# Patient Record
Sex: Male | Born: 1960 | Race: Black or African American | Hispanic: No | Marital: Married | Smoking: Never smoker
Health system: Southern US, Community
[De-identification: ages and names within clinical notes are randomized; demographics above are authoritative.]

## PROBLEM LIST (undated history)

## (undated) DIAGNOSIS — Z9289 Personal history of other medical treatment: Secondary | ICD-10-CM

## (undated) DIAGNOSIS — N189 Chronic kidney disease, unspecified: Secondary | ICD-10-CM

## (undated) DIAGNOSIS — D649 Anemia, unspecified: Secondary | ICD-10-CM

## (undated) DIAGNOSIS — E119 Type 2 diabetes mellitus without complications: Secondary | ICD-10-CM

## (undated) DIAGNOSIS — I10 Essential (primary) hypertension: Secondary | ICD-10-CM

## (undated) HISTORY — DX: Chronic kidney disease, unspecified: N18.9

## (undated) HISTORY — DX: Anemia, unspecified: D64.9

## (undated) HISTORY — DX: Personal history of other medical treatment: Z92.89

## (undated) HISTORY — DX: Type 2 diabetes mellitus without complications: E11.9

---

## 2009-03-11 ENCOUNTER — Emergency Department (HOSPITAL_COMMUNITY): Admission: EM | Admit: 2009-03-11 | Discharge: 2009-03-11 | Payer: Self-pay | Admitting: Emergency Medicine

## 2010-08-03 ENCOUNTER — Encounter: Admission: RE | Admit: 2010-08-03 | Discharge: 2010-08-03 | Payer: Self-pay | Admitting: Internal Medicine

## 2010-12-28 LAB — POCT CARDIAC MARKERS

## 2010-12-28 LAB — DIFFERENTIAL
Basophils Absolute: 0 10*3/uL (ref 0.0–0.1)
Basophils Relative: 1 % (ref 0–1)
Eosinophils Absolute: 0.3 10*3/uL (ref 0.0–0.7)
Monocytes Absolute: 0.2 10*3/uL (ref 0.1–1.0)
Monocytes Relative: 5 % (ref 3–12)
Neutrophils Relative %: 66 % (ref 43–77)

## 2010-12-28 LAB — POCT I-STAT, CHEM 8
BUN: 32 mg/dL — ABNORMAL HIGH (ref 6–23)
Creatinine, Ser: 4 mg/dL — ABNORMAL HIGH (ref 0.4–1.5)
Potassium: 3.8 mEq/L (ref 3.5–5.1)
Sodium: 139 mEq/L (ref 135–145)

## 2010-12-28 LAB — CBC
MCHC: 33.2 g/dL (ref 30.0–36.0)
MCV: 100.8 fL — ABNORMAL HIGH (ref 78.0–100.0)
RBC: 4.37 MIL/uL (ref 4.22–5.81)
RDW: 12.6 % (ref 11.5–15.5)

## 2012-09-08 ENCOUNTER — Encounter (HOSPITAL_BASED_OUTPATIENT_CLINIC_OR_DEPARTMENT_OTHER): Payer: Self-pay

## 2012-10-06 ENCOUNTER — Encounter (HOSPITAL_BASED_OUTPATIENT_CLINIC_OR_DEPARTMENT_OTHER): Payer: Self-pay

## 2013-10-18 ENCOUNTER — Other Ambulatory Visit: Payer: Self-pay | Admitting: *Deleted

## 2013-10-18 DIAGNOSIS — Z01810 Encounter for preprocedural cardiovascular examination: Secondary | ICD-10-CM

## 2013-10-18 DIAGNOSIS — N186 End stage renal disease: Secondary | ICD-10-CM

## 2013-11-05 ENCOUNTER — Encounter: Payer: Self-pay | Admitting: Vascular Surgery

## 2013-11-06 ENCOUNTER — Encounter: Payer: Self-pay | Admitting: Vascular Surgery

## 2013-11-07 ENCOUNTER — Ambulatory Visit: Payer: Self-pay | Admitting: Vascular Surgery

## 2013-11-07 ENCOUNTER — Inpatient Hospital Stay (HOSPITAL_COMMUNITY): Admission: RE | Admit: 2013-11-07 | Payer: Self-pay | Source: Ambulatory Visit

## 2013-11-08 ENCOUNTER — Ambulatory Visit (INDEPENDENT_AMBULATORY_CARE_PROVIDER_SITE_OTHER)
Admission: RE | Admit: 2013-11-08 | Discharge: 2013-11-08 | Disposition: A | Discharge status: Home / Self Care | Payer: BC Managed Care – PPO | Source: Ambulatory Visit | Attending: Vascular Surgery | Admitting: Vascular Surgery

## 2013-11-08 ENCOUNTER — Ambulatory Visit (INDEPENDENT_AMBULATORY_CARE_PROVIDER_SITE_OTHER): Payer: BC Managed Care – PPO | Admitting: Vascular Surgery

## 2013-11-08 ENCOUNTER — Encounter: Payer: Self-pay | Admitting: Vascular Surgery

## 2013-11-08 ENCOUNTER — Ambulatory Visit (HOSPITAL_COMMUNITY)
Admission: RE | Admit: 2013-11-08 | Discharge: 2013-11-08 | Disposition: A | Discharge status: Home / Self Care | Payer: BC Managed Care – PPO | Source: Ambulatory Visit | Attending: Vascular Surgery | Admitting: Vascular Surgery

## 2013-11-08 VITALS — BP 147/80 | HR 76 | Ht 70.0 in | Wt 184.0 lb

## 2013-11-08 DIAGNOSIS — Z01810 Encounter for preprocedural cardiovascular examination: Secondary | ICD-10-CM

## 2013-11-08 DIAGNOSIS — N186 End stage renal disease: Secondary | ICD-10-CM

## 2013-11-08 DIAGNOSIS — Z01818 Encounter for other preprocedural examination: Secondary | ICD-10-CM | POA: Insufficient documentation

## 2013-11-08 NOTE — Progress Notes (Signed)
[facility] [place] HISTORY AND PHYSICAL   History of Present Illness:  Patient is a 53 y.o. year old male who presents for placement of a permanent hemodialysis access. The patient is right handed .  The patient is not currently on hemodialysis.  The cause of renal failure is thought to be secondary to hypertension.  Other chronic medical problems include diabetes. These are currently stable. The patient's nephrologist is Dr. Joelyn Oms.  Past Medical History  Diagnosis Date  . Diabetes mellitus without complication   . Chronic kidney disease     History reviewed. No pertinent past surgical history.   Social History History  Substance Use Topics  . Smoking status: Never Smoker   . Smokeless tobacco: Never Used  . Alcohol Use: No    Family History History reviewed. No pertinent family history.  Allergies  No Known Allergies   Current Outpatient Prescriptions  Medication Sig Dispense Refill  . AMLODIPINE BESYLATE PO Take 1 tablet by mouth daily.      Marland Kitchen CARVEDILOL PO Take by mouth.      . CLONIDINE HCL PO Take by mouth.      . Saxagliptin HCl (ONGLYZA PO) Take by mouth.       No current facility-administered medications for this visit.    ROS:   General:  No weight loss, Fever, chills  HEENT: No recent headaches, no nasal bleeding, no visual changes, no sore throat  Neurologic: No dizziness, blackouts, seizures. No recent symptoms of stroke or mini- stroke. No recent episodes of slurred speech, or temporary blindness.  Cardiac: No recent episodes of chest pain/pressure, no shortness of breath at rest.  No shortness of breath with exertion.  Denies history of atrial fibrillation or irregular heartbeat  Vascular: No history of rest pain in feet.  No history of claudication.  No history of non-healing ulcer, No history of DVT   Pulmonary: No home oxygen, no productive cough, no hemoptysis,  No asthma or wheezing  Musculoskeletal:  [ ]  Arthritis, [  ] Low back pain,  [ ]  Joint pain  Hematologic:No history of hypercoagulable state.  No history of easy bleeding.  No history of anemia  Gastrointestinal: No hematochezia or melena,  No gastroesophageal reflux, no trouble swallowing  Urinary: [x ] chronic Kidney disease, [ ]  on HD - [ ]  MWF or [ ]  TTHS, [ ]  Burning with urination, [ ]  Frequent urination, [ ]  Difficulty urinating;   Skin: No rashes  Psychological: No history of anxiety,  No history of depression   Physical Examination  Filed Vitals:   11/08/13 1121  BP: 147/80  Pulse: 76  Height: 5\' 10"  (1.778 m)  Weight: 184 lb (83.462 kg)  SpO2: 100%    Body mass index is 26.4 kg/(m^2).  General:  Alert and oriented, no acute distress HEENT: Normal Neck: No bruit or JVD Pulmonary: Clear to auscultation bilaterally Cardiac: Regular Rate and Rhythm without murmur Gastrointestinal: Soft, non-tender, non-distended, no mass Skin: No rash Extremity Pulses:  2+ radial, brachial pulses bilaterally Musculoskeletal: No deformity or edema  Neurologic: Upper and lower extremity motor 5/5 and symmetric  DATA: Patient had a vein mapping ultrasound today. The cephalic vein is small bilaterally. The left is less than 2 mm throughout its course. The right is slightly over 2 mm but has areas which are less than 2 mm. The right basilic vein is 0000000 mm. The left basilic vein is smaller   ASSESSMENT: Lengthy discussion with the patient today  regarding options of fistula versus graft as well as the physiology and operative details behind dialysis access procedures. The patient has marginal vein bilaterally. He may be a candidate for a right basilic vein transposition. However he may require a graft.   PLAN:  The patient was offered a right basilic vein transposition fistula today versus possible AV graft. However, he is currently reluctant to have access placed and thoroughly discusses things further with his nephrologist Dr. Joelyn Oms. The  patient will call us if he wishes to schedule a right basilic vein transposition fistula or a graft in the near future.  Ruta Hinds, MD [facility] [place] Office: [phone] Pager: [phone]

## 2016-05-21 DIAGNOSIS — Z9289 Personal history of other medical treatment: Secondary | ICD-10-CM

## 2016-05-21 HISTORY — DX: Personal history of other medical treatment: Z92.89

## 2016-06-07 ENCOUNTER — Inpatient Hospital Stay (HOSPITAL_COMMUNITY)
Admission: EM | Admit: 2016-06-07 | Discharge: 2016-06-10 | DRG: 674 | Disposition: A | Discharge status: Home / Self Care | Payer: BLUE CROSS/BLUE SHIELD | Attending: Internal Medicine | Admitting: Internal Medicine

## 2016-06-07 ENCOUNTER — Encounter (HOSPITAL_COMMUNITY): Payer: Self-pay | Admitting: Vascular Surgery

## 2016-06-07 DIAGNOSIS — N186 End stage renal disease: Secondary | ICD-10-CM | POA: Diagnosis not present

## 2016-06-07 DIAGNOSIS — I120 Hypertensive chronic kidney disease with stage 5 chronic kidney disease or end stage renal disease: Secondary | ICD-10-CM | POA: Diagnosis present

## 2016-06-07 DIAGNOSIS — N179 Acute kidney failure, unspecified: Secondary | ICD-10-CM | POA: Diagnosis not present

## 2016-06-07 DIAGNOSIS — E872 Acidosis: Secondary | ICD-10-CM | POA: Diagnosis present

## 2016-06-07 DIAGNOSIS — E1122 Type 2 diabetes mellitus with diabetic chronic kidney disease: Secondary | ICD-10-CM | POA: Diagnosis not present

## 2016-06-07 DIAGNOSIS — Z23 Encounter for immunization: Secondary | ICD-10-CM | POA: Diagnosis not present

## 2016-06-07 DIAGNOSIS — Z452 Encounter for adjustment and management of vascular access device: Secondary | ICD-10-CM | POA: Diagnosis not present

## 2016-06-07 DIAGNOSIS — E875 Hyperkalemia: Secondary | ICD-10-CM | POA: Diagnosis not present

## 2016-06-07 DIAGNOSIS — N19 Unspecified kidney failure: Secondary | ICD-10-CM | POA: Diagnosis not present

## 2016-06-07 DIAGNOSIS — I10 Essential (primary) hypertension: Secondary | ICD-10-CM | POA: Diagnosis present

## 2016-06-07 DIAGNOSIS — Z419 Encounter for procedure for purposes other than remedying health state, unspecified: Secondary | ICD-10-CM

## 2016-06-07 DIAGNOSIS — E1129 Type 2 diabetes mellitus with other diabetic kidney complication: Secondary | ICD-10-CM | POA: Diagnosis not present

## 2016-06-07 DIAGNOSIS — D649 Anemia, unspecified: Secondary | ICD-10-CM | POA: Diagnosis present

## 2016-06-07 DIAGNOSIS — N189 Chronic kidney disease, unspecified: Secondary | ICD-10-CM

## 2016-06-07 DIAGNOSIS — D631 Anemia in chronic kidney disease: Secondary | ICD-10-CM | POA: Diagnosis present

## 2016-06-07 DIAGNOSIS — Z833 Family history of diabetes mellitus: Secondary | ICD-10-CM

## 2016-06-07 DIAGNOSIS — Z95828 Presence of other vascular implants and grafts: Secondary | ICD-10-CM

## 2016-06-07 DIAGNOSIS — D638 Anemia in other chronic diseases classified elsewhere: Secondary | ICD-10-CM | POA: Diagnosis not present

## 2016-06-07 DIAGNOSIS — N2581 Secondary hyperparathyroidism of renal origin: Secondary | ICD-10-CM | POA: Diagnosis present

## 2016-06-07 DIAGNOSIS — Z992 Dependence on renal dialysis: Secondary | ICD-10-CM | POA: Diagnosis not present

## 2016-06-07 DIAGNOSIS — Z9119 Patient's noncompliance with other medical treatment and regimen: Secondary | ICD-10-CM

## 2016-06-07 DIAGNOSIS — N289 Disorder of kidney and ureter, unspecified: Secondary | ICD-10-CM | POA: Diagnosis not present

## 2016-06-07 DIAGNOSIS — M549 Dorsalgia, unspecified: Secondary | ICD-10-CM | POA: Diagnosis not present

## 2016-06-07 DIAGNOSIS — N185 Chronic kidney disease, stage 5: Secondary | ICD-10-CM | POA: Diagnosis not present

## 2016-06-07 DIAGNOSIS — I129 Hypertensive chronic kidney disease with stage 1 through stage 4 chronic kidney disease, or unspecified chronic kidney disease: Secondary | ICD-10-CM | POA: Diagnosis not present

## 2016-06-07 HISTORY — DX: Essential (primary) hypertension: I10

## 2016-06-07 LAB — RENAL FUNCTION PANEL
ANION GAP: 17 — AB (ref 5–15)
Albumin: 3.3 g/dL — ABNORMAL LOW (ref 3.5–5.0)
BUN: 159 mg/dL — ABNORMAL HIGH (ref 6–20)
CALCIUM: 6.9 mg/dL — AB (ref 8.9–10.3)
CO2: 12 mmol/L — ABNORMAL LOW (ref 22–32)
CREATININE: 32.26 mg/dL — AB (ref 0.61–1.24)
Chloride: 102 mmol/L (ref 101–111)
GFR, EST AFRICAN AMERICAN: 1 mL/min — AB (ref >60)
GFR, EST NON AFRICAN AMERICAN: 1 mL/min — AB (ref >60)
Glucose, Bld: 189 mg/dL — ABNORMAL HIGH (ref 65–99)
PHOSPHORUS: 6.7 mg/dL — AB (ref 2.5–4.6)
Potassium: 4.6 mmol/L (ref 3.5–5.1)
SODIUM: 131 mmol/L — AB (ref 135–145)

## 2016-06-07 LAB — COMPREHENSIVE METABOLIC PANEL
ALT: 14 U/L — AB (ref 17–63)
AST: 11 U/L — AB (ref 15–41)
Albumin: 3.4 g/dL — ABNORMAL LOW (ref 3.5–5.0)
Alkaline Phosphatase: 110 U/L (ref 38–126)
Anion gap: 16 — ABNORMAL HIGH (ref 5–15)
BUN: 160 mg/dL — AB (ref 6–20)
CHLORIDE: 105 mmol/L (ref 101–111)
CO2: 14 mmol/L — AB (ref 22–32)
CREATININE: 31 mg/dL — AB (ref 0.61–1.24)
Calcium: 7 mg/dL — ABNORMAL LOW (ref 8.9–10.3)
GFR calc Af Amer: 2 mL/min — ABNORMAL LOW (ref >60)
GFR calc non Af Amer: 1 mL/min — ABNORMAL LOW (ref >60)
Glucose, Bld: 125 mg/dL — ABNORMAL HIGH (ref 65–99)
Potassium: 4.5 mmol/L (ref 3.5–5.1)
SODIUM: 135 mmol/L (ref 135–145)
Total Bilirubin: 0.5 mg/dL (ref 0.3–1.2)
Total Protein: 7.5 g/dL (ref 6.5–8.1)

## 2016-06-07 LAB — URINE MICROSCOPIC-ADD ON
BACTERIA UA: NONE SEEN
RBC / HPF: NONE SEEN RBC/hpf (ref 0–5)
SQUAMOUS EPITHELIAL / LPF: NONE SEEN

## 2016-06-07 LAB — BASIC METABOLIC PANEL
ANION GAP: 17 — AB (ref 5–15)
BUN: 161 mg/dL — AB (ref 6–20)
CALCIUM: 6.6 mg/dL — AB (ref 8.9–10.3)
CO2: 13 mmol/L — ABNORMAL LOW (ref 22–32)
CREATININE: 31.7 mg/dL — AB (ref 0.61–1.24)
Chloride: 103 mmol/L (ref 101–111)
GFR calc Af Amer: 1 mL/min — ABNORMAL LOW (ref >60)
GFR, EST NON AFRICAN AMERICAN: 1 mL/min — AB (ref >60)
GLUCOSE: 164 mg/dL — AB (ref 65–99)
Potassium: 5 mmol/L (ref 3.5–5.1)
Sodium: 133 mmol/L — ABNORMAL LOW (ref 135–145)

## 2016-06-07 LAB — URINALYSIS, ROUTINE W REFLEX MICROSCOPIC
Bilirubin Urine: NEGATIVE
GLUCOSE, UA: 100 mg/dL — AB
KETONES UR: NEGATIVE mg/dL
Leukocytes, UA: NEGATIVE
Nitrite: NEGATIVE
Specific Gravity, Urine: 1.01 (ref 1.005–1.030)
pH: 5.5 (ref 5.0–8.0)

## 2016-06-07 LAB — PREPARE RBC (CROSSMATCH)

## 2016-06-07 LAB — SODIUM, URINE, RANDOM: SODIUM UR: 68 mmol/L

## 2016-06-07 LAB — GLUCOSE, CAPILLARY: GLUCOSE-CAPILLARY: 177 mg/dL — AB (ref 65–99)

## 2016-06-07 LAB — CBC
HCT: 20.2 % — ABNORMAL LOW (ref 39.0–52.0)
HCT: 20.3 % — ABNORMAL LOW (ref 39.0–52.0)
Hemoglobin: 6.7 g/dL — CL (ref 13.0–17.0)
Hemoglobin: 6.8 g/dL — CL (ref 13.0–17.0)
MCH: 31.5 pg (ref 26.0–34.0)
MCH: 31.5 pg (ref 26.0–34.0)
MCHC: 33.2 g/dL (ref 30.0–36.0)
MCHC: 33.5 g/dL (ref 30.0–36.0)
MCV: 94.8 fL (ref 78.0–100.0)
MCV: 94 fL (ref 78.0–100.0)
PLATELETS: 288 10*3/uL (ref 150–400)
PLATELETS: 255 10*3/uL (ref 150–400)
RBC: 2.13 MIL/uL — ABNORMAL LOW (ref 4.22–5.81)
RBC: 2.16 MIL/uL — AB (ref 4.22–5.81)
RDW: 12.6 % (ref 11.5–15.5)
RDW: 12.6 % (ref 11.5–15.5)
WBC: 5.9 10*3/uL (ref 4.0–10.5)
WBC: 6.1 10*3/uL (ref 4.0–10.5)

## 2016-06-07 LAB — ABO/RH: ABO/RH(D): AB POS

## 2016-06-07 LAB — CK: Total CK: 542 U/L — ABNORMAL HIGH (ref 49–397)

## 2016-06-07 LAB — CREATININE, URINE, RANDOM: Creatinine, Urine: 90.27 mg/dL

## 2016-06-07 LAB — POC OCCULT BLOOD, ED: Fecal Occult Bld: NEGATIVE

## 2016-06-07 LAB — LIPASE, BLOOD: LIPASE: 316 U/L — AB (ref 11–51)

## 2016-06-07 MED ORDER — SODIUM CHLORIDE 0.9 % IV SOLN
Freq: Once | INTRAVENOUS | Status: AC
  Administered 2016-06-07: 23:00:00 via INTRAVENOUS

## 2016-06-07 MED ORDER — ALTEPLASE 2 MG IJ SOLR: 2.0000 mg | Freq: Once | INTRAMUSCULAR | Status: DC | PRN

## 2016-06-07 MED ORDER — CALCITRIOL 0.5 MCG PO CAPS: 0.5000 ug | ORAL_CAPSULE | ORAL | Status: DC

## 2016-06-07 MED ORDER — DARBEPOETIN ALFA 100 MCG/0.5ML IJ SOSY
100.0000 ug | PREFILLED_SYRINGE | INTRAMUSCULAR | Status: DC
  Filled 2016-06-07: qty 0.5

## 2016-06-07 MED ORDER — SODIUM CHLORIDE 0.9 % IV SOLN: 100.0000 mL | INTRAVENOUS | Status: DC | PRN

## 2016-06-07 MED ORDER — HEPARIN SODIUM (PORCINE) 1000 UNIT/ML DIALYSIS: 1000.0000 [IU] | INTRAMUSCULAR | Status: DC | PRN

## 2016-06-07 MED ORDER — INSULIN ASPART 100 UNIT/ML ~~LOC~~ SOLN
0.0000 [IU] | Freq: Three times a day (TID) | SUBCUTANEOUS | Status: DC
  Administered 2016-06-09: 7 [IU] via SUBCUTANEOUS
  Administered 2016-06-09: 1 [IU] via SUBCUTANEOUS
  Administered 2016-06-10: 2 [IU] via SUBCUTANEOUS

## 2016-06-07 NOTE — H&P (Signed)
History and Physical    Jonathan Horton ZWC:[id number] DOB: 1961-01-20 DOA: 06/07/2016  PCP: Philis Fendt, MD  Patient coming from: Home  Chief Complaint: weakness and nausea  HPI: Jonathan Horton is a 55 y.o. male with medical history significant of DM and essential HTN.  Pt reports that he has had weakness over the past week which has been progressively getting worse. He also reports nausea and loss of appetite. The problem has been gradual but persistent. Nothing he is aware of makes it better. Given the worsening of the problem patient presented to the hospital for further evaluation recommendations.  ED Course:  Patient was found to have anemia of 6.7 and serum creatinine of 31.7 with BUN of 161. Nephrology was subsequently consulted and we are consulted for further medical evaluation recommendations  Review of Systems: As per HPI otherwise 10 point review of systems negative.   Past Medical History:  Diagnosis Date  . Chronic kidney disease   . Diabetes mellitus without complication ([place])   . Hypertension     History reviewed. No pertinent surgical history.   reports that he has never smoked. He has never used smokeless tobacco. He reports that he does not drink alcohol or use drugs.  No Known Allergies  Family history of diabetes   Prior to Admission medications   Medication Sig Start Date End Date Taking? Authorizing Provider  amLODipine (NORVASC) 10 MG tablet Take 10 mg by mouth daily. 05/01/16  Yes Historical Provider, MD  carvedilol (COREG) 25 MG tablet Take 25 mg by mouth 2 (two) times daily. 05/01/16  Yes Historical Provider, MD  cloNIDine (CATAPRES) 0.1 MG tablet Take 0.1 mg by mouth 2 (two) times daily. 05/01/16  Yes Historical Provider, MD  pantoprazole (PROTONIX) 40 MG tablet Take 40 mg by mouth daily. 05/01/16  Yes Historical Provider, MD  traMADol (ULTRAM) 50 MG tablet Take by mouth every 6 (six) hours as needed.    Historical Provider, MD    Physical  Exam: Vitals:   06/07/16 1310 06/07/16 1651 06/07/16 1715 06/07/16 1826  BP: 168/82 171/88 170/80 172/84  Pulse: 100 92 91 92  Resp: 17 20 15 16   Temp: 98.3 F (36.8 C)     TempSrc: Oral     SpO2: 100% 100% 100% 99%      Constitutional: NAD, calm, comfortable, in nad. Vitals:   06/07/16 1310 06/07/16 1651 06/07/16 1715 06/07/16 1826  BP: 168/82 171/88 170/80 172/84  Pulse: 100 92 91 92  Resp: 17 20 15 16   Temp: 98.3 F (36.8 C)     TempSrc: Oral     SpO2: 100% 100% 100% 99%   Eyes: PERRL, lids and conjunctivae normal ENMT: Mucous membranes are dry. Posterior pharynx clear of any exudate or lesions.Normal dentition.  Neck: normal, supple, no masses, no thyromegaly Respiratory: clear to auscultation bilaterally, no wheezing, no crackles. Normal respiratory effort. No accessory muscle use.  Cardiovascular: Regular rate and rhythm, no murmurs / rubs / gallops. No extremity edema. 2+ pedal pulses. No carotid bruits.  Abdomen: no tenderness, no masses palpated. No hepatosplenomegaly. Bowel sounds positive.  Musculoskeletal: no clubbing / cyanosis. No joint deformity upper and lower extremities. Good ROM, no contractures. Normal muscle tone.  Skin: no rashes, lesions, ulcers. No induration Neurologic: Pt non focal. Sensation intact,  Strength equal in all 4 extremities.  Psychiatric: Normal judgment and insight. Alert and oriented x 3. Normal mood.    Labs on Admission: I have personally reviewed following labs and  imaging studies  CBC:  Recent Labs Lab 06/07/16 1317  WBC 5.9  HGB 6.7*  HCT 20.2*  MCV 94.8  PLT 240   Basic Metabolic Panel:  Recent Labs Lab 06/07/16 1317 06/07/16 1722  NA 135 133*  K 4.5 5.0  CL 105 103  CO2 14* 13*  GLUCOSE 125* 164*  BUN 160* 161*  CREATININE 31.00* 31.70*  CALCIUM 7.0* 6.6*   GFR: CrCl cannot be calculated (Unknown ideal weight.). Liver Function Tests:  Recent Labs Lab 06/07/16 1317  AST 11*  ALT 14*  ALKPHOS 110   BILITOT 0.5  PROT 7.5  ALBUMIN 3.4*    Recent Labs Lab 06/07/16 1317  LIPASE 316*   No results for input(s): AMMONIA in the last 168 hours. Coagulation Profile: No results for input(s): INR, PROTIME in the last 168 hours. Cardiac Enzymes:  Recent Labs Lab 06/07/16 1722  CKTOTAL 542*   BNP (last 3 results) No results for input(s): PROBNP in the last 8760 hours. HbA1C: No results for input(s): HGBA1C in the last 72 hours. CBG: No results for input(s): GLUCAP in the last 168 hours. Lipid Profile: No results for input(s): CHOL, HDL, LDLCALC, TRIG, CHOLHDL, LDLDIRECT in the last 72 hours. Thyroid Function Tests: No results for input(s): TSH, T4TOTAL, FREET4, T3FREE, THYROIDAB in the last 72 hours. Anemia Panel: No results for input(s): VITAMINB12, FOLATE, FERRITIN, TIBC, IRON, RETICCTPCT in the last 72 hours. Urine analysis:    Component Value Date/Time   COLORURINE YELLOW 06/07/2016 [place] 06/07/2016 1727   LABSPEC 1.010 06/07/2016 1727   PHURINE 5.5 06/07/2016 1727   GLUCOSEU 100 (A) 06/07/2016 1727   HGBUR SMALL (A) 06/07/2016 1727   BILIRUBINUR NEGATIVE 06/07/2016 1727   KETONESUR NEGATIVE 06/07/2016 1727   PROTEINUR >300 (A) 06/07/2016 1727   NITRITE NEGATIVE 06/07/2016 1727   LEUKOCYTESUR NEGATIVE 06/07/2016 1727   Sepsis Labs: !!!!!!!!!!!!!!!!!!!!!!!!!!!!!!!!!!!!!!!!!!!! @LABRCNTIP (procalcitonin:4,lacticidven:4) )No results found for this or any previous visit (from the past 240 hour(s)).   Radiological Exams on Admission: No results found.  EKG: pending  Assessment/Plan Active Problems:   Renal failure/  Acute renal failure (ARF) ([place]) - New problem. Suspect acute on chronic.  - Nephrology consulted - Renal ultrasound - Obtain FE Na  Anemia - most likely due to renal failure - We'll transfuse 1 unit of packed red blood cells slowly.  - Should patient develop shortness of breath will discontinue and have nursing page  nephrology    Essential hypertension - continue home medication regimen    Diabetes mellitus ([place])  DVT prophylaxis: Heparin Code Status: Full Family Communication: d/c spouse at bedside Disposition Plan: Telemetry Consults called: Nephrology Admission status: inpatient   Velvet Bathe MD [facility] Pager [phone]  If 7PM-7AM, please contact night-coverage [email or web address] Password [facility]  06/07/2016, 6:33 PM

## 2016-06-07 NOTE — Consult Note (Signed)
Jonathan Horton Admit Date: 06/07/2016 06/07/2016 Rexene Agent Requesting Physician:  Wendee Beavers MD  Reason for Consult:  Renal Failure, Malaise, fatigue HPI:  44M seen at request of Dr Wendee Beavers for evaluation of renal failure.  He is known to me and was last seen by me in office 08/20/14.  At that time his SCr was 8.5.  He was not uremic. We had a repeat conversation where he was urged to proactively plan for impending symptomatic renal failure.  He chose against dialysis access. He stated he was working to lose some weight and he didn't think his kidneys would failed. I cautioned him that he would end up receiving 'crash start dialysis' if no active planning was pursued. He did not come to the scheduled f/u visits.    He presented to ED today with malaise, fatigue, cramps, and anorexia. Some nausea, vomiting.  No swelling in legs. No chest pain.  Labs are as below.  Noteworthy for profound progressive renal failure, anemia, metabolic acidosis, hypocalcemia.    Pt now willing to proceed with HD and vascular access.  He worries how it will impact his ability to return to [place] to visit.    Creatinine, Ser (mg/dL)  Date Value  06/07/2016 31.70 (H)  06/07/2016 31.00 (H)  03/11/2009 4.0 (H)  ] I/Os:  ROS Balance of 12 systems is negative w/ exceptions as above  PMH  Past Medical History:  Diagnosis Date  . Chronic kidney disease   . Diabetes mellitus without complication ([place])   . Hypertension    PSH History reviewed. No pertinent surgical history. FH No family history on file. SH  reports that he has never smoked. He has never used smokeless tobacco. He reports that he does not drink alcohol or use drugs. Allergies No Known Allergies Home medications Prior to Admission medications   Medication Sig Start Date End Date Taking? Authorizing Provider  amLODipine (NORVASC) 10 MG tablet Take 10 mg by mouth daily. 05/01/16  Yes Historical Provider, MD  carvedilol (COREG) 25 MG tablet Take 25 mg by  mouth 2 (two) times daily. 05/01/16  Yes Historical Provider, MD  cloNIDine (CATAPRES) 0.1 MG tablet Take 0.1 mg by mouth 2 (two) times daily. 05/01/16  Yes Historical Provider, MD  pantoprazole (PROTONIX) 40 MG tablet Take 40 mg by mouth daily. 05/01/16  Yes Historical Provider, MD  traMADol (ULTRAM) 50 MG tablet Take by mouth every 6 (six) hours as needed.    Historical Provider, MD    Current Medications Scheduled Meds: . [START ON 06/08/2016] insulin aspart  0-9 Units Subcutaneous TID WC   Continuous Infusions: . sodium chloride     PRN Meds:.  CBC  Recent Labs Lab 06/07/16 1317  WBC 5.9  HGB 6.7*  HCT 20.2*  MCV 94.8  PLT 025   Basic Metabolic Panel  Recent Labs Lab 06/07/16 1317 06/07/16 1722  NA 135 133*  K 4.5 5.0  CL 105 103  CO2 14* 13*  GLUCOSE 125* 164*  BUN 160* 161*  CREATININE 31.00* 31.70*  CALCIUM 7.0* 6.6*    Physical Exam  Blood pressure 165/71, pulse 92, temperature 98.3 F (36.8 C), temperature source Oral, resp. rate 16, SpO2 100 %. GEN: NAD ENT: uremic fetor,  EYES: EOMI CV: no rub. RRR. Nl s1s2. 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  1. New ESRD without vascular access, plan for iHD 2. Uremia 3. ANemia, severe 2/2 renal failure, ? Fe deficienc 4. Hyperkalemia, mild 5. Metabolic Acidosis, mild 6. HTN 7. DM2  Plan 1. Pt now agreeable to start HD 2. Have asked [facility] for assistance for [facility] + AVF 3. 1st HD tomorrow after TDC: 2h, 2K, 3.5Ca, Qb 200, No UF, No heparin 4. Clip patient 5. Check PTH 6. Check Fe Panel 7. Start VDRA for hypocalcemia 8. K and acidosis will correct with HD 9. 1 unit pRBC ordered already, can transfuse further in HD as needed 10. Start ESA with HD  Pearson Grippe MD [phone] pgr 06/07/2016, 7:11 PM

## 2016-06-07 NOTE — ED Triage Notes (Addendum)
Pt reports to the ED for eval of multiple complaints. States he has been having decreased PO intake, altered taste (x1.5 weeks), and N/V (x4 days). Pt also reports a yellow/white film to his tongue. Pt is a diabetic. Pt also reports bilateral shoulder pain and low back pain x 1 week as well. Denies any injury.

## 2016-06-07 NOTE — Consult Note (Signed)
   Patient name: Jonathan Horton MRN: [id number] DOB: [birth year] Sex: male    HPI: Zaeden Bohlman is a 55 y.o. male seen to discuss hemodialysis access. He had a long history of progressive renal insufficiency and his been noncompliant regarding recommendations for follow-up. He presents now in renal failure. Dr. Joelyn Oms requested hemodialysis catheter placement tomorrow and then planning for long-term access. The patient has had no prior renal access.  Past medical history is significant for diabetes and hypertension  Current Facility-Administered Medications  Medication Dose Route Frequency Provider Last Rate Last Dose  . 0.9 %  sodium chloride infusion   Intravenous Once Velvet Bathe, MD      . 0.9 %  sodium chloride infusion  100 mL Intravenous PRN Rexene Agent, MD      . 0.9 %  sodium chloride infusion  100 mL Intravenous PRN Rexene Agent, MD      . alteplase (CATHFLO ACTIVASE) injection 2 mg  2 mg Intracatheter Once PRN Rexene Agent, MD      . Derrill Memo ON 06/08/2016] calcitRIOL (ROCALTROL) capsule 0.5 mcg  0.5 mcg Oral Q T,Th,Sa-HD Rexene Agent, MD      . Derrill Memo ON 06/08/2016] Darbepoetin Alfa (ARANESP) injection 100 mcg  100 mcg Intravenous Q Tue-HD Rexene Agent, MD      . heparin injection 1,000 Units  1,000 Units Dialysis PRN Rexene Agent, MD      . Derrill Memo ON 06/08/2016] insulin aspart (novoLOG) injection 0-9 Units  0-9 Units Subcutaneous TID WC Velvet Bathe, MD         PHYSICAL EXAM: Vitals:   06/07/16 1845 06/07/16 1915 06/07/16 1945 06/07/16 2035  BP: 165/71 152/72 162/87 (!) 175/86  Pulse: 92 93 105 97  Resp: 16 15 19 18   Temp:    98.5 F (36.9 C)  TempSrc:    Oral  SpO2: 100% 99% 100% 100%  Weight:    177 lb 7.5 oz (80.5 kg)  Height:    5\' 10"  (1.778 m)    GENERAL: The patient is a well-nourished male, in no acute distress. The vital signs are documented above Respirations nonlabored. Abdomen soft and nontender. Pulse status: 2+  radial 2+ popliteal and 2+ dorsalis pedis pulses bilaterally  Does have the easily visible cephalic vein in his forearm on the left. Has an IV in his cephalic vein on the right  MEDICAL ISSUES: Acute renal failure on chronic renal failure. Will place hemodialysis catheter tomorrow. Discussed this at length with the patient and his family present. Also discussed the need for long-term access. He is right handed. Will obtain vein mapping but on physical exam does appear to be a good candidate for left cephalic vein to radial artery fistula.   Rosetta Posner, MD FACS [facility] Tel [phone] Pager [phone]

## 2016-06-07 NOTE — ED Provider Notes (Signed)
[facility] DEPT Provider Note   CSN: [id number] Arrival date & time: 06/07/16  1157   History   Chief Complaint Chief Complaint  Patient presents with  . Emesis  . Arm Pain  . Back Pain    HPI Jonathan Horton is a 55 y.o. male.  The history is provided by the patient, the spouse and medical records.   55 year old male with reported history of hypertension, diabetes, CKD presenting with myalgias. Onset about a week ago. Located in back and all extremities. Moderate to severe. Described as sore and stiff. Alleviated by taking BC powder. Patient states he takes [facility] powder very sparingly, at most one every few days. Associated with dark urine, nausea, and to episodes of nonbloody nonbilious emesis that occurred a week ago, but none since. Denies abdominal pain, fevers, palpitations, chest pain, shortness of breath, leg swelling. 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 Denies poor fluid intake or recent travel. Denies any recent changes to his medications in the last several months. Other than the occasional BC powder every few days, he does not take over-the-counter medications. He states he has elevated creatinine but this was last checked a couple years ago. Has never had dialysis.   Past Medical History:  Diagnosis Date  . Chronic kidney disease   . Diabetes mellitus without complication ([place])   . Hypertension     Patient Active Problem List   Diagnosis Date Noted  . Renal failure 06/07/2016  . End stage renal disease ([place]) 11/08/2013    History reviewed. No pertinent surgical history.     Home Medications    Prior to Admission medications   Medication Sig Start Date End Date Taking? Authorizing Provider  amLODipine (NORVASC) 10 MG tablet Take 10 mg by mouth daily. 05/01/16  Yes Historical Provider, MD  carvedilol (COREG) 25 MG tablet Take 25 mg by mouth 2 (two) times daily. 05/01/16  Yes Historical Provider, MD    cloNIDine (CATAPRES) 0.1 MG tablet Take 0.1 mg by mouth 2 (two) times daily. 05/01/16  Yes Historical Provider, MD  pantoprazole (PROTONIX) 40 MG tablet Take 40 mg by mouth daily. 05/01/16  Yes Historical Provider, MD    Family History No family history on file.  Social History Social History  Substance Use Topics  . Smoking status: Never Smoker  . Smokeless tobacco: Never Used  . Alcohol use No     Allergies   Review of patient's allergies indicates no known allergies.   Review of Systems Review of Systems  Constitutional: Negative for chills and fever.  Respiratory: Negative for cough and shortness of breath.   Cardiovascular: Negative for chest pain, palpitations and leg swelling.  Gastrointestinal: Positive for nausea and vomiting. Negative for abdominal pain.  Genitourinary: Negative for decreased urine volume.  Musculoskeletal: Positive for back pain and myalgias.  Skin: Negative for rash.  Hematological: Does not bruise/bleed easily.  Psychiatric/Behavioral: Negative for confusion.  All other systems reviewed and are negative.   Physical Exam Updated Vital Signs BP 170/80   Pulse 91   Temp 98.3 F (36.8 C) (Oral)   Resp 15   SpO2 100%   Physical Exam  Constitutional: He appears well-developed and well-nourished.  HENT:  Head: Normocephalic and atraumatic.  Eyes: Conjunctivae are normal.  Neck: Neck supple.  Cardiovascular: Normal rate and regular rhythm.   Pulmonary/Chest: Effort normal and breath sounds normal. No respiratory distress.  Abdominal: Soft. There is no  tenderness.  Musculoskeletal: He exhibits no edema or tenderness (nontender to palpation of back or extremities ).  Neurological: He is alert.  Skin: Skin is warm and dry.  Faint uremic frost, most noticeable on lower extremities   Psychiatric: He has a normal mood and affect.  Nursing note and vitals reviewed.   ED Treatments / Results  Labs (all labs ordered are listed, but only  abnormal results are displayed) Labs Reviewed  LIPASE, BLOOD - Abnormal; Notable for the following:       Result Value   Lipase 316 (*)    All other components within normal limits  COMPREHENSIVE METABOLIC PANEL - Abnormal; Notable for the following:    CO2 14 (*)    Glucose, Bld 125 (*)    BUN 160 (*)    Creatinine, Ser 31.00 (*)    Calcium 7.0 (*)    Albumin 3.4 (*)    AST 11 (*)    ALT 14 (*)    GFR calc non Af Amer 1 (*)    GFR calc Af Amer 2 (*)    Anion gap 16 (*)    All other components within normal limits  CBC - Abnormal; Notable for the following:    RBC 2.13 (*)    Hemoglobin 6.7 (*)    HCT 20.2 (*)    All other components within normal limits  URINALYSIS, ROUTINE W REFLEX MICROSCOPIC (NOT AT [facility])  CK  BASIC METABOLIC PANEL  POC OCCULT BLOOD, ED    EKG  EKG Interpretation None       Radiology No results found.  Procedures Procedures (including critical care time)  Medications Ordered in ED Medications - No data to display   Initial Impression / Assessment and Plan / ED Course  I have reviewed the triage vital signs and the nursing notes.  Pertinent labs & imaging results that were available during my care of the patient were reviewed by me and considered in my medical decision making (see chart for details).  Clinical Course    55 year old male with reported history of hypertension, diabetes, CKD presenting with myalgias, as above. He is afebrile, hypertensive, other vitals stable. Labs notable for CO2 14, normal potassium at 4.5, BUN 160, creatinine 31, hemoglobin 6.7, concerning for likely chronic worsening renal failure. He will need dialysis in the near future, though not emergently as he is not acutely fluid overloaded and does not have hyperkalemia. Discussed case with Dr. Joelyn Oms, nephrologist on-call. We'll admit for further care. Admitted to hospitalist service.  Case discussed with Dr. Sabra Heck who oversaw management of this  patient.    Final Clinical Impressions(s) / ED Diagnoses   Final diagnoses:  Renal failure    New Prescriptions New Prescriptions   No medications on file     Ivin Booty, MD 06/07/16 2123    Noemi Chapel, MD 06/07/16 2226

## 2016-06-07 NOTE — ED Provider Notes (Signed)
I saw and evaluated the patient, reviewed the resident's note and I agree with the findings and plan.  Pertinent History: The patient needed dialysis access 2 years ago because of worsening renal failure, he declined that at that time. He has been doing overall okay until approximately one week ago when he started becoming more fatigued and generally weak. He presents today with dark urine, generalized fatigue and weakness  Pertinent Exam findings: On exam the patient has a soft nontender abdomen, he has a slight uremic frost, he has no peripheral edema, no abdominal tenderness, clear lung sounds.  The patient's labs aren't significantly abnormal with acute renal failure, fulminant renal failure with a creatinine of 31 and a BUN of 160. Nephrology was consulted for dialysis and hospitalist for admission. There is no hypokalemia or signs of pulmonary edema    Final diagnoses:  Renal failure      Noemi Chapel, MD 06/07/16 2226

## 2016-06-08 ENCOUNTER — Inpatient Hospital Stay (HOSPITAL_COMMUNITY): Payer: BLUE CROSS/BLUE SHIELD

## 2016-06-08 ENCOUNTER — Inpatient Hospital Stay (HOSPITAL_COMMUNITY): Payer: BLUE CROSS/BLUE SHIELD | Admitting: Anesthesiology

## 2016-06-08 ENCOUNTER — Other Ambulatory Visit: Payer: Self-pay | Admitting: *Deleted

## 2016-06-08 ENCOUNTER — Encounter (HOSPITAL_COMMUNITY)
Admission: EM | Disposition: A | Discharge status: Home / Self Care | Payer: Self-pay | Source: Home / Self Care | Attending: Family Medicine

## 2016-06-08 DIAGNOSIS — Z4931 Encounter for adequacy testing for hemodialysis: Secondary | ICD-10-CM

## 2016-06-08 DIAGNOSIS — N186 End stage renal disease: Secondary | ICD-10-CM

## 2016-06-08 HISTORY — PX: INSERTION OF DIALYSIS CATHETER: SHX1324

## 2016-06-08 HISTORY — PX: AV FISTULA PLACEMENT: SHX1204

## 2016-06-08 LAB — HEPATITIS B SURFACE ANTIGEN: Hepatitis B Surface Ag: NEGATIVE

## 2016-06-08 LAB — BASIC METABOLIC PANEL
Anion gap: 17 — ABNORMAL HIGH (ref 5–15)
BUN: 161 mg/dL — ABNORMAL HIGH (ref 6–20)
CHLORIDE: 102 mmol/L (ref 101–111)
CO2: 14 mmol/L — ABNORMAL LOW (ref 22–32)
Calcium: 7 mg/dL — ABNORMAL LOW (ref 8.9–10.3)
Creatinine, Ser: 31.95 mg/dL — ABNORMAL HIGH (ref 0.61–1.24)
GFR calc non Af Amer: 1 mL/min — ABNORMAL LOW (ref >60)
GFR, EST AFRICAN AMERICAN: 1 mL/min — AB (ref >60)
Glucose, Bld: 275 mg/dL — ABNORMAL HIGH (ref 65–99)
POTASSIUM: 4.9 mmol/L (ref 3.5–5.1)
SODIUM: 133 mmol/L — AB (ref 135–145)

## 2016-06-08 LAB — CBC
HEMATOCRIT: 21.9 % — AB (ref 39.0–52.0)
HEMOGLOBIN: 7.5 g/dL — AB (ref 13.0–17.0)
MCH: 30.6 pg (ref 26.0–34.0)
MCHC: 34.2 g/dL (ref 30.0–36.0)
MCV: 89.4 fL (ref 78.0–100.0)
Platelets: 234 10*3/uL (ref 150–400)
RBC: 2.45 MIL/uL — AB (ref 4.22–5.81)
RDW: 16.6 % — ABNORMAL HIGH (ref 11.5–15.5)
WBC: 5.8 10*3/uL (ref 4.0–10.5)

## 2016-06-08 LAB — IRON AND TIBC
IRON: 72 ug/dL (ref 45–182)
SATURATION RATIOS: 32 % (ref 17.9–39.5)
TIBC: 224 ug/dL — AB (ref 250–450)
UIBC: 152 ug/dL

## 2016-06-08 LAB — FERRITIN: Ferritin: 206 ng/mL (ref 24–336)

## 2016-06-08 LAB — SURGICAL PCR SCREEN
MRSA, PCR: NEGATIVE
Staphylococcus aureus: POSITIVE — AB

## 2016-06-08 LAB — GLUCOSE, CAPILLARY
GLUCOSE-CAPILLARY: 97 mg/dL (ref 65–99)
GLUCOSE-CAPILLARY: 106 mg/dL — AB (ref 65–99)
Glucose-Capillary: 183 mg/dL — ABNORMAL HIGH (ref 65–99)

## 2016-06-08 SURGERY — INSERTION OF DIALYSIS CATHETER
Anesthesia: Monitor Anesthesia Care | Site: Chest | Laterality: Right

## 2016-06-08 MED ORDER — PROPOFOL 10 MG/ML IV BOLUS
INTRAVENOUS | Status: AC
  Filled 2016-06-08: qty 20

## 2016-06-08 MED ORDER — SODIUM CHLORIDE 0.9 % IV SOLN
INTRAVENOUS | Status: DC
  Administered 2016-06-08: 10:00:00 via INTRAVENOUS

## 2016-06-08 MED ORDER — INFLUENZA VAC SPLIT QUAD 0.5 ML IM SUSY
0.5000 mL | PREFILLED_SYRINGE | INTRAMUSCULAR | Status: AC
  Administered 2016-06-09: 0.5 mL via INTRAMUSCULAR
  Filled 2016-06-08: qty 0.5

## 2016-06-08 MED ORDER — PANTOPRAZOLE SODIUM 40 MG PO TBEC
40.0000 mg | DELAYED_RELEASE_TABLET | Freq: Every day | ORAL | Status: DC
  Administered 2016-06-09 – 2016-06-10 (×2): 40 mg via ORAL
  Filled 2016-06-08 (×2): qty 1

## 2016-06-08 MED ORDER — CHLORHEXIDINE GLUCONATE CLOTH 2 % EX PADS
6.0000 | MEDICATED_PAD | Freq: Every day | CUTANEOUS | Status: DC
  Administered 2016-06-08 – 2016-06-10 (×3): 6 via TOPICAL

## 2016-06-08 MED ORDER — IOPAMIDOL (ISOVUE-300) INJECTION 61%
INTRAVENOUS | Status: AC
  Filled 2016-06-08: qty 50

## 2016-06-08 MED ORDER — CALCIUM ACETATE (PHOS BINDER) 667 MG PO CAPS
667.0000 mg | ORAL_CAPSULE | Freq: Three times a day (TID) | ORAL | Status: DC
  Administered 2016-06-08 – 2016-06-10 (×4): 667 mg via ORAL
  Filled 2016-06-08 (×4): qty 1

## 2016-06-08 MED ORDER — DEXTROSE 5 % IV SOLN
1.5000 g | Freq: Once | INTRAVENOUS | Status: AC
  Administered 2016-06-08: 1.5 g via INTRAVENOUS
  Filled 2016-06-08: qty 1.5

## 2016-06-08 MED ORDER — HEPARIN SODIUM (PORCINE) 1000 UNIT/ML IJ SOLN
INTRAMUSCULAR | Status: DC | PRN
  Administered 2016-06-08: 4000 [IU] via INTRAVENOUS

## 2016-06-08 MED ORDER — HYDRALAZINE HCL 20 MG/ML IJ SOLN
5.0000 mg | Freq: Four times a day (QID) | INTRAMUSCULAR | Status: DC | PRN
  Administered 2016-06-08: 5 mg via INTRAVENOUS
  Filled 2016-06-08: qty 1

## 2016-06-08 MED ORDER — PHENYLEPHRINE 40 MCG/ML (10ML) SYRINGE FOR IV PUSH (FOR BLOOD PRESSURE SUPPORT)
PREFILLED_SYRINGE | INTRAVENOUS | Status: AC
  Filled 2016-06-08: qty 10

## 2016-06-08 MED ORDER — SODIUM CHLORIDE 0.9% FLUSH
3.0000 mL | Freq: Two times a day (BID) | INTRAVENOUS | Status: DC
  Administered 2016-06-08 – 2016-06-09 (×2): 3 mL via INTRAVENOUS

## 2016-06-08 MED ORDER — SODIUM CHLORIDE 0.9 % IV SOLN
INTRAVENOUS | Status: DC | PRN
  Administered 2016-06-08: 500 mL

## 2016-06-08 MED ORDER — HEPARIN SODIUM (PORCINE) 1000 UNIT/ML IJ SOLN
INTRAMUSCULAR | Status: AC
  Filled 2016-06-08: qty 1

## 2016-06-08 MED ORDER — PHENYLEPHRINE HCL 10 MG/ML IJ SOLN
INTRAMUSCULAR | Status: DC | PRN
  Administered 2016-06-08 (×2): 80 ug via INTRAVENOUS

## 2016-06-08 MED ORDER — AMLODIPINE BESYLATE 10 MG PO TABS
10.0000 mg | ORAL_TABLET | Freq: Every day | ORAL | Status: DC
  Administered 2016-06-09 – 2016-06-10 (×2): 10 mg via ORAL
  Filled 2016-06-08 (×2): qty 1

## 2016-06-08 MED ORDER — MIDAZOLAM HCL 2 MG/2ML IJ SOLN
INTRAMUSCULAR | Status: AC
  Filled 2016-06-08: qty 2

## 2016-06-08 MED ORDER — RENA-VITE PO TABS
1.0000 | ORAL_TABLET | Freq: Every day | ORAL | Status: DC
Start: 2016-06-08 — End: 2016-06-10
  Administered 2016-06-08 – 2016-06-09 (×2): 1 via ORAL
  Filled 2016-06-08 (×2): qty 1

## 2016-06-08 MED ORDER — HEPARIN SODIUM (PORCINE) 5000 UNIT/ML IJ SOLN
5000.0000 [IU] | Freq: Three times a day (TID) | INTRAMUSCULAR | Status: DC
  Administered 2016-06-08 – 2016-06-09 (×4): 5000 [IU] via SUBCUTANEOUS
  Filled 2016-06-08 (×4): qty 1

## 2016-06-08 MED ORDER — PNEUMOCOCCAL VAC POLYVALENT 25 MCG/0.5ML IJ INJ
0.5000 mL | INJECTION | INTRAMUSCULAR | Status: AC
  Administered 2016-06-09: 0.5 mL via INTRAMUSCULAR
  Filled 2016-06-08: qty 0.5

## 2016-06-08 MED ORDER — SODIUM CHLORIDE 0.9 % IV SOLN
250.0000 mL | INTRAVENOUS | Status: DC | PRN
  Administered 2016-06-08 (×2): via INTRAVENOUS

## 2016-06-08 MED ORDER — CARVEDILOL 25 MG PO TABS
25.0000 mg | ORAL_TABLET | Freq: Two times a day (BID) | ORAL | Status: DC
  Administered 2016-06-08 – 2016-06-10 (×5): 25 mg via ORAL
  Filled 2016-06-08 (×5): qty 1

## 2016-06-08 MED ORDER — PROTAMINE SULFATE 10 MG/ML IV SOLN
INTRAVENOUS | Status: DC | PRN
  Administered 2016-06-08: 20 mg via INTRAVENOUS

## 2016-06-08 MED ORDER — PROPOFOL 500 MG/50ML IV EMUL
INTRAVENOUS | Status: DC | PRN
  Administered 2016-06-08: 50 ug/kg/min via INTRAVENOUS

## 2016-06-08 MED ORDER — FENTANYL CITRATE (PF) 100 MCG/2ML IJ SOLN
INTRAMUSCULAR | Status: AC
  Filled 2016-06-08: qty 2

## 2016-06-08 MED ORDER — LIDOCAINE 2% (20 MG/ML) 5 ML SYRINGE
INTRAMUSCULAR | Status: AC
  Filled 2016-06-08: qty 15

## 2016-06-08 MED ORDER — CLONIDINE HCL 0.1 MG PO TABS
0.1000 mg | ORAL_TABLET | Freq: Two times a day (BID) | ORAL | Status: DC
  Administered 2016-06-08 – 2016-06-10 (×5): 0.1 mg via ORAL
  Filled 2016-06-08 (×5): qty 1

## 2016-06-08 MED ORDER — HEPARIN SODIUM (PORCINE) 1000 UNIT/ML IJ SOLN
INTRAMUSCULAR | Status: DC | PRN
  Administered 2016-06-08: 3.4 mL

## 2016-06-08 MED ORDER — MUPIROCIN 2 % EX OINT
1.0000 "application " | TOPICAL_OINTMENT | Freq: Two times a day (BID) | CUTANEOUS | Status: DC
  Administered 2016-06-08 – 2016-06-10 (×6): 1 via NASAL
  Filled 2016-06-08 (×2): qty 22

## 2016-06-08 MED ORDER — SODIUM CHLORIDE 0.9% FLUSH: 3.0000 mL | INTRAVENOUS | Status: DC | PRN

## 2016-06-08 MED ORDER — OXYCODONE HCL 5 MG PO TABS: 5.0000 mg | ORAL_TABLET | Freq: Four times a day (QID) | ORAL | Status: DC | PRN

## 2016-06-08 MED ORDER — TRAMADOL HCL 50 MG PO TABS: 50.0000 mg | ORAL_TABLET | Freq: Four times a day (QID) | ORAL | Status: DC | PRN

## 2016-06-08 MED ORDER — EPHEDRINE SULFATE 50 MG/ML IJ SOLN
INTRAMUSCULAR | Status: DC | PRN
  Administered 2016-06-08: 5 mg via INTRAVENOUS

## 2016-06-08 MED ORDER — LIDOCAINE-EPINEPHRINE (PF) 1 %-1:200000 IJ SOLN
INTRAMUSCULAR | Status: AC
  Filled 2016-06-08: qty 30

## 2016-06-08 MED ORDER — LIDOCAINE-EPINEPHRINE (PF) 1 %-1:200000 IJ SOLN
INTRAMUSCULAR | Status: DC | PRN
  Administered 2016-06-08: 10 mL

## 2016-06-08 MED ORDER — FENTANYL CITRATE (PF) 100 MCG/2ML IJ SOLN
INTRAMUSCULAR | Status: DC | PRN
  Administered 2016-06-08: 50 ug via INTRAVENOUS
  Administered 2016-06-08 (×2): 25 ug via INTRAVENOUS

## 2016-06-08 MED ORDER — 0.9 % SODIUM CHLORIDE (POUR BTL) OPTIME
TOPICAL | Status: DC | PRN
  Administered 2016-06-08: 1000 mL

## 2016-06-08 MED ORDER — OXYCODONE-ACETAMINOPHEN 5-325 MG PO TABS
1.0000 | ORAL_TABLET | ORAL | Status: DC | PRN
  Administered 2016-06-08 – 2016-06-09 (×2): 1 via ORAL
  Administered 2016-06-09: 2 via ORAL
  Filled 2016-06-08: qty 2
  Filled 2016-06-08 (×2): qty 1

## 2016-06-08 MED ORDER — SODIUM CHLORIDE 0.9% FLUSH
3.0000 mL | Freq: Two times a day (BID) | INTRAVENOUS | Status: DC
  Administered 2016-06-08 – 2016-06-10 (×5): 3 mL via INTRAVENOUS

## 2016-06-08 MED ORDER — EPHEDRINE 5 MG/ML INJ
INTRAVENOUS | Status: AC
  Filled 2016-06-08: qty 20

## 2016-06-08 MED ORDER — MIDAZOLAM HCL 5 MG/5ML IJ SOLN
INTRAMUSCULAR | Status: DC | PRN
  Administered 2016-06-08: 2 mg via INTRAVENOUS

## 2016-06-08 SURGICAL SUPPLY — 46 items
BAG DECANTER FOR FLEXI CONT (MISCELLANEOUS) ×4 IMPLANT
BIOPATCH RED 1 DISK 7.0 (GAUZE/BANDAGES/DRESSINGS) ×3 IMPLANT
BIOPATCH RED 1IN DISK 7.0MM (GAUZE/BANDAGES/DRESSINGS) ×1
CATH PALINDROME RT-P 15FX19CM (CATHETERS) IMPLANT
CATH PALINDROME RT-P 15FX23CM (CATHETERS) ×2 IMPLANT
CATH PALINDROME RT-P 15FX28CM (CATHETERS) IMPLANT
CATH PALINDROME RT-P 15FX55CM (CATHETERS) IMPLANT
CHLORAPREP W/TINT 26ML (MISCELLANEOUS) ×4 IMPLANT
CLIP TI MEDIUM 6 (CLIP) ×2 IMPLANT
CLIP TI WIDE RED SMALL 6 (CLIP) ×4 IMPLANT
COVER PROBE W GEL 5X96 (DRAPES) ×2 IMPLANT
COVER SURGICAL LIGHT HANDLE (MISCELLANEOUS) ×4 IMPLANT
DRAPE C-ARM 42X72 X-RAY (DRAPES) ×4 IMPLANT
DRAPE CHEST BREAST 15X10 FENES (DRAPES) ×4 IMPLANT
GAUZE SPONGE 2X2 8PLY STRL LF (GAUZE/BANDAGES/DRESSINGS) IMPLANT
GAUZE SPONGE 4X4 16PLY XRAY LF (GAUZE/BANDAGES/DRESSINGS) ×4 IMPLANT
GLOVE BIO SURGEON STRL SZ7.5 (GLOVE) ×6 IMPLANT
GLOVE BIOGEL PI IND STRL 8 (GLOVE) ×2 IMPLANT
GLOVE BIOGEL PI INDICATOR 8 (GLOVE) ×2
GOWN STRL REUS W/ TWL LRG LVL3 (GOWN DISPOSABLE) ×4 IMPLANT
GOWN STRL REUS W/TWL LRG LVL3 (GOWN DISPOSABLE) ×8
KIT BASIN OR (CUSTOM PROCEDURE TRAY) ×4 IMPLANT
KIT ROOM TURNOVER OR (KITS) ×4 IMPLANT
LIQUID BAND (GAUZE/BANDAGES/DRESSINGS) ×4 IMPLANT
NDL 18GX1X1/2 (RX/OR ONLY) (NEEDLE) ×2 IMPLANT
NDL HYPO 25GX1X1/2 BEV (NEEDLE) ×2 IMPLANT
NEEDLE 18GX1X1/2 (RX/OR ONLY) (NEEDLE) ×4 IMPLANT
NEEDLE 22X1 1/2 (OR ONLY) (NEEDLE) ×4 IMPLANT
NEEDLE HYPO 25GX1X1/2 BEV (NEEDLE) ×8 IMPLANT
NS IRRIG 1000ML POUR BTL (IV SOLUTION) ×4 IMPLANT
PACK SURGICAL SETUP 50X90 (CUSTOM PROCEDURE TRAY) ×2 IMPLANT
PAD ARMBOARD 7.5X6 YLW CONV (MISCELLANEOUS) ×8 IMPLANT
SET MICROPUNCTURE 5F STIFF (MISCELLANEOUS) ×2 IMPLANT
SPONGE GAUZE 2X2 STER 10/PKG (GAUZE/BANDAGES/DRESSINGS)
SPONGE GAUZE 4X4 12PLY STER LF (GAUZE/BANDAGES/DRESSINGS) ×2 IMPLANT
SUT ETHILON 3 0 PS 1 (SUTURE) ×4 IMPLANT
SUT PROLENE 6 0 BV (SUTURE) ×4 IMPLANT
SUT VIC AB 3-0 SH 27 (SUTURE) ×8
SUT VIC AB 3-0 SH 27X BRD (SUTURE) IMPLANT
SUT VICRYL 4-0 PS2 18IN ABS (SUTURE) ×8 IMPLANT
SYR 20CC LL (SYRINGE) ×8 IMPLANT
SYR 5ML LL (SYRINGE) ×8 IMPLANT
SYR CONTROL 10ML LL (SYRINGE) ×6 IMPLANT
SYRINGE 10CC LL (SYRINGE) ×4 IMPLANT
TAPE CLOTH SURG 4X10 WHT LF (GAUZE/BANDAGES/DRESSINGS) ×2 IMPLANT
WATER STERILE IRR 1000ML POUR (IV SOLUTION) ×2 IMPLANT

## 2016-06-08 NOTE — Anesthesia Procedure Notes (Deleted)
Performed by: Neldon Newport

## 2016-06-08 NOTE — H&P (View-Only) (Signed)
   Patient name: Jonathan Horton MRN: [id number] DOB: 05-14-1961 Sex: male    HPI: Jonathan Horton is a 55 y.o. male seen to discuss hemodialysis access. He had a long history of progressive renal insufficiency and his been noncompliant regarding recommendations for follow-up. He presents now in renal failure. Dr. Joelyn Oms requested hemodialysis catheter placement tomorrow and then planning for long-term access. The patient has had no prior renal access.  Past medical history is significant for diabetes and hypertension  Current Facility-Administered Medications  Medication Dose Route Frequency Provider Last Rate Last Dose  . 0.9 %  sodium chloride infusion   Intravenous Once Velvet Bathe, MD      . 0.9 %  sodium chloride infusion  100 mL Intravenous PRN Rexene Agent, MD      . 0.9 %  sodium chloride infusion  100 mL Intravenous PRN Rexene Agent, MD      . alteplase (CATHFLO ACTIVASE) injection 2 mg  2 mg Intracatheter Once PRN Rexene Agent, MD      . Derrill Memo ON 06/08/2016] calcitRIOL (ROCALTROL) capsule 0.5 mcg  0.5 mcg Oral Q T,Th,Sa-HD Rexene Agent, MD      . Derrill Memo ON 06/08/2016] Darbepoetin Alfa (ARANESP) injection 100 mcg  100 mcg Intravenous Q Tue-HD Rexene Agent, MD      . heparin injection 1,000 Units  1,000 Units Dialysis PRN Rexene Agent, MD      . Derrill Memo ON 06/08/2016] insulin aspart (novoLOG) injection 0-9 Units  0-9 Units Subcutaneous TID WC Velvet Bathe, MD         PHYSICAL EXAM: Vitals:   06/07/16 1845 06/07/16 1915 06/07/16 1945 06/07/16 2035  BP: 165/71 152/72 162/87 (!) 175/86  Pulse: 92 93 105 97  Resp: 16 15 19 18   Temp:    98.5 F (36.9 C)  TempSrc:    Oral  SpO2: 100% 99% 100% 100%  Weight:    177 lb 7.5 oz (80.5 kg)  Height:    5\' 10"  (1.778 m)    GENERAL: The patient is a well-nourished male, in no acute distress. The vital signs are documented above Respirations nonlabored. Abdomen soft and nontender. Pulse status: 2+  radial 2+ popliteal and 2+ dorsalis pedis pulses bilaterally  Does have the easily visible cephalic vein in his forearm on the left. Has an IV in his cephalic vein on the right  MEDICAL ISSUES: Acute renal failure on chronic renal failure. Will place hemodialysis catheter tomorrow. Discussed this at length with the patient and his family present. Also discussed the need for long-term access. He is right handed. Will obtain vein mapping but on physical exam does appear to be a good candidate for left cephalic vein to radial artery fistula.   Rosetta Posner, MD FACS [facility] Tel [phone] Pager [phone]

## 2016-06-08 NOTE — Transfer of Care (Signed)
Immediate Anesthesia Transfer of Care Note  Patient: Jonathan Horton  Procedure(s) Performed: Procedure(s): INSERTION OF DIALYSIS CATHETER (Right) ARTERIOVENOUS (AV) FISTULA CREATION (Left)  Patient Location: PACU  Anesthesia Type:MAC  Level of Consciousness: awake, alert  and oriented  Airway & Oxygen Therapy: Patient Spontanous Breathing and Patient connected to nasal cannula oxygen  Post-op Assessment: Report given to RN, Post -op Vital signs reviewed and stable and Patient moving all extremities X 4  Post vital signs: Reviewed and stable  Last Vitals:  Vitals:   06/08/16 0523 06/08/16 0919  BP: (!) 167/78 (!) 161/71  Pulse: 88 93  Resp: 17 16  Temp: 37.1 C 37.1 C    Last Pain:  Vitals:   06/08/16 0919  TempSrc: Oral  PainSc:          Complications: No apparent anesthesia complications

## 2016-06-08 NOTE — Interval H&P Note (Signed)
History and Physical Interval Note:  06/08/2016 10:06 AM  Jonathan Horton  has presented today for surgery, with the diagnosis of renal failure  The various methods of treatment have been discussed with the patient and family. After consideration of risks, benefits and other options for treatment, the patient has consented to  Procedure(s): INSERTION OF DIALYSIS CATHETER (N/A) as a surgical intervention .  The patient's history has been reviewed, patient examined, no change in status, stable for surgery.  I have reviewed the patient's chart and labs.  Questions were answered to the patient's satisfaction.     Deitra Mayo

## 2016-06-08 NOTE — Anesthesia Preprocedure Evaluation (Addendum)
Anesthesia Evaluation  Patient identified by MRN, date of birth, ID band Patient awake    Reviewed: Allergy & Precautions, H&P , NPO status , Patient's Chart, lab work & pertinent test results  Airway Mallampati: II  TM Distance: >3 FB Neck ROM: full    Dental  (+) Teeth Intact, Dental Advidsory Given   Pulmonary    breath sounds clear to auscultation       Cardiovascular hypertension,  Rhythm:Regular Rate:Normal     Neuro/Psych    GI/Hepatic   Endo/Other  diabetes  Renal/GU ESRFRenal disease     Musculoskeletal   Abdominal   Peds  Hematology   Anesthesia Other Findings   Reproductive/Obstetrics                            Anesthesia Physical Anesthesia Plan  ASA: III  Anesthesia Plan: MAC   Post-op Pain Management:    Induction: Intravenous  Airway Management Planned: Natural Airway and Simple Face Mask  Additional Equipment:   Intra-op Plan:   Post-operative Plan:   Informed Consent: I have reviewed the patients History and Physical, chart, labs and discussed the procedure including the risks, benefits and alternatives for the proposed anesthesia with the patient or authorized representative who has indicated his/her understanding and acceptance.   Dental Advisory Given and Dental advisory given  Plan Discussed with: Anesthesiologist, CRNA and Surgeon  Anesthesia Plan Comments:        Anesthesia Quick Evaluation

## 2016-06-08 NOTE — Op Note (Signed)
Jonathan Horton   MRN: [id number] DOB: 02/04/1961    DATE OF OPERATION: 06/08/2016  PREOP DIAGNOSIS: Stage V chronic kidney disease  POSTOP DIAGNOSIS: Same  PROCEDURE:  1. Ultrasound-guided placement of right IJ tunneled dialysis catheter 2. Left upper arm radial cephalic AV fistula  SURGEON: Judeth Cornfield. Scot Dock, MD, FACS  ASSIST: None  ANESTHESIA: Local with sedation  EBL: Minimal  INDICATIONS: Jonathan Horton is a 55 y.o. male the patient persists for new access.  FINDINGS: Patent right IJ. 3.5 mm upper arm cephalic vein left arm. High bifurcation of the brachial artery. The anastomosis was to the radial artery just above the antecubital level.  TECHNIQUE: The patient was taken to the operating room and sedated by anesthesia. The neck and upper chest were prepped and draped in usual sterile fashion. The skin was anesthetized with lidocaine. Under ultrasound guidance I cannulated the right IJ. The vein appeared to be under very high pressure and therefore elected to remove the needle and hold pressure to be sure that I was not in the artery. I held pressure for 5 minutes and it was no evidence of bleeding. I then cannulated the right IJ with a micropuncture needle and introduced a micropuncture wire. I confirmed that this was in the vein and then placed a micropuncture sheath. I then placed the J-wire. The tract over the wire was dilated and then the dilator and peel-away sheath advanced over the wire and the wire and dilator removed. The 23 cm catheter was passed through the peel-away sheath into the right atrium. The peel-away sheath was removed. The exit site for the cath was selected and the skin anesthetized between the 2 areas the catheter was then brought through the tunnel cut the appropriate length and the distal ports were attached. Both ports withdrew easily within flushed with heparin saline and filled with concentrated heparin. Catheter was secured at its exit site with 3-0  nylon suture. The IJ cannulation site was closed with a 4-0 subcutaneous stitch. Sterile dressing was applied.  Attention was turned to the left arm. The left upper extremity was prepped and draped in usual sterile fashion. The right radial pulse was weak. The vein was marginal in size and I therefore elected to place an upper arm fistula. I looked at the upper arm cephalic vein at the time of surgery and appeared to be reasonable. The left upper extremity was prepped and draped in usual sterile fashion. A transverse incision was made just above the antecubital level. Here the cephalic vein was dissected free and ligated distally. It irrigated up with heparinized saline was about 3.5 mm vein. The artery was dissected free beneath the fascia. It was somewhat small. I therefore interrogated with the Doppler and it was clear that the patient had a high bifurcation of the brachial artery. The radial artery was more superficial and I therefore used this for inflow. The patient was heparinized. The radial artery was clamped proximally and distally and longitudinal arteriotomy was made. The vein was sewn inside the artery using continuous 6-0 Prolene suture. At the completion was a good thrill in the fistula. There was a good radial and ulnar signals with Doppler. This wound was then closed deeply with 3-0 Vicryl and the skin closed with 4-0 Vicryl after hemostasis was obtained. Dermabond was applied. Patient tolerated the procedure well and was transferred to the recovery room in stable condition. All needle and sponge counts were correct.  Deitra Mayo, MD, FACS [facility] [place]  DATE OF DICTATION:   06/08/2016

## 2016-06-08 NOTE — Progress Notes (Signed)
PROGRESS NOTE    Jonathan Horton  VHQ:[id number] DOB: [birth year] DOA: 06/07/2016 PCP: Philis Fendt, MD   Brief Narrative:  Patient is a 55 year old who presents with renal failure. Suspected acute on chronic. Nephrology on board   Assessment & Plan:   Active Problems:   Acute renal failure (ARF) (HCC) - Renal ultrasound reported no hydronephrosis - Nephrology on board and managing - Vascular surgeon consulted to discuss catheter placement    Essential hypertension - Continue current blood pressure medication regimen. - We'll add when necessary hydralazine    Diabetes mellitus ([place]) - Continue sliding scale insulin   DVT prophylaxis: Heparin Code Status: Full Family Communication: None at bedside Disposition Plan: Pending improvement in condition   Consultants:   Vascular surgery  Nephrology   Procedures: Catheter placement   Antimicrobials: None   Subjective: Pt c/o discomfort and requesting increase in pain medication. No acute issues overnight.  Objective: Vitals:   06/08/16 1530 06/08/16 1600 06/08/16 1628 06/08/16 1701  BP: (!) 164/83 (!) 179/90 (!) 164/95 (!) 185/84  Pulse: 94 87 87 95  Resp: 14 16 16 14   Temp:   98.4 F (36.9 C) 98.3 F (36.8 C)  TempSrc:   Oral Oral  SpO2:   100% 100%  Weight:   77.8 kg (171 lb 8.3 oz)   Height:        Intake/Output Summary (Last 24 hours) at 06/08/16 1755 Last data filed at 06/08/16 1706  Gross per 24 hour  Intake             1390 ml  Output              913 ml  Net              477 ml   Filed Weights   06/07/16 2035 06/08/16 1420 06/08/16 1628  Weight: 80.5 kg (177 lb 7.5 oz) 77.8 kg (171 lb 8.3 oz) 77.8 kg (171 lb 8.3 oz)    Examination:  General exam: Appears calm and uncomfortable , in nad. Respiratory system: Clear to auscultation. Respiratory effort normal. Cardiovascular system: S1 & S2 heard, RRR. No JVD, murmurs, rubs, gallops or clicks. No pedal edema. Gastrointestinal system: Abdomen  is nondistended, soft and nontender. No organomegaly or masses felt. Normal bowel sounds heard. Central nervous system: Alert and oriented. No focal neurological deficits. Extremities: Symmetric 5 x 5 power. Skin: No rashes, lesions or ulcers Psychiatry: Judgement and insight appear normal. Mood & affect appropriate.     Data Reviewed: I have personally reviewed following labs and imaging studies  CBC:  Recent Labs Lab 06/07/16 1317 06/07/16 2043 06/08/16 0330  WBC 5.9 6.1 5.8  HGB 6.7* 6.8* 7.5*  HCT 20.2* 20.3* 21.9*  MCV 94.8 94.0 89.4  PLT 288 255 413   Basic Metabolic Panel:  Recent Labs Lab 06/07/16 1317 06/07/16 1722 06/07/16 2042 06/08/16 0330  NA 135 133* 131* 133*  K 4.5 5.0 4.6 4.9  CL 105 103 102 102  CO2 14* 13* 12* 14*  GLUCOSE 125* 164* 189* 275*  BUN 160* 161* 159* 161*  CREATININE 31.00* 31.70* 32.26* 31.95*  CALCIUM 7.0* 6.6* 6.9* 7.0*  PHOS  --   --  6.7*  --    GFR: Estimated Creatinine Clearance: 2.7 mL/min (by C-G formula based on SCr of 31.95 mg/dL (H)). Liver Function Tests:  Recent Labs Lab 06/07/16 1317 06/07/16 2042  AST 11*  --   ALT 14*  --   ALKPHOS 110  --  BILITOT 0.5  --   PROT 7.5  --   ALBUMIN 3.4* 3.3*    Recent Labs Lab 06/07/16 1317  LIPASE 316*   No results for input(s): AMMONIA in the last 168 hours. Coagulation Profile: No results for input(s): INR, PROTIME in the last 168 hours. Cardiac Enzymes:  Recent Labs Lab 06/07/16 1722  CKTOTAL 542*   BNP (last 3 results) No results for input(s): PROBNP in the last 8760 hours. HbA1C: No results for input(s): HGBA1C in the last 72 hours. CBG:  Recent Labs Lab 06/07/16 2103 06/08/16 0759 06/08/16 1706  GLUCAP 177* 97 106*   Lipid Profile: No results for input(s): CHOL, HDL, LDLCALC, TRIG, CHOLHDL, LDLDIRECT in the last 72 hours. Thyroid Function Tests: No results for input(s): TSH, T4TOTAL, FREET4, T3FREE, THYROIDAB in the last 72 hours. Anemia  Panel: No results for input(s): VITAMINB12, FOLATE, FERRITIN, TIBC, IRON, RETICCTPCT in the last 72 hours. Sepsis Labs: No results for input(s): PROCALCITON, LATICACIDVEN in the last 168 hours.  Recent Results (from the past 240 hour(s))  Surgical pcr screen     Status: Abnormal   Collection Time: 06/08/16  2:33 AM  Result Value Ref Range Status   MRSA, PCR NEGATIVE NEGATIVE Final   Staphylococcus aureus POSITIVE (A) NEGATIVE Final    Comment:        The Xpert SA Assay (FDA approved for NASAL specimens in patients over 24 years of age), is one component of a comprehensive surveillance program.  Test performance has been validated by [facility] for patients greater than or equal to 51 year old. It is not intended to diagnose infection nor to guide or monitor treatment.          Radiology Studies: US Renal  Result Date: 06/08/2016 CLINICAL DATA:  Acute renal failure. Elevated BUN and creatinine in the ED. History of hypertension, diabetes, chronic kidney disease. EXAM: RENAL / URINARY TRACT ULTRASOUND COMPLETE COMPARISON:  None. FINDINGS: Right Kidney: Length: 7.5 cm. Cystic lesion demonstrated in the mid pole measuring about 2.9 cm diameter. Mild complexity with central septation and slightly irregular wall. No solid mass or hydronephrosis. Left Kidney: Length: 9 cm. Cystic lesion demonstrated in the mid pole measuring 2.3 cm maximal diameter. No solid mass or hydronephrosis. Bladder: Bladder wall appears somewhat irregular diffusely. Filling defect is demonstrated in the base of the bladder which probably represents stone or debris but can't exclude polypoid lesion. Unable to reposition the patient to see whether this lesion loops. Prostatic impression upon the bladder base. IMPRESSION: Bilateral renal parenchymal atrophy consistent with chronic disease. Bilateral benign-appearing cysts. No hydronephrosis. Irregular thickening of the bladder wall with intraluminal filling defect.  Can't exclude a polypoid lesion. Consider repeat imaging when patient is better able to move or cystoscopy for further evaluation. Electronically Signed   By: Lucienne Capers M.D.   On: 06/08/2016 03:42   Dg Chest Port 1 View  Result Date: 06/08/2016 CLINICAL DATA:  Status post dialysis catheter insertion EXAM: PORTABLE CHEST 1 VIEW COMPARISON:  03/11/2009 FINDINGS: Interval placement of right dialysis catheter. The tip is at the cavoatrial junction. No pneumothorax. Mild cardiomegaly. Lungs are clear. No effusions or edema. No acute bony abnormality. IMPRESSION: Right dialysis catheter placement with the tip at the cavoatrial junction. No pneumothorax. Electronically Signed   By: Rolm Baptise M.D.   On: 06/08/2016 13:33   Dg Fluoro Guide Cv Line-no Report  Result Date: 06/08/2016 CLINICAL DATA:  FLOURO GUIDE CV LINE Fluoroscopy was utilized by the requesting  physician.  No radiographic interpretation.        Scheduled Meds: . amLODipine  10 mg Oral Daily  . calcitRIOL  0.5 mcg Oral Q T,Th,Sa-HD  . calcium acetate  667 mg Oral TID WC  . carvedilol  25 mg Oral BID WC  . Chlorhexidine Gluconate Cloth  6 each Topical Daily  . cloNIDine  0.1 mg Oral BID  . darbepoetin (ARANESP) injection - DIALYSIS  100 mcg Intravenous Q Tue-HD  . heparin  5,000 Units Subcutaneous Q8H  . [START ON 06/09/2016] Influenza vac split quadrivalent PF  0.5 mL Intramuscular Tomorrow-1000  . insulin aspart  0-9 Units Subcutaneous TID WC  . multivitamin  1 tablet Oral QHS  . mupirocin ointment  1 application Nasal BID  . pantoprazole  40 mg Oral Daily  . [START ON 06/09/2016] pneumococcal 23 valent vaccine  0.5 mL Intramuscular Tomorrow-1000  . sodium chloride flush  3 mL Intravenous Q12H  . sodium chloride flush  3 mL Intravenous Q12H   Continuous Infusions: . sodium chloride 10 mL/hr at 06/08/16 1011     LOS: 1 day    Time spent: > 35 minutes    Velvet Bathe, MD [facility] Pager  [phone]  If 7PM-7AM, please contact night-coverage [email or web address] Password [email or web address] 06/08/2016, 5:55 PM

## 2016-06-08 NOTE — Anesthesia Postprocedure Evaluation (Signed)
Anesthesia Post Note  Patient: Jonathan Horton  Procedure(s) Performed: Procedure(s) (LRB): INSERTION OF DIALYSIS CATHETER (Right) ARTERIOVENOUS (AV) FISTULA CREATION (Left)  Patient location during evaluation: PACU Anesthesia Type: MAC Level of consciousness: awake and alert, awake and oriented Pain management: pain level controlled Vital Signs Assessment: post-procedure vital signs reviewed and stable Respiratory status: spontaneous breathing, nonlabored ventilation and respiratory function stable Cardiovascular status: blood pressure returned to baseline Anesthetic complications: no    Last Vitals:  Vitals:   06/08/16 1628 06/08/16 1701  BP: (!) 164/95 (!) 185/84  Pulse: 87 95  Resp: 16 14  Temp: 36.9 C 36.8 C    Last Pain:  Vitals:   06/08/16 1701  TempSrc: Oral  PainSc:                  Anh Mangano COKER

## 2016-06-08 NOTE — Anesthesia Procedure Notes (Signed)
Procedure Name: MAC Date/Time: 06/08/2016 10:45 AM Performed by: Neldon Newport Pre-anesthesia Checklist: Timeout performed, Patient being monitored, Suction available, Emergency Drugs available and Patient identified Patient Re-evaluated:Patient Re-evaluated prior to inductionOxygen Delivery Method: Simple face mask Placement Confirmation: positive ETCO2 Dental Injury: Teeth and Oropharynx as per pre-operative assessment

## 2016-06-08 NOTE — Progress Notes (Addendum)
[facility] Rounding Note  Subjective/Interval History:   TDC/AVF placed today Getting 1st HD now Neck sore Appetite "gone"  Objective Vital signs in last 24 hours: Vitals:   06/08/16 1420 06/08/16 1424 06/08/16 1430 06/08/16 1500  BP: (!) 167/89 (!) 168/88 (!) 174/91 (!) 156/88  Pulse: 83 84 89 84  Resp: 13 15 17 13   Temp: 97.8 F (36.6 C)     TempSrc: Oral     SpO2: 100%     Weight: 77.8 kg (171 lb 8.3 oz)     Height:        Intake/Output Summary (Last 24 hours) at 06/08/16 1606 Last data filed at 06/08/16 1218  Gross per 24 hour  Intake             1390 ml  Output             1313 ml  Net               77 ml   Physical Exam:  Blood pressure (!) 156/88, pulse 84, temperature 97.8 F (36.6 C), temperature source Oral, resp. rate 13, height 5\' 10"  (1.778 m), weight 77.8 kg (171 lb 8.3 oz), SpO2 100 %.  Seen on HD TDC at 200 R IJ Uremic fetor, ashy skin Regular rhythm [id number] No S3 Systolic murmur USB no rub Wide split S2 Lungs clear Soft abdomen No edema whatsoever Mild asterixus L AVF (9/19)  Labs: Basic Metabolic Panel:  Recent Labs Lab 06/07/16 1317 06/07/16 1722 06/07/16 2042 06/08/16 0330  NA 135 133* 131* 133*  K 4.5 5.0 4.6 4.9  CL 105 103 102 102  CO2 14* 13* 12* 14*  GLUCOSE 125* 164* 189* 275*  BUN 160* 161* 159* 161*  CREATININE 31.00* 31.70* 32.26* 31.95*  CALCIUM 7.0* 6.6* 6.9* 7.0*  PHOS  --   --  6.7*  --    Liver Function Tests:  Recent Labs Lab 06/07/16 1317 06/07/16 2042  AST 11*  --   ALT 14*  --   ALKPHOS 110  --   BILITOT 0.5  --   PROT 7.5  --   ALBUMIN 3.4* 3.3*    Recent Labs Lab 06/07/16 1317  LIPASE 316*     Recent Labs Lab 06/07/16 1317 06/07/16 2043 06/08/16 0330  WBC 5.9 6.1 5.8  HGB 6.7* 6.8* 7.5*  HCT 20.2* 20.3* 21.9*  MCV 94.8 94.0 89.4  PLT 288 255 234    Recent Labs Lab 06/07/16 1722  CKTOTAL 542*     Recent Labs Lab 06/07/16 2103 06/08/16 0759  GLUCAP 177* 97   Studies/Results: US  Renal  Result Date: 06/08/2016 CLINICAL DATA:  Acute renal failure. Elevated BUN and creatinine in the ED. History of hypertension, diabetes, chronic kidney disease. EXAM: RENAL / URINARY TRACT ULTRASOUND COMPLETE COMPARISON:  None. FINDINGS: Right Kidney: Length: 7.5 cm. Cystic lesion demonstrated in the mid pole measuring about 2.9 cm diameter. Mild complexity with central septation and slightly irregular wall. No solid mass or hydronephrosis. Left Kidney: Length: 9 cm. Cystic lesion demonstrated in the mid pole measuring 2.3 cm maximal diameter. No solid mass or hydronephrosis. Bladder: Bladder wall appears somewhat irregular diffusely. Filling defect is demonstrated in the base of the bladder which probably represents stone or debris but can't exclude polypoid lesion. Unable to reposition the patient to see whether this lesion loops. Prostatic impression upon the bladder base. IMPRESSION: Bilateral renal parenchymal atrophy consistent with chronic disease. Bilateral benign-appearing cysts. No hydronephrosis. Irregular thickening of  the bladder wall with intraluminal filling defect. Can't exclude a polypoid lesion. Consider repeat imaging when patient is better able to move or cystoscopy for further evaluation. Electronically Signed   By: Lucienne Capers M.D.   On: 06/08/2016 03:42   Dg Chest Port 1 View  Result Date: 06/08/2016 CLINICAL DATA:  Status post dialysis catheter insertion EXAM: PORTABLE CHEST 1 VIEW COMPARISON:  03/11/2009 FINDINGS: Interval placement of right dialysis catheter. The tip is at the cavoatrial junction. No pneumothorax. Mild cardiomegaly. Lungs are clear. No effusions or edema. No acute bony abnormality. IMPRESSION: Right dialysis catheter placement with the tip at the cavoatrial junction. No pneumothorax. Electronically Signed   By: Rolm Baptise M.D.   On: 06/08/2016 13:33   Dg Fluoro Guide Cv Line-no Report  Result Date: 06/08/2016 CLINICAL DATA:  FLOURO GUIDE CV LINE  Fluoroscopy was utilized by the requesting physician.  No radiographic interpretation.   Medications: . sodium chloride 10 mL/hr at 06/08/16 1011   . amLODipine  10 mg Oral Daily  . calcitRIOL  0.5 mcg Oral Q T,Th,Sa-HD  . carvedilol  25 mg Oral BID WC  . Chlorhexidine Gluconate Cloth  6 each Topical Daily  . cloNIDine  0.1 mg Oral BID  . darbepoetin (ARANESP) injection - DIALYSIS  100 mcg Intravenous Q Tue-HD  . heparin  5,000 Units Subcutaneous Q8H  . [START ON 06/09/2016] Influenza vac split quadrivalent PF  0.5 mL Intramuscular Tomorrow-1000  . insulin aspart  0-9 Units Subcutaneous TID WC  . mupirocin ointment  1 application Nasal BID  . pantoprazole  40 mg Oral Daily  . [START ON 06/09/2016] pneumococcal 23 valent vaccine  0.5 mL Intramuscular Tomorrow-1000  . sodium chloride flush  3 mL Intravenous Q12H  . 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    1. New ESRD with uremia 2. Anemia, severe 2/2 renal failure with ? Fe def (Fe studies pending) 3. Hyperkalemia, mild 4. Metabolic Acidosis, mild 5. HTN 6. DM2  Plan 1. S/p catheter placement by VVS 2. VMap ordered, eval for AVF pending 3. Getting HD #1 today via TDC: 2h, 2K, 3.5Ca, Qb 200, No UF, No heparin 4. HD #2 tomorrow 2.5 hours, 2K, 3.5 Ca, 250/500 5. CLIP process started 6. F/U Fe studies and PTH (pending) 7. Start VDRA for hypocalcemia 8. Transfused 1 unit yesterday. (Hb 7.5 post transfusion) 9. ESA started (Aranesp 100 today with HD)   Jamal Maes, MD [place] Pager 06/08/2016, 4:14 PM

## 2016-06-09 ENCOUNTER — Encounter (HOSPITAL_COMMUNITY): Payer: Self-pay | Admitting: Vascular Surgery

## 2016-06-09 ENCOUNTER — Telehealth: Payer: Self-pay | Admitting: Vascular Surgery

## 2016-06-09 ENCOUNTER — Encounter (HOSPITAL_COMMUNITY): Payer: BLUE CROSS/BLUE SHIELD

## 2016-06-09 DIAGNOSIS — N186 End stage renal disease: Secondary | ICD-10-CM

## 2016-06-09 DIAGNOSIS — Z992 Dependence on renal dialysis: Secondary | ICD-10-CM

## 2016-06-09 DIAGNOSIS — I10 Essential (primary) hypertension: Secondary | ICD-10-CM

## 2016-06-09 LAB — RENAL FUNCTION PANEL
ALBUMIN: 2.6 g/dL — AB (ref 3.5–5.0)
Anion gap: 13 (ref 5–15)
BUN: 112 mg/dL — AB (ref 6–20)
CALCIUM: 7.7 mg/dL — AB (ref 8.9–10.3)
CHLORIDE: 102 mmol/L (ref 101–111)
CO2: 19 mmol/L — ABNORMAL LOW (ref 22–32)
CREATININE: 23.19 mg/dL — AB (ref 0.61–1.24)
GFR, EST AFRICAN AMERICAN: 2 mL/min — AB (ref >60)
GFR, EST NON AFRICAN AMERICAN: 2 mL/min — AB (ref >60)
Glucose, Bld: 127 mg/dL — ABNORMAL HIGH (ref 65–99)
PHOSPHORUS: 5.8 mg/dL — AB (ref 2.5–4.6)
Potassium: 4.1 mmol/L (ref 3.5–5.1)
SODIUM: 134 mmol/L — AB (ref 135–145)

## 2016-06-09 LAB — CBC
HCT: 20.6 % — ABNORMAL LOW (ref 39.0–52.0)
Hemoglobin: 7 g/dL — ABNORMAL LOW (ref 13.0–17.0)
MCH: 30.2 pg (ref 26.0–34.0)
MCHC: 34 g/dL (ref 30.0–36.0)
MCV: 88.8 fL (ref 78.0–100.0)
PLATELETS: 229 10*3/uL (ref 150–400)
RBC: 2.32 MIL/uL — AB (ref 4.22–5.81)
RDW: 18 % — AB (ref 11.5–15.5)
WBC: 5.2 10*3/uL (ref 4.0–10.5)

## 2016-06-09 LAB — HEPATITIS B SURFACE ANTIBODY,QUALITATIVE: HEP B S AB: REACTIVE

## 2016-06-09 LAB — HEPATITIS B CORE ANTIBODY, TOTAL: HEP B C TOTAL AB: POSITIVE — AB

## 2016-06-09 LAB — GLUCOSE, CAPILLARY
GLUCOSE-CAPILLARY: 129 mg/dL — AB (ref 65–99)
GLUCOSE-CAPILLARY: 164 mg/dL — AB (ref 65–99)
Glucose-Capillary: 308 mg/dL — ABNORMAL HIGH (ref 65–99)

## 2016-06-09 NOTE — Progress Notes (Signed)
   VASCULAR SURGERY ASSESSMENT & PLAN:  1 Day Post-Op s/p: TDC and left BC AVF  Doing well. I will arrange f/u visit in 6 weeks to check on his fistula.  Vascular surgery will be available as needed.   SUBJECTIVE: Getting ready to start HD. Left arm a little sore.  PHYSICAL EXAM: Vitals:   06/08/16 1701 06/08/16 1842 06/08/16 2020 06/09/16 0608  BP: (!) 185/84 (!) 160/71 (!) 170/76 (!) 150/74  Pulse: 95  100 92  Resp: 14  15 16   Temp: 98.3 F (36.8 C)  99.1 F (37.3 C) 99.2 F (37.3 C)  TempSrc: Oral  Oral Oral  SpO2: 100%  100% 99%  Weight:   172 lb 6.4 oz (78.2 kg)   Height:       Catheter site looks fine. Incision left arm looks fine. Good thrill in L BC AVF. Palpable left radial pulse.  LABS: Lab Results  Component Value Date   WBC 5.8 06/08/2016   HGB 7.5 (L) 06/08/2016   HCT 21.9 (L) 06/08/2016   MCV 89.4 06/08/2016   PLT 234 06/08/2016   Lab Results  Component Value Date   CREATININE 31.95 (H) 06/08/2016   No results found for: INR, PROTIME CBG (last 3)   Recent Labs  06/08/16 0759 06/08/16 1706 06/08/16 2018  GLUCAP 97 106* 183*    Active Problems:   Renal failure   Acute renal failure (ARF) ([place])   Essential hypertension   Diabetes mellitus ([place])    Gae Gallop Beeper: [phone] 06/09/2016

## 2016-06-09 NOTE — Progress Notes (Signed)
[facility] Rounding Note  Subjective/Interval History:   TDC/AVF placed 9/19 (L upper arm RC AVF - anast to radial artery above the antecub level) For HD #2 today Tolerated HD yesterday - no cramping or HA Still no appetite but reassured that will return once well dialyzed   Objective Vital signs in last 24 hours: Vitals:   06/08/16 1701 06/08/16 1842 06/08/16 2020 06/09/16 0608  BP: (!) 185/84 (!) 160/71 (!) 170/76 (!) 150/74  Pulse: 95  100 92  Resp: 14  15 16   Temp: 98.3 F (36.8 C)  99.1 F (37.3 C) 99.2 F (37.3 C)  TempSrc: Oral  Oral Oral  SpO2: 100%  100% 99%  Weight:   78.2 kg (172 lb 6.4 oz)   Height:        Intake/Output Summary (Last 24 hours) at 06/09/16 0733 Last data filed at 06/09/16 0200  Gross per 24 hour  Intake          1078.17 ml  Output              513 ml  Net           565.17 ml   Physical Exam:  Blood pressure (!) 150/74, pulse 92, temperature 99.2 F (37.3 C), temperature source Oral, resp. rate 16, height 5\' 10"  (1.778 m), weight 78.2 kg (172 lb 6.4 oz), SpO2 99 %.  Seen on HD TDC R IJ Ashy skin Regular rhythm [id number] No S3 Systolic murmur USB no rub Wide split S2 Lungs clear Soft abdomen No edema  L upper arm RC AVF (9/19) + bruit/thrill  Labs: Basic Metabolic Panel:  Recent Labs Lab 06/07/16 1317 06/07/16 1722 06/07/16 2042 06/08/16 0330  NA 135 133* 131* 133*  K 4.5 5.0 4.6 4.9  CL 105 103 102 102  CO2 14* 13* 12* 14*  GLUCOSE 125* 164* 189* 275*  BUN 160* 161* 159* 161*  CREATININE 31.00* 31.70* 32.26* 31.95*  CALCIUM 7.0* 6.6* 6.9* 7.0*  PHOS  --   --  6.7*  --      Recent Labs Lab 06/07/16 1317 06/07/16 2042  AST 11*  --   ALT 14*  --   ALKPHOS 110  --   BILITOT 0.5  --   PROT 7.5  --   ALBUMIN 3.4* 3.3*    Recent Labs Lab 06/07/16 1317  LIPASE 316*     Recent Labs Lab 06/07/16 1317 06/07/16 2043 06/08/16 0330  WBC 5.9 6.1 5.8  HGB 6.7* 6.8* 7.5*  HCT 20.2* 20.3* 21.9*  MCV 94.8 94.0 89.4  PLT 288  255 234    Recent Labs Lab 06/07/16 1722  CKTOTAL 542*     Recent Labs Lab 06/07/16 2103 06/08/16 0759 06/08/16 1706 06/08/16 2018  GLUCAP 177* 97 106* 183*    Iron/TIBC/Ferritin/ %Sat    Component Value Date/Time   IRON 72 06/08/2016 1707   TIBC 224 (L) 06/08/2016 1707   FERRITIN 206 06/08/2016 1707   IRONPCTSAT 32 06/08/2016 1707   Studies/Results: US Renal  Result Date: 06/08/2016 CLINICAL DATA:  Acute renal failure. Elevated BUN and creatinine in the ED. History of hypertension, diabetes, chronic kidney disease. EXAM: RENAL / URINARY TRACT ULTRASOUND COMPLETE COMPARISON:  None. FINDINGS: Right Kidney: Length: 7.5 cm. Cystic lesion demonstrated in the mid pole measuring about 2.9 cm diameter. Mild complexity with central septation and slightly irregular wall. No solid mass or hydronephrosis. Left Kidney: Length: 9 cm. Cystic lesion demonstrated in the mid pole measuring 2.3 cm  maximal diameter. No solid mass or hydronephrosis. Bladder: Bladder wall appears somewhat irregular diffusely. Filling defect is demonstrated in the base of the bladder which probably represents stone or debris but can't exclude polypoid lesion. Unable to reposition the patient to see whether this lesion loops. Prostatic impression upon the bladder base. IMPRESSION: Bilateral renal parenchymal atrophy consistent with chronic disease. Bilateral benign-appearing cysts. No hydronephrosis. Irregular thickening of the bladder wall with intraluminal filling defect. Can't exclude a polypoid lesion. Consider repeat imaging when patient is better able to move or cystoscopy for further evaluation. Electronically Signed   By: Lucienne Capers M.D.   On: 06/08/2016 03:42   Dg Chest Port 1 View  Result Date: 06/08/2016 CLINICAL DATA:  Status post dialysis catheter insertion EXAM: PORTABLE CHEST 1 VIEW COMPARISON:  03/11/2009 FINDINGS: Interval placement of right dialysis catheter. The tip is at the cavoatrial junction.  No pneumothorax. Mild cardiomegaly. Lungs are clear. No effusions or edema. No acute bony abnormality. IMPRESSION: Right dialysis catheter placement with the tip at the cavoatrial junction. No pneumothorax. Electronically Signed   By: Rolm Baptise M.D.   On: 06/08/2016 13:33   Dg Fluoro Guide Cv Line-no Report  Result Date: 06/08/2016 CLINICAL DATA:  FLOURO GUIDE CV LINE Fluoroscopy was utilized by the requesting physician.  No radiographic interpretation.   Medications: . sodium chloride 10 mL/hr at 06/08/16 1011   . amLODipine  10 mg Oral Daily  . calcitRIOL  0.5 mcg Oral Q T,Th,Sa-HD  . calcium acetate  667 mg Oral TID WC  . carvedilol  25 mg Oral BID WC  . Chlorhexidine Gluconate Cloth  6 each Topical Daily  . cloNIDine  0.1 mg Oral BID  . darbepoetin (ARANESP) injection - DIALYSIS  100 mcg Intravenous Q Tue-HD  . heparin  5,000 Units Subcutaneous Q8H  . Influenza vac split quadrivalent PF  0.5 mL Intramuscular Tomorrow-1000  . insulin aspart  0-9 Units Subcutaneous TID WC  . multivitamin  1 tablet Oral QHS  . mupirocin ointment  1 application Nasal BID  . pantoprazole  40 mg Oral Daily  . pneumococcal 23 valent vaccine  0.5 mL Intramuscular Tomorrow-1000  . sodium chloride flush  3 mL Intravenous Q12H  . 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    1. New ESRD  2. Anemia, severe 2/2 renal failure (with adequate Fe stores) 3. Hyperkalemia, mild 4. Metabolic Acidosis, mild 5. HTN 6. DM2  Plan 1. S/p catheter placement by VVS, as well as L AVF (9/19) 2. HD #2 today 2.5 hours, 2K, 3.5 Ca, 250/500; #3 tomorrow 3. CLIP process pending 4. F/U PTH (pending) and on calcitriol currently (started on adm) 5. Transfused 1 unit 9/18. (Hb 7.5 post transfusion) 6. ESA started (Aranesp 100 9/19  with HD)   Jamal Maes, MD [facility] [phone] Pager 06/09/2016, 7:33 AM

## 2016-06-09 NOTE — Telephone Encounter (Signed)
LVM on home # about appt on 11/1 with Dr Scot Dock

## 2016-06-09 NOTE — Progress Notes (Signed)
PROGRESS NOTE                                                                                                                                                                                                             Patient Demographics:    Jonathan Horton, is a 55 y.o. male, DOB - Jan 18, 1961, SFK:[id number]  Admit date - 06/07/2016   Admitting Physician Velvet Bathe, MD  Outpatient Primary MD for the patient is Philis Fendt, MD  LOS - 2  Outpatient Specialists: None  Chief Complaint  Patient presents with  . Emesis  . Arm Pain  . Back Pain       Brief Narrative  55 year old male history of hypertension and diabetes mellitus presented with a one-week history of generalized weakness, nausea and poor appetite. In the ED he was found to be anemic with hemoglobin of 6.7 and serum creatinine of 31.7 with BUN of 161. Patient admitted to hospital service and started on hemodialysis.   Subjective:   Seen after returning from dialysis. Complains of pain around dialysis catheter site and AV fistula which is slowly getting better. Reports being tired.     Hospital course New end-stage renal disease  had HD catheter placement and left AV fistula placement on 9/19. Had HD ( day2) today. CLIP in process. Continue calcitriol. PT is pending.  Anemia of kidney disease Received 1 unit PRBC on 9/18. Hemoglobin improved to 7.5. Started on Aranesp with dialysis.  Diabetes mellitus ([place]) Monitor on sliding scale coverage. Check A1c.   Code Status: full code  Family Communication  Wife at bedside Disposition Plan  : Home once CLIPed  Barriers For Discharge :  pending  CLIP  Consults nephrology Vascular surgery  Procedures  :  HD catheter placement Left AV fistula  DVT Prophylaxis  :  Heparin  Lab Results  Component Value Date   PLT 229 06/09/2016    Antibiotics  :    Anti-infectives    Start      Dose/Rate Route Frequency Ordered Stop   06/08/16 1100  cefUROXime (ZINACEF) 1.5 g in dextrose 5 % 50 mL IVPB     1.5 g 100 mL/hr over 30 Minutes Intravenous  Once 06/08/16 1050 06/08/16 1057        Objective:   Vitals:   06/09/16 0930 06/09/16 1000 06/09/16  1015 06/09/16 1136  BP: (!) 175/85 (!) 179/86 (!) 183/82 (!) 172/69  Pulse: 90 94 99 (!) 108  Resp:   19 19  Temp:   98.4 F (36.9 C) 99.1 F (37.3 C)  TempSrc:   Oral Oral  SpO2:   99% 98%  Weight:   77.2 kg (170 lb 3.1 oz)   Height:        Wt Readings from Last 3 Encounters:  06/09/16 77.2 kg (170 lb 3.1 oz)  11/08/13 83.5 kg (184 lb)     Intake/Output Summary (Last 24 hours) at 06/09/16 1524 Last data filed at 06/09/16 1402  Gross per 24 hour  Intake           758.17 ml  Output                0 ml  Net           758.17 ml     Physical Exam  Gen: not in distress HEENT:Moist mucosa, supple neck Chest: clear b/l, no added sounds CVS: N S1&S2, no murmurs, Rt HD catheter  GI: soft, NT, ND, BS+ Musculoskeletal: warm, no edema, left UE AVF CNS: Alert and oriented    Data Review:    CBC  Recent Labs Lab 06/07/16 1317 06/07/16 2043 06/08/16 0330 06/09/16 0746  WBC 5.9 6.1 5.8 5.2  HGB 6.7* 6.8* 7.5* 7.0*  HCT 20.2* 20.3* 21.9* 20.6*  PLT 288 255 234 229  MCV 94.8 94.0 89.4 88.8  MCH 31.5 31.5 30.6 30.2  MCHC 33.2 33.5 34.2 34.0  RDW 12.6 12.6 16.6* 18.0*    Chemistries   Recent Labs Lab 06/07/16 1317 06/07/16 1722 06/07/16 2042 06/08/16 0330 06/09/16 0746  NA 135 133* 131* 133* 134*  K 4.5 5.0 4.6 4.9 4.1  CL 105 103 102 102 102  CO2 14* 13* 12* 14* 19*  GLUCOSE 125* 164* 189* 275* 127*  BUN 160* 161* 159* 161* 112*  CREATININE 31.00* 31.70* 32.26* 31.95* 23.19*  CALCIUM 7.0* 6.6* 6.9* 7.0* 7.7*  AST 11*  --   --   --   --   ALT 14*  --   --   --   --   ALKPHOS 110  --   --   --   --   BILITOT 0.5  --   --   --   --     ------------------------------------------------------------------------------------------------------------------ No results for input(s): CHOL, HDL, LDLCALC, TRIG, CHOLHDL, LDLDIRECT in the last 72 hours.  No results found for: HGBA1C ------------------------------------------------------------------------------------------------------------------ No results for input(s): TSH, T4TOTAL, T3FREE, THYROIDAB in the last 72 hours.  Invalid input(s): FREET3 ------------------------------------------------------------------------------------------------------------------  Recent Labs  06/08/16 1707  FERRITIN 206  TIBC 224*  IRON 72    Coagulation profile No results for input(s): INR, PROTIME in the last 168 hours.  No results for input(s): DDIMER in the last 72 hours.  Cardiac Enzymes No results for input(s): CKMB, TROPONINI, MYOGLOBIN in the last 168 hours.  Invalid input(s): CK ------------------------------------------------------------------------------------------------------------------ No results found for: BNP  Inpatient Medications  Scheduled Meds: . amLODipine  10 mg Oral Daily  . calcitRIOL  0.5 mcg Oral Q T,Th,Sa-HD  . calcium acetate  667 mg Oral TID WC  . carvedilol  25 mg Oral BID WC  . Chlorhexidine Gluconate Cloth  6 each Topical Daily  . cloNIDine  0.1 mg Oral BID  . darbepoetin (ARANESP) injection - DIALYSIS  100 mcg Intravenous Q Tue-HD  . heparin  5,000 Units Subcutaneous  Q8H  . insulin aspart  0-9 Units Subcutaneous TID WC  . multivitamin  1 tablet Oral QHS  . mupirocin ointment  1 application Nasal BID  . pantoprazole  40 mg Oral Daily  . sodium chloride flush  3 mL Intravenous Q12H  . sodium chloride flush  3 mL Intravenous Q12H   Continuous Infusions: . sodium chloride 10 mL/hr at 06/08/16 1011   PRN Meds:.sodium chloride, sodium chloride, sodium chloride, alteplase, heparin, hydrALAZINE, oxyCODONE, oxyCODONE-acetaminophen, sodium chloride  flush  Micro Results Recent Results (from the past 240 hour(s))  Surgical pcr screen     Status: Abnormal   Collection Time: 06/08/16  2:33 AM  Result Value Ref Range Status   MRSA, PCR NEGATIVE NEGATIVE Final   Staphylococcus aureus POSITIVE (A) NEGATIVE Final    Comment:        The Xpert SA Assay (FDA approved for NASAL specimens in patients over 17 years of age), is one component of a comprehensive surveillance program.  Test performance has been validated by [facility] for patients greater than or equal to 64 year old. It is not intended to diagnose infection nor to guide or monitor treatment.     Radiology Reports US Renal  Result Date: 06/08/2016 CLINICAL DATA:  Acute renal failure. Elevated BUN and creatinine in the ED. History of hypertension, diabetes, chronic kidney disease. EXAM: RENAL / URINARY TRACT ULTRASOUND COMPLETE COMPARISON:  None. FINDINGS: Right Kidney: Length: 7.5 cm. Cystic lesion demonstrated in the mid pole measuring about 2.9 cm diameter. Mild complexity with central septation and slightly irregular wall. No solid mass or hydronephrosis. Left Kidney: Length: 9 cm. Cystic lesion demonstrated in the mid pole measuring 2.3 cm maximal diameter. No solid mass or hydronephrosis. Bladder: Bladder wall appears somewhat irregular diffusely. Filling defect is demonstrated in the base of the bladder which probably represents stone or debris but can't exclude polypoid lesion. Unable to reposition the patient to see whether this lesion loops. Prostatic impression upon the bladder base. IMPRESSION: Bilateral renal parenchymal atrophy consistent with chronic disease. Bilateral benign-appearing cysts. No hydronephrosis. Irregular thickening of the bladder wall with intraluminal filling defect. Can't exclude a polypoid lesion. Consider repeat imaging when patient is better able to move or cystoscopy for further evaluation. Electronically Signed   By: Lucienne Capers M.D.   On:  06/08/2016 03:42   Dg Chest Port 1 View  Result Date: 06/08/2016 CLINICAL DATA:  Status post dialysis catheter insertion EXAM: PORTABLE CHEST 1 VIEW COMPARISON:  03/11/2009 FINDINGS: Interval placement of right dialysis catheter. The tip is at the cavoatrial junction. No pneumothorax. Mild cardiomegaly. Lungs are clear. No effusions or edema. No acute bony abnormality. IMPRESSION: Right dialysis catheter placement with the tip at the cavoatrial junction. No pneumothorax. Electronically Signed   By: Rolm Baptise M.D.   On: 06/08/2016 13:33   Dg Fluoro Guide Cv Line-no Report  Result Date: 06/08/2016 CLINICAL DATA:  FLOURO GUIDE CV LINE Fluoroscopy was utilized by the requesting physician.  No radiographic interpretation.    Time Spent in minutes  25   Louellen Molder M.D on 06/09/2016 at 3:24 PM  Between 7am to 7pm - Pager - [phone]  After 7pm go to [email or web address] - password [facility]  [facility] -  Office  [phone]

## 2016-06-09 NOTE — Procedures (Signed)
HD #2 today 2.5 hours, 2K, 3.5 Ca, 250/500 Initiating HD via R IJ [facility] Labs pending  Jamal Maes, MD [facility] [phone] Pager 06/09/2016, 7:42 AM

## 2016-06-09 NOTE — Progress Notes (Signed)
[facility] Brief Note  Pt has outpatient dialysis set up at [facility] MWF 10:45 AM He can start there on Friday He would need to sign papers on Thursday afternoon, or can arrive early on Friday and sign paper before treatment starts.  Jamal Maes, MD [facility] [phone] Pager 06/09/2016, 3:54 PM

## 2016-06-09 NOTE — Telephone Encounter (Signed)
-----   Message from Mena Goes, RN sent at 06/08/2016  1:16 PM EDT ----- Regarding: schedule postop and duplex   ----- Message ----- From: Angelia Mould, MD Sent: 06/08/2016  12:35 PM To: Vvs Charge Pool Subject: charge and f/u                                 PROCEDURE:  1. Ultrasound-guided placement of right IJ tunneled dialysis catheter 2. Left upper arm radial cephalic AV fistula  SURGEON: Judeth Cornfield. Scot Dock, MD, FACS  ASSIST: None  He will need a follow up visit in 6 weeks with a duplex at that time to check on the maturation of his fistula. Thank you. CD

## 2016-06-10 DIAGNOSIS — D649 Anemia, unspecified: Secondary | ICD-10-CM | POA: Diagnosis present

## 2016-06-10 DIAGNOSIS — N189 Chronic kidney disease, unspecified: Secondary | ICD-10-CM

## 2016-06-10 DIAGNOSIS — Z95828 Presence of other vascular implants and grafts: Secondary | ICD-10-CM

## 2016-06-10 DIAGNOSIS — N186 End stage renal disease: Secondary | ICD-10-CM

## 2016-06-10 DIAGNOSIS — D631 Anemia in chronic kidney disease: Secondary | ICD-10-CM | POA: Diagnosis present

## 2016-06-10 DIAGNOSIS — Z992 Dependence on renal dialysis: Secondary | ICD-10-CM

## 2016-06-10 LAB — RENAL FUNCTION PANEL
ALBUMIN: 2.5 g/dL — AB (ref 3.5–5.0)
Anion gap: 11 (ref 5–15)
BUN: 72 mg/dL — AB (ref 6–20)
CO2: 23 mmol/L (ref 22–32)
Calcium: 7.3 mg/dL — ABNORMAL LOW (ref 8.9–10.3)
Chloride: 97 mmol/L — ABNORMAL LOW (ref 101–111)
Creatinine, Ser: 17.77 mg/dL — ABNORMAL HIGH (ref 0.61–1.24)
GFR calc Af Amer: 3 mL/min — ABNORMAL LOW (ref >60)
GFR calc non Af Amer: 3 mL/min — ABNORMAL LOW (ref >60)
GLUCOSE: 213 mg/dL — AB (ref 65–99)
PHOSPHORUS: 5.7 mg/dL — AB (ref 2.5–4.6)
POTASSIUM: 3.9 mmol/L (ref 3.5–5.1)
SODIUM: 131 mmol/L — AB (ref 135–145)

## 2016-06-10 LAB — CBC
HEMATOCRIT: 19.6 % — AB (ref 39.0–52.0)
Hemoglobin: 6.4 g/dL — CL (ref 13.0–17.0)
MCH: 29.4 pg (ref 26.0–34.0)
MCHC: 32.7 g/dL (ref 30.0–36.0)
MCV: 89.9 fL (ref 78.0–100.0)
Platelets: 211 10*3/uL (ref 150–400)
RBC: 2.18 MIL/uL — ABNORMAL LOW (ref 4.22–5.81)
RDW: 17.8 % — AB (ref 11.5–15.5)
WBC: 5.5 10*3/uL (ref 4.0–10.5)

## 2016-06-10 LAB — GLUCOSE, CAPILLARY: Glucose-Capillary: 96 mg/dL (ref 65–99)

## 2016-06-10 LAB — PREPARE RBC (CROSSMATCH)

## 2016-06-10 LAB — PARATHYROID HORMONE, INTACT (NO CA): PTH: 894 pg/mL — AB (ref 15–65)

## 2016-06-10 MED ORDER — ONDANSETRON HCL 4 MG/2ML IJ SOLN
4.0000 mg | Freq: Four times a day (QID) | INTRAMUSCULAR | Status: DC | PRN
  Filled 2016-06-10: qty 2

## 2016-06-10 MED ORDER — SODIUM CHLORIDE 0.9 % IV SOLN: Freq: Once | INTRAVENOUS | Status: DC

## 2016-06-10 MED ORDER — CALCITRIOL 0.5 MCG PO CAPS: 0.5000 ug | ORAL_CAPSULE | ORAL | 0 refills | Status: DC

## 2016-06-10 MED ORDER — CALCITRIOL 0.5 MCG PO CAPS: 0.5000 ug | ORAL_CAPSULE | ORAL | Status: DC

## 2016-06-10 MED ORDER — RENA-VITE PO TABS: 1.0000 | ORAL_TABLET | Freq: Every day | ORAL | 0 refills | Status: AC

## 2016-06-10 MED ORDER — OXYCODONE-ACETAMINOPHEN 5-325 MG PO TABS: 1.0000 | ORAL_TABLET | ORAL | 0 refills | Status: DC | PRN

## 2016-06-10 MED ORDER — CALCIUM ACETATE (PHOS BINDER) 667 MG PO CAPS: 667.0000 mg | ORAL_CAPSULE | Freq: Three times a day (TID) | ORAL | 0 refills | Status: DC

## 2016-06-10 NOTE — Progress Notes (Signed)
Educations given to this new renal patient about the renal diet,emphasis is given on the low potassium,low sodium and low phosphorus.Printed out the list of foods that are high potassium,high phosphorus and high sodium and therefore contraindicated for this patient.Discussed the importance of limiting the daily intake of his fluid to 1.2 Liters and explained the side effect for non compliance for on his diet and fluid limitation.Adised patient to hold his once a day medicines before his dialysis day and take it after his dialysis treatment.

## 2016-06-10 NOTE — Progress Notes (Signed)
[facility] Rounding Note  Subjective/Interval History:   TDC/AVF placed 9/19 (L upper arm RC AVF - anast to radial artery above the antecub level) Receiving 3rd HD today Has tolerated well L arm sore, AVF patent Advised of his outpt HD TMT assignment Getting 1 unit of blood on HD  Objective Vital signs in last 24 hours: Vitals:   06/09/16 1643 06/09/16 2016 06/10/16 0437 06/10/16 0725  BP: 132/68 (!) 116/58 (!) 116/57 136/76  Pulse: 95 94 74 84  Resp: 17 18 18 18   Temp: 99.3 F (37.4 C) 99.1 F (37.3 C) 98.6 F (37 C) 98.3 F (36.8 C)  TempSrc: Oral Oral Oral Oral  SpO2: 96% 96% 95% 99%  Weight:  79 kg (174 lb 2.6 oz)  78.2 kg (172 lb 6.4 oz)  Height:        Intake/Output Summary (Last 24 hours) at 06/10/16 0759 Last data filed at 06/10/16 0600  Gross per 24 hour  Intake             1080 ml  Output                0 ml  Net             1080 ml   Physical Exam:  Blood pressure 136/76, pulse 84, temperature 98.3 F (36.8 C), temperature source Oral, resp. rate 18, height 5\' 10"  (1.778 m), weight 78.2 kg (172 lb 6.4 oz), SpO2 99 %.  Seen on HD TDC R IJ in use, dry dressing Regular rhythm [id number] No S3 Systolic murmur USB no rub Lungs clear Soft abdomen No edema  L upper arm RC AVF (9/19) + bruit/thrill upper arm.  Some mild redness at incision  Labs: Basic Metabolic Panel:  Recent Labs Lab 06/07/16 1317 06/07/16 1722 06/07/16 2042 06/08/16 0330 06/09/16 0746 06/10/16 0414  NA 135 133* 131* 133* 134* 131*  K 4.5 5.0 4.6 4.9 4.1 3.9  CL 105 103 102 102 102 97*  CO2 14* 13* 12* 14* 19* 23  GLUCOSE 125* 164* 189* 275* 127* 213*  BUN 160* 161* 159* 161* 112* 72*  CREATININE 31.00* 31.70* 32.26* 31.95* 23.19* 17.77*  CALCIUM 7.0* 6.6* 6.9* 7.0* 7.7* 7.3*  PHOS  --   --  6.7*  --  5.8* 5.7*     Recent Labs Lab 06/07/16 1317 06/07/16 2042 06/09/16 0746 06/10/16 0414  AST 11*  --   --   --   ALT 14*  --   --   --   ALKPHOS 110  --   --   --   BILITOT 0.5  --    --   --   PROT 7.5  --   --   --   ALBUMIN 3.4* 3.3* 2.6* 2.5*    Recent Labs Lab 06/07/16 1317  LIPASE 316*     Recent Labs Lab 06/07/16 2043 06/08/16 0330 06/09/16 0746 06/10/16 0414  WBC 6.1 5.8 5.2 5.5  HGB 6.8* 7.5* 7.0* 6.4*  HCT 20.3* 21.9* 20.6* 19.6*  MCV 94.0 89.4 88.8 89.9  PLT 255 234 229 211    Recent Labs Lab 06/07/16 1722  CKTOTAL 542*     Recent Labs Lab 06/08/16 1706 06/08/16 2018 06/09/16 1132 06/09/16 1643 06/09/16 2020  GLUCAP 106* 183* 129* 308* 164*    Iron/TIBC/Ferritin/ %Sat    Component Value Date/Time   IRON 72 06/08/2016 1707   TIBC 224 (L) 06/08/2016 1707   FERRITIN 206 06/08/2016 1707   IRONPCTSAT  32 06/08/2016 1707   Results for SUEO, CULLEN (MRN [id number]) as of 06/10/2016 08:01   06/08/2016 20:07  PTH 894 (H)   Studies/Results: Dg Chest Port 1 View  Result Date: 06/08/2016 CLINICAL DATA:  Status post dialysis catheter insertion EXAM: PORTABLE CHEST 1 VIEW COMPARISON:  03/11/2009 FINDINGS: Interval placement of right dialysis catheter. The tip is at the cavoatrial junction. No pneumothorax. Mild cardiomegaly. Lungs are clear. No effusions or edema. No acute bony abnormality. IMPRESSION: Right dialysis catheter placement with the tip at the cavoatrial junction. No pneumothorax. Electronically Signed   By: Rolm Baptise M.D.   On: 06/08/2016 13:33   Dg Fluoro Guide Cv Line-no Report  Result Date: 06/08/2016 CLINICAL DATA:  FLOURO GUIDE CV LINE Fluoroscopy was utilized by the requesting physician.  No radiographic interpretation.   Medications: . sodium chloride 10 mL/hr at 06/08/16 1011   . sodium chloride   Intravenous Once  . amLODipine  10 mg Oral Daily  . calcitRIOL  0.5 mcg Oral Q T,Th,Sa-HD  . calcium acetate  667 mg Oral TID WC  . carvedilol  25 mg Oral BID WC  . Chlorhexidine Gluconate Cloth  6 each Topical Daily  . cloNIDine  0.1 mg Oral BID  . darbepoetin (ARANESP) injection - DIALYSIS  100 mcg Intravenous  Q Tue-HD  . heparin  5,000 Units Subcutaneous Q8H  . insulin aspart  0-9 Units Subcutaneous TID WC  . multivitamin  1 tablet Oral QHS  . mupirocin ointment  1 application Nasal BID  . pantoprazole  40 mg Oral Daily  . sodium chloride flush  3 mL Intravenous Q12H  . 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    1. New ESRD. 1st HD 06/08/16. 3rd HD today. Work up to 4 hour TMT time as outpt. Post weight today = EDW to start. L upper arm RC AVF and TDC both placed 9/19 (Dr.Dickson). Pt instructed to keep catheter dry and to avoid IV/needle sticks L arm. Outpt HD MWF [facility] to start on Friday 9/22 - arrive 30-45 minutes early to sign paperwork prior to 10:45 appt time.  2. Anemia, severe 2/2 renal failure (with adequate Fe stores). Aranesp 100 given 9/19. Transfused total of 2 units of PRBC's (2nd one today with HD)  3. Secondary HPT - calcium acetate + calcitriol 0.5 mcg TIW with HD. PTH 894.  4. HTN - controlled on amlodipine and carvedilol  5. DM2 - no meds  From renal standpoint can be discharged to home after HD today   Jamal Maes, MD [place] Pager 06/10/2016, 7:59 AM

## 2016-06-10 NOTE — Discharge Summary (Addendum)
Physician Discharge Summary  Jonathan Horton EPP:[id number] DOB: 03/19/1961 DOA: 06/07/2016  PCP: Philis Fendt, MD  Admit date: 06/07/2016 Discharge date: 06/10/2016  Admitted From: Home Disposition:  Home  Recommendations for Outpatient Follow-up:  Patient will start outpatient hemodialysis at [facility] from 06/11/2016 ( M,W.F). instrcuted to go there after discharge today or  at least 30-45 minutes prior to his first hemodialysis session tomorrow at 10:45 am to complete paperwork. Follow-up with PCP in 1 week. Please check hemoglobin in 1 week.  Home Health: None Equipment/Devices: None  Discharge Condition: Stable CODE STATUS: full code Diet recommendation: renal/ diabetic     Discharge Diagnoses:  Principal problem   ESRD on dialysis [facility])   Active Problems:   Acute renal failure (ARF) ([place])   Essential hypertension   Diabetes mellitus ([place])   Anemia of chronic kidney failure   S/P dialysis catheter insertion (HCC)   Brief narrative/history of present illness 55 year old male history of hypertension and diabetes mellitus presented with a one-week history of generalized weakness, nausea and poor appetite. In the ED he was found to be anemic with hemoglobin of 6.7 and serum creatinine of 31.7 with BUN of 161. Patient admitted to hospital service and started on hemodialysis.  Hospital course New end-stage renal disease - HD catheter placement and left arm AV fistula placement on 9/19. Has had total 3 sessions of dialysis. Established outpatient dialysis. Will start HD from 06/11/2016. Continue calcitriol, phoslo and MV -Percocet for Pain in his ST catheter and AV fistula site.  Anemia of kidney disease Drop in hemoglobin to 6.1 today. Received 1 unit PRBC with dialysis (total 2 units this admission). Iron studies suggest anemia of chronic disease.   Essential hypertension Stable. Resume home blood pressure medications.    Diabetes mellitus (HCC) Stable on sliding  scale coverage. Check A1c as outpatient.     Family Communication  Wife at bedside Disposition Plan  :  home  Consults nephrology Vascular surgery  Procedures  :  HD catheter placement Left AV fistula  Discharge Instructions     Medication List    STOP taking these medications   traMADol 50 MG tablet Commonly known as:  ULTRAM     TAKE these medications   amLODipine 10 MG tablet Commonly known as:  NORVASC Take 10 mg by mouth daily.   calcitRIOL 0.5 MCG capsule Commonly known as:  ROCALTROL Take 1 capsule (0.5 mcg total) by mouth every Monday, Wednesday, and Friday with hemodialysis. Start taking on:  06/11/2016   calcium acetate 667 MG capsule Commonly known as:  PHOSLO Take 1 capsule (667 mg total) by mouth 3 (three) times daily with meals.   carvedilol 25 MG tablet Commonly known as:  COREG Take 25 mg by mouth 2 (two) times daily.   cloNIDine 0.1 MG tablet Commonly known as:  CATAPRES Take 0.1 mg by mouth 2 (two) times daily.   multivitamin Tabs tablet Take 1 tablet by mouth at bedtime.   oxyCODONE-acetaminophen 5-325 MG tablet Commonly known as:  PERCOCET/ROXICET Take 1 tablet by mouth every 4 (four) hours as needed for severe pain.   pantoprazole 40 MG tablet Commonly known as:  PROTONIX Take 40 mg by mouth daily.      Follow-up Information    Deitra Mayo, MD Follow up in 6 week(s).   Specialties:  Vascular Surgery, Cardiology Why:  Office will call you to arrange your appt (sent) Contact information: [address] [place] [address] [phone]  Philis Fendt, MD. Schedule an appointment as soon as possible for a visit in 1 week(s).   Specialty:  Internal Medicine Contact information: [place] [address] [phone]          No Known Allergies     Procedures/Studies: US Renal  Result Date: 06/08/2016 CLINICAL DATA:  Acute renal failure. Elevated BUN and creatinine in the ED.  History of hypertension, diabetes, chronic kidney disease. EXAM: RENAL / URINARY TRACT ULTRASOUND COMPLETE COMPARISON:  None. FINDINGS: Right Kidney: Length: 7.5 cm. Cystic lesion demonstrated in the mid pole measuring about 2.9 cm diameter. Mild complexity with central septation and slightly irregular wall. No solid mass or hydronephrosis. Left Kidney: Length: 9 cm. Cystic lesion demonstrated in the mid pole measuring 2.3 cm maximal diameter. No solid mass or hydronephrosis. Bladder: Bladder wall appears somewhat irregular diffusely. Filling defect is demonstrated in the base of the bladder which probably represents stone or debris but can't exclude polypoid lesion. Unable to reposition the patient to see whether this lesion loops. Prostatic impression upon the bladder base. IMPRESSION: Bilateral renal parenchymal atrophy consistent with chronic disease. Bilateral benign-appearing cysts. No hydronephrosis. Irregular thickening of the bladder wall with intraluminal filling defect. Can't exclude a polypoid lesion. Consider repeat imaging when patient is better able to move or cystoscopy for further evaluation. Electronically Signed   By: Lucienne Capers M.D.   On: 06/08/2016 03:42   Dg Chest Port 1 View  Result Date: 06/08/2016 CLINICAL DATA:  Status post dialysis catheter insertion EXAM: PORTABLE CHEST 1 VIEW COMPARISON:  03/11/2009 FINDINGS: Interval placement of right dialysis catheter. The tip is at the cavoatrial junction. No pneumothorax. Mild cardiomegaly. Lungs are clear. No effusions or edema. No acute bony abnormality. IMPRESSION: Right dialysis catheter placement with the tip at the cavoatrial junction. No pneumothorax. Electronically Signed   By: Rolm Baptise M.D.   On: 06/08/2016 13:33   Dg Fluoro Guide Cv Line-no Report  Result Date: 06/08/2016 CLINICAL DATA:  FLOURO GUIDE CV LINE Fluoroscopy was utilized by the requesting physician.  No radiographic interpretation.        Subjective: Seen after dialysis. Had some nausea right after dialysis. No other symptoms.   Discharge Exam: Vitals:   06/10/16 1034 06/10/16 1100  BP: (!) 161/90 (!) 142/75  Pulse: 86 89  Resp: 12 18  Temp: 98.7 F (37.1 C) 99.1 F (37.3 C)   Vitals:   06/10/16 0940 06/10/16 1000 06/10/16 1034 06/10/16 1100  BP: (!) 153/82 (!) 146/85 (!) 161/90 (!) 142/75  Pulse: 84 89 86 89  Resp: 14 12 12 18   Temp: 98.4 F (36.9 C)  98.7 F (37.1 C) 99.1 F (37.3 C)  TempSrc: Oral  Oral Oral  SpO2:   99% 100%  Weight:      Height:         Gen: not in distress HEENT:Moist mucosa, supple neck Chest: clear b/l, no added sounds CVS: N S1&S2, no murmurs, Rt HD catheter  GI: soft, NT, ND, BS+ Musculoskeletal: warm, no edema, left UE AVF CNS: Alert and oriented   The results of significant diagnostics from this hospitalization (including imaging, microbiology, ancillary and laboratory) are listed below for reference.     Microbiology: Recent Results (from the past 240 hour(s))  Surgical pcr screen     Status: Abnormal   Collection Time: 06/08/16  2:33 AM  Result Value Ref Range Status   MRSA, PCR NEGATIVE NEGATIVE Final   Staphylococcus aureus POSITIVE (A) NEGATIVE  Final    Comment:        The Xpert SA Assay (FDA approved for NASAL specimens in patients over 61 years of age), is one component of a comprehensive surveillance program.  Test performance has been validated by [facility] for patients greater than or equal to 53 year old. It is not intended to diagnose infection nor to guide or monitor treatment.      Labs: BNP (last 3 results) No results for input(s): BNP in the last 8760 hours. Basic Metabolic Panel:  Recent Labs Lab 06/07/16 1722 06/07/16 2042 06/08/16 0330 06/09/16 0746 06/10/16 0414  NA 133* 131* 133* 134* 131*  K 5.0 4.6 4.9 4.1 3.9  CL 103 102 102 102 97*  CO2 13* 12* 14* 19* 23  GLUCOSE 164* 189* 275* 127* 213*  BUN 161* 159*  161* 112* 72*  CREATININE 31.70* 32.26* 31.95* 23.19* 17.77*  CALCIUM 6.6* 6.9* 7.0* 7.7* 7.3*  PHOS  --  6.7*  --  5.8* 5.7*   Liver Function Tests:  Recent Labs Lab 06/07/16 1317 06/07/16 2042 06/09/16 0746 06/10/16 0414  AST 11*  --   --   --   ALT 14*  --   --   --   ALKPHOS 110  --   --   --   BILITOT 0.5  --   --   --   PROT 7.5  --   --   --   ALBUMIN 3.4* 3.3* 2.6* 2.5*    Recent Labs Lab 06/07/16 1317  LIPASE 316*   No results for input(s): AMMONIA in the last 168 hours. CBC:  Recent Labs Lab 06/07/16 1317 06/07/16 2043 06/08/16 0330 06/09/16 0746 06/10/16 0414  WBC 5.9 6.1 5.8 5.2 5.5  HGB 6.7* 6.8* 7.5* 7.0* 6.4*  HCT 20.2* 20.3* 21.9* 20.6* 19.6*  MCV 94.8 94.0 89.4 88.8 89.9  PLT 288 255 234 229 211   Cardiac Enzymes:  Recent Labs Lab 06/07/16 1722  CKTOTAL 542*   BNP: Invalid input(s): POCBNP CBG:  Recent Labs Lab 06/08/16 2018 06/09/16 1132 06/09/16 1643 06/09/16 2020 06/10/16 1151  GLUCAP 183* 129* 308* 164* 96   D-Dimer No results for input(s): DDIMER in the last 72 hours. Hgb A1c No results for input(s): HGBA1C in the last 72 hours. Lipid Profile No results for input(s): CHOL, HDL, LDLCALC, TRIG, CHOLHDL, LDLDIRECT in the last 72 hours. Thyroid function studies No results for input(s): TSH, T4TOTAL, T3FREE, THYROIDAB in the last 72 hours.  Invalid input(s): FREET3 Anemia work up  Recent Labs  06/08/16 1707  FERRITIN 206  TIBC 224*  IRON 72   Urinalysis    Component Value Date/Time   COLORURINE YELLOW 06/07/2016 [facility] 06/07/2016 1727   LABSPEC 1.010 06/07/2016 1727   PHURINE 5.5 06/07/2016 1727   GLUCOSEU 100 (A) 06/07/2016 1727   HGBUR SMALL (A) 06/07/2016 1727   BILIRUBINUR NEGATIVE 06/07/2016 1727   KETONESUR NEGATIVE 06/07/2016 1727   PROTEINUR >300 (A) 06/07/2016 1727   NITRITE NEGATIVE 06/07/2016 1727   LEUKOCYTESUR NEGATIVE 06/07/2016 1727   Sepsis Labs Invalid input(s):  PROCALCITONIN,  WBC,  LACTICIDVEN Microbiology Recent Results (from the past 240 hour(s))  Surgical pcr screen     Status: Abnormal   Collection Time: 06/08/16  2:33 AM  Result Value Ref Range Status   MRSA, PCR NEGATIVE NEGATIVE Final   Staphylococcus aureus POSITIVE (A) NEGATIVE Final    Comment:        The Xpert  SA Assay (FDA approved for NASAL specimens in patients over 86 years of age), is one component of a comprehensive surveillance program.  Test performance has been validated by [facility] for patients greater than or equal to 67 year old. It is not intended to diagnose infection nor to guide or monitor treatment.      Time coordinating discharge: Over 30 minutes  SIGNED:   Louellen Molder, MD  [facility] 06/10/2016, 2:08 PM Pager   If 7PM-7AM, please contact night-coverage [email or web address] Password [email or web address]

## 2016-06-10 NOTE — Procedures (Signed)
HD #3 today Change to 4K bath for K 3.9 Hb 6.4 - for 1 unit PRBC's Receiving no heparin 300/500 and keep even today  OK to go home after HD from my standpoint  Jamal Maes, MD [place] Pager 06/10/2016, 8:11 AM

## 2016-06-11 DIAGNOSIS — T8249XD Other complication of vascular dialysis catheter, subsequent encounter: Secondary | ICD-10-CM | POA: Diagnosis not present

## 2016-06-11 DIAGNOSIS — N186 End stage renal disease: Secondary | ICD-10-CM | POA: Diagnosis not present

## 2016-06-11 LAB — TYPE AND SCREEN
ABO/RH(D): AB POS
Antibody Screen: NEGATIVE
UNIT DIVISION: 0
Unit division: 0

## 2016-06-11 LAB — HEMOGLOBIN A1C
HEMOGLOBIN A1C: 5.6 % (ref 4.8–5.6)
Mean Plasma Glucose: 114 mg/dL

## 2016-06-14 DIAGNOSIS — T8249XD Other complication of vascular dialysis catheter, subsequent encounter: Secondary | ICD-10-CM | POA: Diagnosis not present

## 2016-06-14 DIAGNOSIS — N186 End stage renal disease: Secondary | ICD-10-CM | POA: Diagnosis not present

## 2016-06-14 DIAGNOSIS — Z23 Encounter for immunization: Secondary | ICD-10-CM | POA: Diagnosis not present

## 2016-06-15 DIAGNOSIS — E1121 Type 2 diabetes mellitus with diabetic nephropathy: Secondary | ICD-10-CM | POA: Diagnosis not present

## 2016-06-15 DIAGNOSIS — K219 Gastro-esophageal reflux disease without esophagitis: Secondary | ICD-10-CM | POA: Diagnosis not present

## 2016-06-15 DIAGNOSIS — I10 Essential (primary) hypertension: Secondary | ICD-10-CM | POA: Diagnosis not present

## 2016-06-15 DIAGNOSIS — N185 Chronic kidney disease, stage 5: Secondary | ICD-10-CM | POA: Diagnosis not present

## 2016-06-16 DIAGNOSIS — N186 End stage renal disease: Secondary | ICD-10-CM | POA: Diagnosis not present

## 2016-06-16 DIAGNOSIS — Z23 Encounter for immunization: Secondary | ICD-10-CM | POA: Diagnosis not present

## 2016-06-16 DIAGNOSIS — T8249XD Other complication of vascular dialysis catheter, subsequent encounter: Secondary | ICD-10-CM | POA: Diagnosis not present

## 2016-06-18 DIAGNOSIS — Z23 Encounter for immunization: Secondary | ICD-10-CM | POA: Diagnosis not present

## 2016-06-18 DIAGNOSIS — N186 End stage renal disease: Secondary | ICD-10-CM | POA: Diagnosis not present

## 2016-06-18 DIAGNOSIS — T8249XD Other complication of vascular dialysis catheter, subsequent encounter: Secondary | ICD-10-CM | POA: Diagnosis not present

## 2016-06-19 DIAGNOSIS — I129 Hypertensive chronic kidney disease with stage 1 through stage 4 chronic kidney disease, or unspecified chronic kidney disease: Secondary | ICD-10-CM | POA: Diagnosis not present

## 2016-06-19 DIAGNOSIS — N186 End stage renal disease: Secondary | ICD-10-CM | POA: Diagnosis not present

## 2016-06-19 DIAGNOSIS — Z992 Dependence on renal dialysis: Secondary | ICD-10-CM | POA: Diagnosis not present

## 2016-06-21 DIAGNOSIS — T8249XD Other complication of vascular dialysis catheter, subsequent encounter: Secondary | ICD-10-CM | POA: Diagnosis not present

## 2016-06-21 DIAGNOSIS — N186 End stage renal disease: Secondary | ICD-10-CM | POA: Diagnosis not present

## 2016-06-23 DIAGNOSIS — N186 End stage renal disease: Secondary | ICD-10-CM | POA: Diagnosis not present

## 2016-06-23 DIAGNOSIS — T8249XD Other complication of vascular dialysis catheter, subsequent encounter: Secondary | ICD-10-CM | POA: Diagnosis not present

## 2016-06-25 DIAGNOSIS — T8249XD Other complication of vascular dialysis catheter, subsequent encounter: Secondary | ICD-10-CM | POA: Diagnosis not present

## 2016-06-25 DIAGNOSIS — N186 End stage renal disease: Secondary | ICD-10-CM | POA: Diagnosis not present

## 2016-06-28 DIAGNOSIS — N186 End stage renal disease: Secondary | ICD-10-CM | POA: Diagnosis not present

## 2016-06-28 DIAGNOSIS — T8249XD Other complication of vascular dialysis catheter, subsequent encounter: Secondary | ICD-10-CM | POA: Diagnosis not present

## 2016-06-30 DIAGNOSIS — N186 End stage renal disease: Secondary | ICD-10-CM | POA: Diagnosis not present

## 2016-06-30 DIAGNOSIS — T8249XD Other complication of vascular dialysis catheter, subsequent encounter: Secondary | ICD-10-CM | POA: Diagnosis not present

## 2016-07-02 DIAGNOSIS — T8249XD Other complication of vascular dialysis catheter, subsequent encounter: Secondary | ICD-10-CM | POA: Diagnosis not present

## 2016-07-02 DIAGNOSIS — N186 End stage renal disease: Secondary | ICD-10-CM | POA: Diagnosis not present

## 2016-07-05 DIAGNOSIS — T8249XD Other complication of vascular dialysis catheter, subsequent encounter: Secondary | ICD-10-CM | POA: Diagnosis not present

## 2016-07-05 DIAGNOSIS — N186 End stage renal disease: Secondary | ICD-10-CM | POA: Diagnosis not present

## 2016-07-07 DIAGNOSIS — T8249XD Other complication of vascular dialysis catheter, subsequent encounter: Secondary | ICD-10-CM | POA: Diagnosis not present

## 2016-07-07 DIAGNOSIS — N186 End stage renal disease: Secondary | ICD-10-CM | POA: Diagnosis not present

## 2016-07-09 DIAGNOSIS — T8249XD Other complication of vascular dialysis catheter, subsequent encounter: Secondary | ICD-10-CM | POA: Diagnosis not present

## 2016-07-09 DIAGNOSIS — N186 End stage renal disease: Secondary | ICD-10-CM | POA: Diagnosis not present

## 2016-07-12 DIAGNOSIS — T8249XD Other complication of vascular dialysis catheter, subsequent encounter: Secondary | ICD-10-CM | POA: Diagnosis not present

## 2016-07-12 DIAGNOSIS — N186 End stage renal disease: Secondary | ICD-10-CM | POA: Diagnosis not present

## 2016-07-14 DIAGNOSIS — N186 End stage renal disease: Secondary | ICD-10-CM | POA: Diagnosis not present

## 2016-07-14 DIAGNOSIS — T8249XD Other complication of vascular dialysis catheter, subsequent encounter: Secondary | ICD-10-CM | POA: Diagnosis not present

## 2016-07-16 DIAGNOSIS — T8249XD Other complication of vascular dialysis catheter, subsequent encounter: Secondary | ICD-10-CM | POA: Diagnosis not present

## 2016-07-16 DIAGNOSIS — N186 End stage renal disease: Secondary | ICD-10-CM | POA: Diagnosis not present

## 2016-07-19 DIAGNOSIS — N186 End stage renal disease: Secondary | ICD-10-CM | POA: Diagnosis not present

## 2016-07-19 DIAGNOSIS — T8249XD Other complication of vascular dialysis catheter, subsequent encounter: Secondary | ICD-10-CM | POA: Diagnosis not present

## 2016-07-20 DIAGNOSIS — N186 End stage renal disease: Secondary | ICD-10-CM | POA: Diagnosis not present

## 2016-07-20 DIAGNOSIS — Z992 Dependence on renal dialysis: Secondary | ICD-10-CM | POA: Diagnosis not present

## 2016-07-20 DIAGNOSIS — I129 Hypertensive chronic kidney disease with stage 1 through stage 4 chronic kidney disease, or unspecified chronic kidney disease: Secondary | ICD-10-CM | POA: Diagnosis not present

## 2016-07-21 ENCOUNTER — Ambulatory Visit: Payer: BLUE CROSS/BLUE SHIELD | Admitting: Vascular Surgery

## 2016-07-21 ENCOUNTER — Encounter (HOSPITAL_COMMUNITY): Payer: BLUE CROSS/BLUE SHIELD

## 2016-07-21 DIAGNOSIS — T8249XD Other complication of vascular dialysis catheter, subsequent encounter: Secondary | ICD-10-CM | POA: Diagnosis not present

## 2016-07-21 DIAGNOSIS — N186 End stage renal disease: Secondary | ICD-10-CM | POA: Diagnosis not present

## 2016-07-23 DIAGNOSIS — T8249XD Other complication of vascular dialysis catheter, subsequent encounter: Secondary | ICD-10-CM | POA: Diagnosis not present

## 2016-07-23 DIAGNOSIS — N186 End stage renal disease: Secondary | ICD-10-CM | POA: Diagnosis not present

## 2016-07-26 DIAGNOSIS — T8249XD Other complication of vascular dialysis catheter, subsequent encounter: Secondary | ICD-10-CM | POA: Diagnosis not present

## 2016-07-26 DIAGNOSIS — N186 End stage renal disease: Secondary | ICD-10-CM | POA: Diagnosis not present

## 2016-07-27 ENCOUNTER — Encounter: Payer: Self-pay | Admitting: Vascular Surgery

## 2016-07-28 ENCOUNTER — Ambulatory Visit (INDEPENDENT_AMBULATORY_CARE_PROVIDER_SITE_OTHER): Payer: Self-pay | Admitting: Vascular Surgery

## 2016-07-28 ENCOUNTER — Encounter: Payer: Self-pay | Admitting: Vascular Surgery

## 2016-07-28 ENCOUNTER — Ambulatory Visit (HOSPITAL_COMMUNITY)
Admission: RE | Admit: 2016-07-28 | Discharge: 2016-07-28 | Disposition: A | Discharge status: Home / Self Care | Payer: BLUE CROSS/BLUE SHIELD | Source: Ambulatory Visit | Attending: Vascular Surgery | Admitting: Vascular Surgery

## 2016-07-28 VITALS — BP 166/87 | HR 80 | Temp 98.4°F | Resp 20 | Ht 70.0 in | Wt 175.2 lb

## 2016-07-28 DIAGNOSIS — Z4931 Encounter for adequacy testing for hemodialysis: Secondary | ICD-10-CM | POA: Diagnosis not present

## 2016-07-28 DIAGNOSIS — N186 End stage renal disease: Secondary | ICD-10-CM | POA: Diagnosis not present

## 2016-07-28 DIAGNOSIS — T8249XD Other complication of vascular dialysis catheter, subsequent encounter: Secondary | ICD-10-CM | POA: Diagnosis not present

## 2016-07-28 DIAGNOSIS — Z992 Dependence on renal dialysis: Secondary | ICD-10-CM

## 2016-07-28 NOTE — Progress Notes (Signed)
   Patient name: Jonathan Horton MRN: [id number] DOB: 1960/11/23 Sex: male  REASON FOR VISIT: Follow up of left radiocephalic AV fistula.  HPI: Ashwath Lasch is a 55 y.o. male who had a left radiocephalic AV fistula placed on 06/08/2016. This patient had placement of a right IJ tunneled dialysis catheter and a left upper arm radiocephalic fistula on 44/31/5400. The upper arm cephalic vein was 3.5 mm in diameter. The patient had a high bifurcation of the brachial artery. The anastomosis was to the radial artery just above the antecubital level.  Current Outpatient Prescriptions  Medication Sig Dispense Refill  . amLODipine (NORVASC) 10 MG tablet Take 10 mg by mouth daily.    . calcitRIOL (ROCALTROL) 0.5 MCG capsule Take 1 capsule (0.5 mcg total) by mouth every Monday, Wednesday, and Friday with hemodialysis. 30 capsule 0  . calcium acetate (PHOSLO) 667 MG capsule Take 1 capsule (667 mg total) by mouth 3 (three) times daily with meals. 90 capsule 0  . carvedilol (COREG) 25 MG tablet Take 25 mg by mouth 2 (two) times daily.    . cloNIDine (CATAPRES) 0.1 MG tablet Take 0.1 mg by mouth 2 (two) times daily.    . multivitamin (RENA-VIT) TABS tablet Take 1 tablet by mouth at bedtime. 30 tablet 0  . pantoprazole (PROTONIX) 40 MG tablet Take 40 mg by mouth daily.    . traMADol (ULTRAM) 50 MG tablet Take by mouth.    . oxyCODONE-acetaminophen (PERCOCET/ROXICET) 5-325 MG tablet Take 1 tablet by mouth every 4 (four) hours as needed for severe pain. (Patient not taking: Reported on 07/28/2016) 15 tablet 0   No current facility-administered medications for this visit.     REVIEW OF SYSTEMS:  [X]  denotes positive finding, [ ]  denotes negative finding Cardiac  Comments:  Chest pain or chest pressure:    Shortness of breath upon exertion:    Short of breath when lying flat:    Irregular heart rhythm:    Constitutional    Fever or chills:      PHYSICAL EXAM: Vitals:   07/28/16 0842 07/28/16 0844  BP: (!)  164/88 (!) 166/87  Pulse: 80   Resp: 20   Temp: 98.4 F (36.9 C)   TempSrc: Oral   SpO2: 99%   Weight: 175 lb 3.2 oz (79.5 kg)   Height: 5\' 10"  (1.778 m)     GENERAL: The patient is a well-nourished male, in no acute distress. The vital signs are documented above. CARDIOVASCULAR: There is a regular rate and rhythm. PULMONARY: There is good air exchange bilaterally without wheezing or rales. This fistula has an excellent thrill and bruit. His incision has healed nicely. He has a palpable left radial pulse.  DUPLEX OF LEFT AV FISTULA: Duplex of the fistula shows that the diameters of the fistula range from 0.55-0.57 cm.  Depths are very reasonable.   MEDICAL ISSUES:  STATUS POST LEFT RADIOCEPHALIC UPPER ARM FISTULA: This fistula appears to be maturing adequately and should be ready for cannulation in 6 weeks. Once his fistula is working arrangements can be made to have his catheter removed. I will see him back as needed.  Deitra Mayo [facility] [phone]

## 2016-07-30 DIAGNOSIS — T8249XD Other complication of vascular dialysis catheter, subsequent encounter: Secondary | ICD-10-CM | POA: Diagnosis not present

## 2016-07-30 DIAGNOSIS — N186 End stage renal disease: Secondary | ICD-10-CM | POA: Diagnosis not present

## 2016-08-02 DIAGNOSIS — T8249XD Other complication of vascular dialysis catheter, subsequent encounter: Secondary | ICD-10-CM | POA: Diagnosis not present

## 2016-08-02 DIAGNOSIS — N186 End stage renal disease: Secondary | ICD-10-CM | POA: Diagnosis not present

## 2016-08-04 DIAGNOSIS — T8249XD Other complication of vascular dialysis catheter, subsequent encounter: Secondary | ICD-10-CM | POA: Diagnosis not present

## 2016-08-04 DIAGNOSIS — N186 End stage renal disease: Secondary | ICD-10-CM | POA: Diagnosis not present

## 2016-08-06 DIAGNOSIS — T8249XD Other complication of vascular dialysis catheter, subsequent encounter: Secondary | ICD-10-CM | POA: Diagnosis not present

## 2016-08-06 DIAGNOSIS — N186 End stage renal disease: Secondary | ICD-10-CM | POA: Diagnosis not present

## 2016-08-08 DIAGNOSIS — N186 End stage renal disease: Secondary | ICD-10-CM | POA: Diagnosis not present

## 2016-08-08 DIAGNOSIS — T8249XD Other complication of vascular dialysis catheter, subsequent encounter: Secondary | ICD-10-CM | POA: Diagnosis not present

## 2016-08-10 DIAGNOSIS — T8249XD Other complication of vascular dialysis catheter, subsequent encounter: Secondary | ICD-10-CM | POA: Diagnosis not present

## 2016-08-10 DIAGNOSIS — N186 End stage renal disease: Secondary | ICD-10-CM | POA: Diagnosis not present

## 2016-08-13 DIAGNOSIS — T8249XD Other complication of vascular dialysis catheter, subsequent encounter: Secondary | ICD-10-CM | POA: Diagnosis not present

## 2016-08-13 DIAGNOSIS — N186 End stage renal disease: Secondary | ICD-10-CM | POA: Diagnosis not present

## 2016-08-16 DIAGNOSIS — N186 End stage renal disease: Secondary | ICD-10-CM | POA: Diagnosis not present

## 2016-08-16 DIAGNOSIS — T8249XD Other complication of vascular dialysis catheter, subsequent encounter: Secondary | ICD-10-CM | POA: Diagnosis not present

## 2016-08-18 DIAGNOSIS — T8249XD Other complication of vascular dialysis catheter, subsequent encounter: Secondary | ICD-10-CM | POA: Diagnosis not present

## 2016-08-18 DIAGNOSIS — N186 End stage renal disease: Secondary | ICD-10-CM | POA: Diagnosis not present

## 2016-08-19 DIAGNOSIS — N186 End stage renal disease: Secondary | ICD-10-CM | POA: Diagnosis not present

## 2016-08-19 DIAGNOSIS — Z992 Dependence on renal dialysis: Secondary | ICD-10-CM | POA: Diagnosis not present

## 2016-08-19 DIAGNOSIS — I129 Hypertensive chronic kidney disease with stage 1 through stage 4 chronic kidney disease, or unspecified chronic kidney disease: Secondary | ICD-10-CM | POA: Diagnosis not present

## 2016-08-20 DIAGNOSIS — N186 End stage renal disease: Secondary | ICD-10-CM | POA: Diagnosis not present

## 2016-08-20 DIAGNOSIS — T8249XD Other complication of vascular dialysis catheter, subsequent encounter: Secondary | ICD-10-CM | POA: Diagnosis not present

## 2016-08-23 DIAGNOSIS — T8249XD Other complication of vascular dialysis catheter, subsequent encounter: Secondary | ICD-10-CM | POA: Diagnosis not present

## 2016-08-23 DIAGNOSIS — N186 End stage renal disease: Secondary | ICD-10-CM | POA: Diagnosis not present

## 2016-08-25 DIAGNOSIS — N186 End stage renal disease: Secondary | ICD-10-CM | POA: Diagnosis not present

## 2016-08-25 DIAGNOSIS — T8249XD Other complication of vascular dialysis catheter, subsequent encounter: Secondary | ICD-10-CM | POA: Diagnosis not present

## 2016-08-27 DIAGNOSIS — T8249XD Other complication of vascular dialysis catheter, subsequent encounter: Secondary | ICD-10-CM | POA: Diagnosis not present

## 2016-08-27 DIAGNOSIS — N186 End stage renal disease: Secondary | ICD-10-CM | POA: Diagnosis not present

## 2016-08-30 DIAGNOSIS — N186 End stage renal disease: Secondary | ICD-10-CM | POA: Diagnosis not present

## 2016-08-30 DIAGNOSIS — T8249XD Other complication of vascular dialysis catheter, subsequent encounter: Secondary | ICD-10-CM | POA: Diagnosis not present

## 2016-08-31 DIAGNOSIS — M25512 Pain in left shoulder: Secondary | ICD-10-CM | POA: Diagnosis not present

## 2016-08-31 DIAGNOSIS — Z23 Encounter for immunization: Secondary | ICD-10-CM | POA: Diagnosis not present

## 2016-08-31 DIAGNOSIS — E1121 Type 2 diabetes mellitus with diabetic nephropathy: Secondary | ICD-10-CM | POA: Diagnosis not present

## 2016-08-31 DIAGNOSIS — I10 Essential (primary) hypertension: Secondary | ICD-10-CM | POA: Diagnosis not present

## 2016-08-31 DIAGNOSIS — N185 Chronic kidney disease, stage 5: Secondary | ICD-10-CM | POA: Diagnosis not present

## 2016-09-01 DIAGNOSIS — N186 End stage renal disease: Secondary | ICD-10-CM | POA: Diagnosis not present

## 2016-09-01 DIAGNOSIS — T8249XD Other complication of vascular dialysis catheter, subsequent encounter: Secondary | ICD-10-CM | POA: Diagnosis not present

## 2016-09-03 DIAGNOSIS — N186 End stage renal disease: Secondary | ICD-10-CM | POA: Diagnosis not present

## 2016-09-03 DIAGNOSIS — T8249XD Other complication of vascular dialysis catheter, subsequent encounter: Secondary | ICD-10-CM | POA: Diagnosis not present

## 2016-09-06 DIAGNOSIS — T8249XD Other complication of vascular dialysis catheter, subsequent encounter: Secondary | ICD-10-CM | POA: Diagnosis not present

## 2016-09-06 DIAGNOSIS — N186 End stage renal disease: Secondary | ICD-10-CM | POA: Diagnosis not present

## 2016-09-08 DIAGNOSIS — N186 End stage renal disease: Secondary | ICD-10-CM | POA: Diagnosis not present

## 2016-09-08 DIAGNOSIS — T8249XD Other complication of vascular dialysis catheter, subsequent encounter: Secondary | ICD-10-CM | POA: Diagnosis not present

## 2016-09-10 DIAGNOSIS — N186 End stage renal disease: Secondary | ICD-10-CM | POA: Diagnosis not present

## 2016-09-10 DIAGNOSIS — T8249XD Other complication of vascular dialysis catheter, subsequent encounter: Secondary | ICD-10-CM | POA: Diagnosis not present

## 2016-09-12 DIAGNOSIS — N186 End stage renal disease: Secondary | ICD-10-CM | POA: Diagnosis not present

## 2016-09-12 DIAGNOSIS — T8249XD Other complication of vascular dialysis catheter, subsequent encounter: Secondary | ICD-10-CM | POA: Diagnosis not present

## 2016-09-15 DIAGNOSIS — N186 End stage renal disease: Secondary | ICD-10-CM | POA: Diagnosis not present

## 2016-09-15 DIAGNOSIS — T8249XD Other complication of vascular dialysis catheter, subsequent encounter: Secondary | ICD-10-CM | POA: Diagnosis not present

## 2016-09-17 DIAGNOSIS — T8249XD Other complication of vascular dialysis catheter, subsequent encounter: Secondary | ICD-10-CM | POA: Diagnosis not present

## 2016-09-17 DIAGNOSIS — N186 End stage renal disease: Secondary | ICD-10-CM | POA: Diagnosis not present

## 2016-09-19 ENCOUNTER — Emergency Department (HOSPITAL_COMMUNITY): Payer: BLUE CROSS/BLUE SHIELD

## 2016-09-19 ENCOUNTER — Encounter (HOSPITAL_COMMUNITY): Payer: Self-pay | Admitting: *Deleted

## 2016-09-19 ENCOUNTER — Emergency Department (HOSPITAL_COMMUNITY)
Admission: EM | Admit: 2016-09-19 | Discharge: 2016-09-19 | Disposition: A | Discharge status: Home / Self Care | Payer: BLUE CROSS/BLUE SHIELD | Attending: Emergency Medicine | Admitting: Emergency Medicine

## 2016-09-19 DIAGNOSIS — N186 End stage renal disease: Secondary | ICD-10-CM | POA: Diagnosis not present

## 2016-09-19 DIAGNOSIS — I129 Hypertensive chronic kidney disease with stage 1 through stage 4 chronic kidney disease, or unspecified chronic kidney disease: Secondary | ICD-10-CM | POA: Diagnosis not present

## 2016-09-19 DIAGNOSIS — I120 Hypertensive chronic kidney disease with stage 5 chronic kidney disease or end stage renal disease: Secondary | ICD-10-CM | POA: Insufficient documentation

## 2016-09-19 DIAGNOSIS — T8249XD Other complication of vascular dialysis catheter, subsequent encounter: Secondary | ICD-10-CM | POA: Diagnosis not present

## 2016-09-19 DIAGNOSIS — R509 Fever, unspecified: Secondary | ICD-10-CM | POA: Diagnosis not present

## 2016-09-19 DIAGNOSIS — E1122 Type 2 diabetes mellitus with diabetic chronic kidney disease: Secondary | ICD-10-CM | POA: Insufficient documentation

## 2016-09-19 DIAGNOSIS — Z992 Dependence on renal dialysis: Secondary | ICD-10-CM | POA: Insufficient documentation

## 2016-09-19 LAB — COMPREHENSIVE METABOLIC PANEL
ALT: 17 U/L (ref 17–63)
AST: 32 U/L (ref 15–41)
Albumin: 2.6 g/dL — ABNORMAL LOW (ref 3.5–5.0)
Alkaline Phosphatase: 87 U/L (ref 38–126)
Anion gap: 10 (ref 5–15)
BUN: 18 mg/dL (ref 6–20)
CHLORIDE: 95 mmol/L — AB (ref 101–111)
CO2: 25 mmol/L (ref 22–32)
CREATININE: 5.49 mg/dL — AB (ref 0.61–1.24)
Calcium: 10.3 mg/dL (ref 8.9–10.3)
GFR calc Af Amer: 12 mL/min — ABNORMAL LOW (ref >60)
GFR calc non Af Amer: 11 mL/min — ABNORMAL LOW (ref >60)
Glucose, Bld: 323 mg/dL — ABNORMAL HIGH (ref 65–99)
Potassium: 3.8 mmol/L (ref 3.5–5.1)
SODIUM: 130 mmol/L — AB (ref 135–145)
Total Bilirubin: 1 mg/dL (ref 0.3–1.2)
Total Protein: 7.3 g/dL (ref 6.5–8.1)

## 2016-09-19 LAB — URINALYSIS, ROUTINE W REFLEX MICROSCOPIC
BILIRUBIN URINE: NEGATIVE
Glucose, UA: >=500 mg/dL — AB
Hgb urine dipstick: NEGATIVE
KETONES UR: NEGATIVE mg/dL
NITRITE: NEGATIVE
SPECIFIC GRAVITY, URINE: 1.007 (ref 1.005–1.030)
pH: 7 (ref 5.0–8.0)

## 2016-09-19 LAB — CBC WITH DIFFERENTIAL/PLATELET
Basophils Absolute: 0 10*3/uL (ref 0.0–0.1)
Basophils Relative: 0 %
EOS PCT: 2 %
Eosinophils Absolute: 0.2 10*3/uL (ref 0.0–0.7)
HEMATOCRIT: 24.4 % — AB (ref 39.0–52.0)
Hemoglobin: 8.1 g/dL — ABNORMAL LOW (ref 13.0–17.0)
LYMPHS ABS: 0.7 10*3/uL (ref 0.7–4.0)
LYMPHS PCT: 7 %
MCH: 30.6 pg (ref 26.0–34.0)
MCHC: 33.2 g/dL (ref 30.0–36.0)
MCV: 92.1 fL (ref 78.0–100.0)
MONO ABS: 0.6 10*3/uL (ref 0.1–1.0)
Monocytes Relative: 7 %
Neutro Abs: 7.5 10*3/uL (ref 1.7–7.7)
Neutrophils Relative %: 84 %
PLATELETS: 359 10*3/uL (ref 150–400)
RBC: 2.65 MIL/uL — AB (ref 4.22–5.81)
RDW: 16 % — ABNORMAL HIGH (ref 11.5–15.5)
WBC: 9 10*3/uL (ref 4.0–10.5)

## 2016-09-19 LAB — PROTIME-INR
INR: 1
Prothrombin Time: 13.2 seconds (ref 11.4–15.2)

## 2016-09-19 LAB — I-STAT CG4 LACTIC ACID, ED: Lactic Acid, Venous: 0.97 mmol/L (ref 0.5–1.9)

## 2016-09-19 MED ORDER — VANCOMYCIN HCL IN DEXTROSE 1-5 GM/200ML-% IV SOLN
1000.0000 mg | Freq: Once | INTRAVENOUS | Status: AC
  Administered 2016-09-19: 1000 mg via INTRAVENOUS
  Filled 2016-09-19: qty 200

## 2016-09-19 MED ORDER — FENTANYL CITRATE (PF) 100 MCG/2ML IJ SOLN
25.0000 ug | Freq: Once | INTRAMUSCULAR | Status: AC
  Administered 2016-09-19: 25 ug via INTRAVENOUS
  Filled 2016-09-19: qty 2

## 2016-09-19 MED ORDER — CEFTAZIDIME 1 G IJ SOLR
1.0000 g | Freq: Once | INTRAMUSCULAR | Status: AC
  Administered 2016-09-19: 1 g via INTRAVENOUS
  Filled 2016-09-19: qty 1

## 2016-09-19 MED ORDER — ACETAMINOPHEN 325 MG PO TABS
ORAL_TABLET | ORAL | Status: AC
  Filled 2016-09-19: qty 2

## 2016-09-19 MED ORDER — SODIUM CHLORIDE 0.9 % IV BOLUS (SEPSIS)
500.0000 mL | Freq: Once | INTRAVENOUS | Status: AC
  Administered 2016-09-19: 500 mL via INTRAVENOUS

## 2016-09-19 MED ORDER — ACETAMINOPHEN 325 MG PO TABS
650.0000 mg | ORAL_TABLET | Freq: Once | ORAL | Status: AC | PRN
  Administered 2016-09-19: 650 mg via ORAL

## 2016-09-19 NOTE — ED Notes (Signed)
Patient taken to xray.

## 2016-09-19 NOTE — ED Notes (Signed)
Pt requesting to eat.  Advised his we needed urine sample and test to come back before he could have anything to eat.

## 2016-09-19 NOTE — ED Triage Notes (Addendum)
Pt reports last dialysis was yesterday, developed fever at end of session. Was told to come here for eval today due to still having fever/chills. Denies any urinary symptoms, n/v/d or cough. No acute distress noted at triage.

## 2016-09-19 NOTE — ED Provider Notes (Signed)
[facility] DEPT Provider Note   CSN: [id number] Arrival date & time: 09/19/16  1404     History   Chief Complaint Chief Complaint  Patient presents with  . Fever    HPI Jonathan Horton is a 55 y.o. male.  Pt reports to the ED today with fever after dialysis.  Nursing reports dialysis yesterday, but it was today.  He normally gets dialysis MWF, but due to the holiday, he had it today.  The pt said he did not have a fever when he went to dialysis, but he did when it finished.  He was initially told to go home and see how he felt.  He said his fever continued and he felt a little worse.  He called the dialysis unit and they told him to come here.  Pt denies any pain other than his left arm where he had a fistulagram done yesterday to evaluate his fistula created in September.  The pt has been receiving dialysis through a tunneled IJ catheter placed 06/08/16.  The pt denies any cough, runny nose, or other sx.      Past Medical History:  Diagnosis Date  . Chronic kidney disease   . Diabetes mellitus without complication ([place])   . Hypertension     Patient Active Problem List   Diagnosis Date Noted  . Anemia 06/10/2016  . ESRD on dialysis ([place]) 06/10/2016  . Anemia of chronic kidney failure 06/10/2016  . S/P dialysis catheter insertion ([place])   . Renal failure 06/07/2016  . Acute renal failure (ARF) ([place]) 06/07/2016  . Essential hypertension 06/07/2016  . Diabetes mellitus ([place]) 06/07/2016    Past Surgical History:  Procedure Laterality Date  . AV FISTULA PLACEMENT Left 06/08/2016   Procedure: ARTERIOVENOUS (AV) FISTULA CREATION;  Surgeon: Angelia Mould, MD;  Location: [facility];  Service: Vascular;  Laterality: Left;  . INSERTION OF DIALYSIS CATHETER Right 06/08/2016   Procedure: INSERTION OF DIALYSIS CATHETER;  Surgeon: Angelia Mould, MD;  Location: [facility];  Service: Vascular;  Laterality: Right;       Home Medications    Prior to Admission medications     Medication Sig Start Date End Date Taking? Authorizing Provider  amLODipine (NORVASC) 10 MG tablet Take 10 mg by mouth daily. 05/01/16   Historical Provider, MD  calcitRIOL (ROCALTROL) 0.5 MCG capsule Take 1 capsule (0.5 mcg total) by mouth every Monday, Wednesday, and Friday with hemodialysis. 06/11/16   Nishant Dhungel, MD  calcium acetate (PHOSLO) 667 MG capsule Take 1 capsule (667 mg total) by mouth 3 (three) times daily with meals. 06/10/16   Nishant Dhungel, MD  carvedilol (COREG) 25 MG tablet Take 25 mg by mouth 2 (two) times daily. 05/01/16   Historical Provider, MD  cloNIDine (CATAPRES) 0.1 MG tablet Take 0.1 mg by mouth 2 (two) times daily. 05/01/16   Historical Provider, MD  multivitamin (RENA-VIT) TABS tablet Take 1 tablet by mouth at bedtime. 06/10/16   Nishant Dhungel, MD  oxyCODONE-acetaminophen (PERCOCET/ROXICET) 5-325 MG tablet Take 1 tablet by mouth every 4 (four) hours as needed for severe pain. Patient not taking: Reported on 07/28/2016 06/10/16   Nishant Dhungel, MD  pantoprazole (PROTONIX) 40 MG tablet Take 40 mg by mouth daily. 05/01/16   Historical Provider, MD  traMADol Veatrice Bourbon) 50 MG tablet Take by mouth. 01/08/14   Historical Provider, MD    Family History History reviewed. No pertinent family history.  Social History Social History  Substance Use Topics  . Smoking status: Never  Smoker  . Smokeless tobacco: Never Used  . Alcohol use No     Allergies   Patient has no known allergies.   Review of Systems Review of Systems  Constitutional: Positive for fever.  All other systems reviewed and are negative.    Physical Exam Updated Vital Signs BP 142/75   Pulse 110   Temp 102.5 F (39.2 C) (Oral)   Resp 20   Ht 5\' 10"  (1.778 m)   Wt 175 lb (79.4 kg)   SpO2 96%   BMI 25.11 kg/m   Physical Exam  Constitutional: He is oriented to person, place, and time. He appears well-developed and well-nourished.  HENT:  Head: Normocephalic and atraumatic.  Right Ear:  External ear normal.  Left Ear: External ear normal.  Nose: Nose normal.  Mouth/Throat: Oropharynx is clear and moist.  Eyes: Conjunctivae and EOM are normal. Pupils are equal, round, and reactive to light.  Neck: Normal range of motion. Neck supple.  Cardiovascular: Regular rhythm, normal heart sounds and intact distal pulses.  Tachycardia present.   Pulmonary/Chest: Effort normal and breath sounds normal.  Abdominal: Soft. Bowel sounds are normal.  Musculoskeletal:       Arms: Neurological: He is alert and oriented to person, place, and time.  Skin: Skin is warm.  Psychiatric: He has a normal mood and affect. His behavior is normal. Judgment and thought content normal.  Nursing note and vitals reviewed.    ED Treatments / Results  Labs (all labs ordered are listed, but only abnormal results are displayed) Labs Reviewed  COMPREHENSIVE METABOLIC PANEL - Abnormal; Notable for the following:       Result Value   Sodium 130 (*)    Chloride 95 (*)    Glucose, Bld 323 (*)    Creatinine, Ser 5.49 (*)    Albumin 2.6 (*)    GFR calc non Af Amer 11 (*)    GFR calc Af Amer 12 (*)    All other components within normal limits  CBC WITH DIFFERENTIAL/PLATELET - Abnormal; Notable for the following:    RBC 2.65 (*)    Hemoglobin 8.1 (*)    HCT 24.4 (*)    RDW 16.0 (*)    All other components within normal limits  CULTURE, BLOOD (ROUTINE X 2)  CULTURE, BLOOD (ROUTINE X 2)  URINE CULTURE  PROTIME-INR  URINALYSIS, ROUTINE W REFLEX MICROSCOPIC  I-STAT CG4 LACTIC ACID, ED    EKG  EKG Interpretation  Date/Time:  Sunday September 19 2016 15:58:18 EST Ventricular Rate:  98 PR Interval:    QRS Duration: 84 QT Interval:  349 QTC Calculation: 446 R Axis:   55 Text Interpretation:  Sinus rhythm Probable anteroseptal infarct, old Minimal ST elevation, inferior leads No significant change since last tracing Confirmed by [facility] MD, Amyra Vantuyl ([id number]) on 09/19/2016 4:00:34 PM        Radiology Dg Chest 2 View  Result Date: 09/19/2016 CLINICAL DATA:  Fever, dialysis patient EXAM: CHEST  2 VIEW COMPARISON:  06/08/2016 FINDINGS: Right dialysis catheter remains in place, unchanged. Heart is borderline in size. Lungs are clear. No effusions or edema. No acute bony abnormality. IMPRESSION: No active cardiopulmonary disease. Electronically Signed   By: Rolm Baptise M.D.   On: 09/19/2016 15:47    Procedures Procedures (including critical care time)  Medications Ordered in ED Medications  acetaminophen (TYLENOL) 325 MG tablet (not administered)  vancomycin (VANCOCIN) IVPB 1000 mg/200 mL premix (1,000 mg Intravenous New Bag/Given 09/19/16 1555)  cefTAZidime (  FORTAZ) 1 g in dextrose 5 % 50 mL IVPB (not administered)  acetaminophen (TYLENOL) tablet 650 mg (650 mg Oral Given 09/19/16 1425)  sodium chloride 0.9 % bolus 500 mL (500 mLs Intravenous New Bag/Given 09/19/16 1523)  fentaNYL (SUBLIMAZE) injection 25 mcg (25 mcg Intravenous Given 09/19/16 1523)     Initial Impression / Assessment and Plan / ED Course  I have reviewed the triage vital signs and the nursing notes.  Pertinent labs & imaging results that were available during my care of the patient were reviewed by me and considered in my medical decision making (see chart for details).  Clinical Course    Pt given tylenol and IVFs.  He feels much better.  He was d/w Dr. Jonnie Finner who requested vancomycin and fortaz IV then ok for d/c.  The pt looks good and is ok with this plan.  Nephrology will follow up on cultures.  Pt knows to return if worse.  Fever possibly from his line that has been in since September.  Final Clinical Impressions(s) / ED Diagnoses   Final diagnoses:  ESRD on hemodialysis ([place])  Fever, unspecified fever cause    New Prescriptions New Prescriptions   No medications on file     Isla Pence, MD 09/19/16 1642

## 2016-09-20 LAB — BLOOD CULTURE ID PANEL (REFLEXED)
Acinetobacter baumannii: NOT DETECTED
CANDIDA TROPICALIS: NOT DETECTED
CARBAPENEM RESISTANCE: NOT DETECTED
Candida albicans: NOT DETECTED
Candida glabrata: NOT DETECTED
Candida krusei: NOT DETECTED
Candida parapsilosis: NOT DETECTED
ENTEROCOCCUS SPECIES: NOT DETECTED
Enterobacter cloacae complex: NOT DETECTED
Enterobacteriaceae species: NOT DETECTED
Escherichia coli: NOT DETECTED
HAEMOPHILUS INFLUENZAE: NOT DETECTED
Klebsiella oxytoca: NOT DETECTED
Klebsiella pneumoniae: NOT DETECTED
LISTERIA MONOCYTOGENES: NOT DETECTED
METHICILLIN RESISTANCE: NOT DETECTED
NEISSERIA MENINGITIDIS: NOT DETECTED
PROTEUS SPECIES: NOT DETECTED
Pseudomonas aeruginosa: NOT DETECTED
SERRATIA MARCESCENS: NOT DETECTED
STAPHYLOCOCCUS SPECIES: NOT DETECTED
STREPTOCOCCUS PYOGENES: NOT DETECTED
STREPTOCOCCUS SPECIES: DETECTED — AB
Staphylococcus aureus (BCID): NOT DETECTED
Streptococcus agalactiae: DETECTED — AB
Streptococcus pneumoniae: NOT DETECTED
Vancomycin resistance: NOT DETECTED

## 2016-09-20 LAB — URINE CULTURE: Culture: <10000 — AB

## 2016-09-22 DIAGNOSIS — Z452 Encounter for adjustment and management of vascular access device: Secondary | ICD-10-CM | POA: Diagnosis not present

## 2016-09-22 DIAGNOSIS — A401 Sepsis due to streptococcus, group B: Secondary | ICD-10-CM | POA: Diagnosis not present

## 2016-09-22 DIAGNOSIS — N186 End stage renal disease: Secondary | ICD-10-CM | POA: Diagnosis not present

## 2016-09-22 DIAGNOSIS — T8249XD Other complication of vascular dialysis catheter, subsequent encounter: Secondary | ICD-10-CM | POA: Diagnosis not present

## 2016-09-22 LAB — CULTURE, BLOOD (ROUTINE X 2)

## 2016-09-23 NOTE — Progress Notes (Signed)
ED Antimicrobial Stewardship Positive Culture Follow Up   Jonathan Horton is an 56 y.o. male who presented to [facility] on 09/19/2016 with a chief complaint of  Chief Complaint  Patient presents with  . Fever    Recent Results (from the past 720 hour(s))  Culture, blood (Routine x 2)     Status: Abnormal   Collection Time: 09/19/16  2:35 PM  Result Value Ref Range Status   Specimen Description BLOOD RIGHT ANTECUBITAL  Final   Special Requests BOTTLES DRAWN AEROBIC AND ANAEROBIC 10CC  Final   Culture  Setup Time   Final    GRAM POSITIVE COCCI IN CHAINS IN BOTH AEROBIC AND ANAEROBIC BOTTLES CRITICAL RESULT CALLED TO, READ BACK BY AND VERIFIED WITH: D.HINSON,RN 1443 09/20/16 G.MCADOO    Culture GROUP B STREP(S.AGALACTIAE)ISOLATED (A)  Final   Report Status 09/22/2016 FINAL  Final   Organism ID, Bacteria GROUP B STREP(S.AGALACTIAE)ISOLATED  Final      Susceptibility   Group b strep(s.agalactiae)isolated - MIC*    CLINDAMYCIN >=1 RESISTANT Resistant     AMPICILLIN <=0.25 SENSITIVE Sensitive     ERYTHROMYCIN >=8 RESISTANT Resistant     VANCOMYCIN 0.5 SENSITIVE Sensitive     CEFTRIAXONE <=0.12 SENSITIVE Sensitive     LEVOFLOXACIN 1 SENSITIVE Sensitive     * GROUP B STREP(S.AGALACTIAE)ISOLATED  Blood Culture ID Panel (Reflexed)     Status: Abnormal   Collection Time: 09/19/16  2:35 PM  Result Value Ref Range Status   Enterococcus species NOT DETECTED NOT DETECTED Final   Vancomycin resistance NOT DETECTED NOT DETECTED Final   Listeria monocytogenes NOT DETECTED NOT DETECTED Final   Staphylococcus species NOT DETECTED NOT DETECTED Final   Staphylococcus aureus NOT DETECTED NOT DETECTED Final   Methicillin resistance NOT DETECTED NOT DETECTED Final   Streptococcus species DETECTED (A) NOT DETECTED Final    Comment: CRITICAL RESULT CALLED TO, READ BACK BY AND VERIFIED WITH: D.HINSON,RN AT 1540 ON 09/20/16 G.MCADOO    Streptococcus agalactiae DETECTED (A) NOT DETECTED Final    Comment:  CRITICAL RESULT CALLED TO, READ BACK BY AND VERIFIED WITH: D.HINSON,RN AT 0867 ON 09/20/16 G.MCADOO    Streptococcus pneumoniae NOT DETECTED NOT DETECTED Final   Streptococcus pyogenes NOT DETECTED NOT DETECTED Final   Acinetobacter baumannii NOT DETECTED NOT DETECTED Final   Enterobacteriaceae species NOT DETECTED NOT DETECTED Final   Enterobacter cloacae complex NOT DETECTED NOT DETECTED Final   Escherichia coli NOT DETECTED NOT DETECTED Final   Klebsiella oxytoca NOT DETECTED NOT DETECTED Final   Klebsiella pneumoniae NOT DETECTED NOT DETECTED Final   Proteus species NOT DETECTED NOT DETECTED Final   Serratia marcescens NOT DETECTED NOT DETECTED Final   Carbapenem resistance NOT DETECTED NOT DETECTED Final   Haemophilus influenzae NOT DETECTED NOT DETECTED Final   Neisseria meningitidis NOT DETECTED NOT DETECTED Final   Pseudomonas aeruginosa NOT DETECTED NOT DETECTED Final   Candida albicans NOT DETECTED NOT DETECTED Final   Candida glabrata NOT DETECTED NOT DETECTED Final   Candida krusei NOT DETECTED NOT DETECTED Final   Candida parapsilosis NOT DETECTED NOT DETECTED Final   Candida tropicalis NOT DETECTED NOT DETECTED Final  Culture, blood (Routine x 2)     Status: Abnormal   Collection Time: 09/19/16  2:56 PM  Result Value Ref Range Status   Specimen Description BLOOD RIGHT ANTECUBITAL  Final   Special Requests BOTTLES DRAWN AEROBIC AND ANAEROBIC 5 CC  Final   Culture  Setup Time   Final  GRAM POSITIVE COCCI IN CHAINS IN BOTH AEROBIC AND ANAEROBIC BOTTLES CRITICAL VALUE NOTED.  VALUE IS CONSISTENT WITH PREVIOUSLY REPORTED AND CALLED VALUE.    Culture (A)  Final    GROUP B STREP(S.AGALACTIAE)ISOLATED SUSCEPTIBILITIES PERFORMED ON PREVIOUS CULTURE WITHIN THE LAST 5 DAYS.    Report Status 09/22/2016 FINAL  Final  Urine culture     Status: Abnormal   Collection Time: 09/19/16  4:35 PM  Result Value Ref Range Status   Specimen Description URINE, CLEAN CATCH  Final    Special Requests NONE  Final   Culture <10,000 COLONIES/mL INSIGNIFICANT GROWTH (A)  Final   Report Status 09/20/2016 FINAL  Final   Results called to [place] at [facility].  They were already aware of them and have adjusted treatment.  HD catheter is to be removed.   Candie Mile 09/23/2016, 8:53 AM Infectious Diseases Pharmacist Phone# [phone]

## 2016-09-24 DIAGNOSIS — N186 End stage renal disease: Secondary | ICD-10-CM | POA: Diagnosis not present

## 2016-09-24 DIAGNOSIS — A401 Sepsis due to streptococcus, group B: Secondary | ICD-10-CM | POA: Diagnosis not present

## 2016-09-24 DIAGNOSIS — T8249XD Other complication of vascular dialysis catheter, subsequent encounter: Secondary | ICD-10-CM | POA: Diagnosis not present

## 2016-09-27 DIAGNOSIS — N186 End stage renal disease: Secondary | ICD-10-CM | POA: Diagnosis not present

## 2016-09-27 DIAGNOSIS — N2581 Secondary hyperparathyroidism of renal origin: Secondary | ICD-10-CM | POA: Diagnosis not present

## 2016-09-27 DIAGNOSIS — A401 Sepsis due to streptococcus, group B: Secondary | ICD-10-CM | POA: Diagnosis not present

## 2016-09-29 DIAGNOSIS — N186 End stage renal disease: Secondary | ICD-10-CM | POA: Diagnosis not present

## 2016-09-29 DIAGNOSIS — A401 Sepsis due to streptococcus, group B: Secondary | ICD-10-CM | POA: Diagnosis not present

## 2016-09-29 DIAGNOSIS — N2581 Secondary hyperparathyroidism of renal origin: Secondary | ICD-10-CM | POA: Diagnosis not present

## 2016-10-01 DIAGNOSIS — N186 End stage renal disease: Secondary | ICD-10-CM | POA: Diagnosis not present

## 2016-10-01 DIAGNOSIS — N2581 Secondary hyperparathyroidism of renal origin: Secondary | ICD-10-CM | POA: Diagnosis not present

## 2016-10-01 DIAGNOSIS — A401 Sepsis due to streptococcus, group B: Secondary | ICD-10-CM | POA: Diagnosis not present

## 2016-10-04 DIAGNOSIS — N2581 Secondary hyperparathyroidism of renal origin: Secondary | ICD-10-CM | POA: Diagnosis not present

## 2016-10-04 DIAGNOSIS — A401 Sepsis due to streptococcus, group B: Secondary | ICD-10-CM | POA: Diagnosis not present

## 2016-10-04 DIAGNOSIS — N186 End stage renal disease: Secondary | ICD-10-CM | POA: Diagnosis not present

## 2016-10-05 ENCOUNTER — Ambulatory Visit: Payer: BLUE CROSS/BLUE SHIELD | Admitting: Physician Assistant

## 2016-10-06 DIAGNOSIS — A401 Sepsis due to streptococcus, group B: Secondary | ICD-10-CM | POA: Diagnosis not present

## 2016-10-06 DIAGNOSIS — N186 End stage renal disease: Secondary | ICD-10-CM | POA: Diagnosis not present

## 2016-10-06 DIAGNOSIS — N2581 Secondary hyperparathyroidism of renal origin: Secondary | ICD-10-CM | POA: Diagnosis not present

## 2016-10-08 DIAGNOSIS — A401 Sepsis due to streptococcus, group B: Secondary | ICD-10-CM | POA: Diagnosis not present

## 2016-10-08 DIAGNOSIS — N186 End stage renal disease: Secondary | ICD-10-CM | POA: Diagnosis not present

## 2016-10-08 DIAGNOSIS — N2581 Secondary hyperparathyroidism of renal origin: Secondary | ICD-10-CM | POA: Diagnosis not present

## 2016-10-11 DIAGNOSIS — N186 End stage renal disease: Secondary | ICD-10-CM | POA: Diagnosis not present

## 2016-10-11 DIAGNOSIS — N2581 Secondary hyperparathyroidism of renal origin: Secondary | ICD-10-CM | POA: Diagnosis not present

## 2016-10-13 DIAGNOSIS — N2581 Secondary hyperparathyroidism of renal origin: Secondary | ICD-10-CM | POA: Diagnosis not present

## 2016-10-13 DIAGNOSIS — N186 End stage renal disease: Secondary | ICD-10-CM | POA: Diagnosis not present

## 2016-10-15 DIAGNOSIS — N186 End stage renal disease: Secondary | ICD-10-CM | POA: Diagnosis not present

## 2016-10-15 DIAGNOSIS — N2581 Secondary hyperparathyroidism of renal origin: Secondary | ICD-10-CM | POA: Diagnosis not present

## 2016-10-18 DIAGNOSIS — N2581 Secondary hyperparathyroidism of renal origin: Secondary | ICD-10-CM | POA: Diagnosis not present

## 2016-10-18 DIAGNOSIS — N186 End stage renal disease: Secondary | ICD-10-CM | POA: Diagnosis not present

## 2016-10-20 DIAGNOSIS — N2581 Secondary hyperparathyroidism of renal origin: Secondary | ICD-10-CM | POA: Diagnosis not present

## 2016-10-20 DIAGNOSIS — N186 End stage renal disease: Secondary | ICD-10-CM | POA: Diagnosis not present

## 2016-10-20 DIAGNOSIS — Z992 Dependence on renal dialysis: Secondary | ICD-10-CM | POA: Diagnosis not present

## 2016-10-20 DIAGNOSIS — I129 Hypertensive chronic kidney disease with stage 1 through stage 4 chronic kidney disease, or unspecified chronic kidney disease: Secondary | ICD-10-CM | POA: Diagnosis not present

## 2016-10-22 DIAGNOSIS — N2581 Secondary hyperparathyroidism of renal origin: Secondary | ICD-10-CM | POA: Diagnosis not present

## 2016-10-22 DIAGNOSIS — N186 End stage renal disease: Secondary | ICD-10-CM | POA: Diagnosis not present

## 2016-10-25 DIAGNOSIS — N186 End stage renal disease: Secondary | ICD-10-CM | POA: Diagnosis not present

## 2016-10-25 DIAGNOSIS — N2581 Secondary hyperparathyroidism of renal origin: Secondary | ICD-10-CM | POA: Diagnosis not present

## 2016-10-27 DIAGNOSIS — N186 End stage renal disease: Secondary | ICD-10-CM | POA: Diagnosis not present

## 2016-10-27 DIAGNOSIS — N2581 Secondary hyperparathyroidism of renal origin: Secondary | ICD-10-CM | POA: Diagnosis not present

## 2016-10-29 DIAGNOSIS — N186 End stage renal disease: Secondary | ICD-10-CM | POA: Diagnosis not present

## 2016-10-29 DIAGNOSIS — N2581 Secondary hyperparathyroidism of renal origin: Secondary | ICD-10-CM | POA: Diagnosis not present

## 2016-11-01 DIAGNOSIS — N2581 Secondary hyperparathyroidism of renal origin: Secondary | ICD-10-CM | POA: Diagnosis not present

## 2016-11-01 DIAGNOSIS — I10 Essential (primary) hypertension: Secondary | ICD-10-CM | POA: Diagnosis not present

## 2016-11-01 DIAGNOSIS — N186 End stage renal disease: Secondary | ICD-10-CM | POA: Diagnosis not present

## 2016-11-02 ENCOUNTER — Encounter: Payer: Self-pay | Admitting: Physician Assistant

## 2016-11-02 ENCOUNTER — Encounter (INDEPENDENT_AMBULATORY_CARE_PROVIDER_SITE_OTHER): Payer: Self-pay

## 2016-11-02 ENCOUNTER — Ambulatory Visit (INDEPENDENT_AMBULATORY_CARE_PROVIDER_SITE_OTHER): Payer: BLUE CROSS/BLUE SHIELD | Admitting: Physician Assistant

## 2016-11-02 VITALS — BP 110/60 | HR 83 | Ht 70.0 in | Wt 174.0 lb

## 2016-11-02 DIAGNOSIS — D649 Anemia, unspecified: Secondary | ICD-10-CM

## 2016-11-02 DIAGNOSIS — R195 Other fecal abnormalities: Secondary | ICD-10-CM | POA: Diagnosis not present

## 2016-11-02 MED ORDER — NA SULFATE-K SULFATE-MG SULF 17.5-3.13-1.6 GM/177ML PO SOLN: 1.0000 | ORAL | 0 refills | Status: DC

## 2016-11-02 NOTE — Progress Notes (Signed)
Agree with assessment and plan as outlined.  

## 2016-11-02 NOTE — Patient Instructions (Signed)

## 2016-11-02 NOTE — Progress Notes (Addendum)
Chief Complaint: Hemoccult positive stool  HPI:  Jonathan Horton is a very pleasant 56 year old African-American male with a past medical history of chronic kidney disease on dialysis, diabetes and hypertension, who was referred to me by Ernest Haber, PA-C for a complaint of Hemoccult positive stool.    Patient's last recorded hemoglobin was 10/20/16 at 13.8, iron normal at 73. She did have positive Hemoccult cards per referral report. Per review of records patient was seen in the ED on 09/19/16 with a hemoglobin of 8.1.   The patient tells me today that he was "hospitalized at the end of January" and found to be anemic (though I cannot find records of this-just an ER visit). He is a poor historian and hard to follow. He describes that they "weren't sure why" while he was in the hospital but he did receive a "blood transfusion" (which I also cannot find report of) and was told recently that his hemoglobin was back to normal by his PCP. He was requested to fill out some Hemoccult cards which he took home with him and these returned positive. Patient tells me his last colonoscopy was at least 3 years ago "across from [facility]". He tells me there was a finding of "2 small polyps that were destroyed". Patient denies any GI complaints today. He tells me he has been having normal bowel movements and has not noticed any melena or bright red blood in his stools.   Patient denies fever, chills, change in bowel habits, weight loss, fatigue and anorexia, nausea, vomiting, heartburn or reflux.  Past Medical History:  Diagnosis Date  . Chronic kidney disease   . Diabetes mellitus without complication ([place])   . Hypertension     Past Surgical History:  Procedure Laterality Date  . AV FISTULA PLACEMENT Left 06/08/2016   Procedure: ARTERIOVENOUS (AV) FISTULA CREATION;  Surgeon: Angelia Mould, MD;  Location: [facility];  Service: Vascular;  Laterality: Left;  . INSERTION OF DIALYSIS CATHETER Right  06/08/2016   Procedure: INSERTION OF DIALYSIS CATHETER;  Surgeon: Angelia Mould, MD;  Location: [place];  Service: Vascular;  Laterality: Right;    Current Outpatient Prescriptions  Medication Sig Dispense Refill  . amLODipine (NORVASC) 10 MG tablet Take 10 mg by mouth daily.    . calcitRIOL (ROCALTROL) 0.5 MCG capsule Take 1 capsule (0.5 mcg total) by mouth every Monday, Wednesday, and Friday with hemodialysis. 30 capsule 0  . calcium acetate (PHOSLO) 667 MG capsule Take 1 capsule (667 mg total) by mouth 3 (three) times daily with meals. 90 capsule 0  . carvedilol (COREG) 25 MG tablet Take 25 mg by mouth 2 (two) times daily.    . cloNIDine (CATAPRES) 0.1 MG tablet Take 0.1 mg by mouth 2 (two) times daily.    . multivitamin (RENA-VIT) TABS tablet Take 1 tablet by mouth at bedtime. 30 tablet 0  . pantoprazole (PROTONIX) 40 MG tablet Take 40 mg by mouth daily.    . traMADol (ULTRAM) 50 MG tablet Take by mouth.     No current facility-administered medications for this visit.     Allergies as of 11/02/2016  . (No Known Allergies)    Family History  Problem Relation Age of Onset  . Family history unknown: Yes    Social History   Social History  . Marital status: Married    Spouse name: N/A  . Number of children: N/A  . Years of education: N/A   Occupational History  . Not on  file.   Social History Main Topics  . Smoking status: Never Smoker  . Smokeless tobacco: Never Used  . Alcohol use No  . Drug use: No  . Sexual activity: Not on file   Other Topics Concern  . Not on file   Social History Narrative  . No narrative on file    Review of Systems:    Constitutional: No weight loss, fever or chills Skin: No rash  Cardiovascular: No chest pain Respiratory: No SOB  Gastrointestinal: See HPI and otherwise negative Genitourinary: No dysuria or change in urinary frequency Neurological: No headache Musculoskeletal: No new muscle or joint pain Hematologic: No  bleeding or bruising Psychiatric: No history of depression or anxiety   Physical Exam:  Vital signs: Ht 5\' 10"  (1.778 m)   Wt 174 lb (78.9 kg)   BMI 24.97 kg/m   Constitutional:   Pleasant African American male appears to be in NAD, Well developed, Well nourished, alert and cooperative Head:  Normocephalic and atraumatic. Eyes:   PEERL, EOMI. No icterus. Conjunctiva pink. Ears:  Normal auditory acuity. Neck:  Supple Throat: Oral cavity and pharynx without inflammation, swelling or lesion.  Respiratory: Respirations even and unlabored. Lungs clear to auscultation bilaterally.   No wheezes, crackles, or rhonchi.  Cardiovascular: Normal S1, S2. No MRG. Regular rate and rhythm. No peripheral edema, cyanosis or pallor. AV Fistula Gastrointestinal:  Soft, nondistended, nontender. No rebound or guarding. Normal bowel sounds. No appreciable masses or hepatomegaly. Rectal:  Not performed.  Msk:  Symmetrical without gross deformities. Without edema, no deformity or joint abnormality.  Neurologic:  Alert and  oriented x4;  grossly normal neurologically.  Skin:   Dry and intact without significant lesions or rashes. Psychiatric:  Demonstrates good judgement and reason without abnormal affect or behaviors.  RELEVANT LABS AND IMAGING: CBC    Component Value Date/Time   WBC 9.0 09/19/2016 1435   RBC 2.65 (L) 09/19/2016 1435   HGB 8.1 (L) 09/19/2016 1435   HCT 24.4 (L) 09/19/2016 1435   PLT 359 09/19/2016 1435   MCV 92.1 09/19/2016 1435   MCH 30.6 09/19/2016 1435   MCHC 33.2 09/19/2016 1435   RDW 16.0 (H) 09/19/2016 1435   LYMPHSABS 0.7 09/19/2016 1435   MONOABS 0.6 09/19/2016 1435   EOSABS 0.2 09/19/2016 1435   BASOSABS 0.0 09/19/2016 1435    CMP     Component Value Date/Time   NA 130 (L) 09/19/2016 1435   K 3.8 09/19/2016 1435   CL 95 (L) 09/19/2016 1435   CO2 25 09/19/2016 1435   GLUCOSE 323 (H) 09/19/2016 1435   BUN 18 09/19/2016 1435   CREATININE 5.49 (H) 09/19/2016 1435    CALCIUM 10.3 09/19/2016 1435   PROT 7.3 09/19/2016 1435   ALBUMIN 2.6 (L) 09/19/2016 1435   AST 32 09/19/2016 1435   ALT 17 09/19/2016 1435   ALKPHOS 87 09/19/2016 1435   BILITOT 1.0 09/19/2016 1435   GFRNONAA 11 (L) 09/19/2016 1435   GFRAA 12 (L) 09/19/2016 1435    Assessment: 1. Hemoccult-positive stool: Found by patient's PCP, he was seen in the ER with anemia and a hemoglobin around 8 at the endo of December, most recently at the end of January hgb was back to normal around 13.8, last colon 2-3 years ago per patient, we do not have records but the patient describes a finding of 2 polyps; consider GI bleed versus anemia of chronic disease versus other 2. Anemia: pt tells me at the end of December-per  pt he received blood transfusions? Etiology? I do not see workup for this, though I do see pt was found to be anemic in September 2017 with transfusions x2 uprbcs-thought due to chronic kidney dz at that time-again pt poor historian  Plan: 1. At this time recommend patient schedule colonoscopy for further evaluation of Hemoccult positive stools. This was scheduled with Dr. Havery Moros in the [facility]. We did discuss risks, benefits, limitations alternatives the patient agrees to proceed. 2. Patient to follow in clinic per Dr. Doyne Keel recommendations after time of procedure.  Ellouise Newer, PA-C [facility] 11/02/2016, 9:50 AM  Cc: [name], MD    Addendum: 11/03/16-Receive records from [facility]. Patient had colonoscopy 01/09/14 with findings of one 5 mm polyp in the mid descending colon, one 5 mm polyp in the transverse colon, one 6 mm polyp in the rectum and diverticulosis in the sigmoid and descending colon. Pathology revealed a tubular adenoma and hyperplastic polyp in the ascending and transverse colon with no high-grade dysplasia and findings consistent with mucosal prolapse in the rectum.

## 2016-11-03 DIAGNOSIS — N186 End stage renal disease: Secondary | ICD-10-CM | POA: Diagnosis not present

## 2016-11-03 DIAGNOSIS — N2581 Secondary hyperparathyroidism of renal origin: Secondary | ICD-10-CM | POA: Diagnosis not present

## 2016-11-03 DIAGNOSIS — I10 Essential (primary) hypertension: Secondary | ICD-10-CM | POA: Diagnosis not present

## 2016-11-05 DIAGNOSIS — I10 Essential (primary) hypertension: Secondary | ICD-10-CM | POA: Diagnosis not present

## 2016-11-05 DIAGNOSIS — N186 End stage renal disease: Secondary | ICD-10-CM | POA: Diagnosis not present

## 2016-11-05 DIAGNOSIS — N2581 Secondary hyperparathyroidism of renal origin: Secondary | ICD-10-CM | POA: Diagnosis not present

## 2016-11-08 DIAGNOSIS — N186 End stage renal disease: Secondary | ICD-10-CM | POA: Diagnosis not present

## 2016-11-08 DIAGNOSIS — N2581 Secondary hyperparathyroidism of renal origin: Secondary | ICD-10-CM | POA: Diagnosis not present

## 2016-11-10 DIAGNOSIS — N186 End stage renal disease: Secondary | ICD-10-CM | POA: Diagnosis not present

## 2016-11-10 DIAGNOSIS — N2581 Secondary hyperparathyroidism of renal origin: Secondary | ICD-10-CM | POA: Diagnosis not present

## 2016-11-12 DIAGNOSIS — N2581 Secondary hyperparathyroidism of renal origin: Secondary | ICD-10-CM | POA: Diagnosis not present

## 2016-11-12 DIAGNOSIS — N186 End stage renal disease: Secondary | ICD-10-CM | POA: Diagnosis not present

## 2016-11-15 DIAGNOSIS — N186 End stage renal disease: Secondary | ICD-10-CM | POA: Diagnosis not present

## 2016-11-15 DIAGNOSIS — N2581 Secondary hyperparathyroidism of renal origin: Secondary | ICD-10-CM | POA: Diagnosis not present

## 2016-11-17 DIAGNOSIS — N186 End stage renal disease: Secondary | ICD-10-CM | POA: Diagnosis not present

## 2016-11-17 DIAGNOSIS — Z992 Dependence on renal dialysis: Secondary | ICD-10-CM | POA: Diagnosis not present

## 2016-11-17 DIAGNOSIS — N2581 Secondary hyperparathyroidism of renal origin: Secondary | ICD-10-CM | POA: Diagnosis not present

## 2016-11-17 DIAGNOSIS — I129 Hypertensive chronic kidney disease with stage 1 through stage 4 chronic kidney disease, or unspecified chronic kidney disease: Secondary | ICD-10-CM | POA: Diagnosis not present

## 2016-11-19 DIAGNOSIS — N2581 Secondary hyperparathyroidism of renal origin: Secondary | ICD-10-CM | POA: Diagnosis not present

## 2016-11-19 DIAGNOSIS — N186 End stage renal disease: Secondary | ICD-10-CM | POA: Diagnosis not present

## 2016-11-22 DIAGNOSIS — N186 End stage renal disease: Secondary | ICD-10-CM | POA: Diagnosis not present

## 2016-11-22 DIAGNOSIS — N2581 Secondary hyperparathyroidism of renal origin: Secondary | ICD-10-CM | POA: Diagnosis not present

## 2016-11-24 DIAGNOSIS — N2581 Secondary hyperparathyroidism of renal origin: Secondary | ICD-10-CM | POA: Diagnosis not present

## 2016-11-24 DIAGNOSIS — N186 End stage renal disease: Secondary | ICD-10-CM | POA: Diagnosis not present

## 2016-11-26 DIAGNOSIS — N2581 Secondary hyperparathyroidism of renal origin: Secondary | ICD-10-CM | POA: Diagnosis not present

## 2016-11-26 DIAGNOSIS — N186 End stage renal disease: Secondary | ICD-10-CM | POA: Diagnosis not present

## 2016-11-29 DIAGNOSIS — N186 End stage renal disease: Secondary | ICD-10-CM | POA: Diagnosis not present

## 2016-11-29 DIAGNOSIS — N2581 Secondary hyperparathyroidism of renal origin: Secondary | ICD-10-CM | POA: Diagnosis not present

## 2016-12-01 DIAGNOSIS — N2581 Secondary hyperparathyroidism of renal origin: Secondary | ICD-10-CM | POA: Diagnosis not present

## 2016-12-01 DIAGNOSIS — N186 End stage renal disease: Secondary | ICD-10-CM | POA: Diagnosis not present

## 2016-12-06 DIAGNOSIS — N186 End stage renal disease: Secondary | ICD-10-CM | POA: Diagnosis not present

## 2016-12-06 DIAGNOSIS — N2581 Secondary hyperparathyroidism of renal origin: Secondary | ICD-10-CM | POA: Diagnosis not present

## 2016-12-08 DIAGNOSIS — N186 End stage renal disease: Secondary | ICD-10-CM | POA: Diagnosis not present

## 2016-12-08 DIAGNOSIS — N2581 Secondary hyperparathyroidism of renal origin: Secondary | ICD-10-CM | POA: Diagnosis not present

## 2016-12-10 DIAGNOSIS — N2581 Secondary hyperparathyroidism of renal origin: Secondary | ICD-10-CM | POA: Diagnosis not present

## 2016-12-10 DIAGNOSIS — N186 End stage renal disease: Secondary | ICD-10-CM | POA: Diagnosis not present

## 2016-12-13 DIAGNOSIS — N186 End stage renal disease: Secondary | ICD-10-CM | POA: Diagnosis not present

## 2016-12-13 DIAGNOSIS — N2581 Secondary hyperparathyroidism of renal origin: Secondary | ICD-10-CM | POA: Diagnosis not present

## 2016-12-14 ENCOUNTER — Encounter: Payer: Self-pay | Admitting: Gastroenterology

## 2016-12-14 DIAGNOSIS — N186 End stage renal disease: Secondary | ICD-10-CM | POA: Diagnosis not present

## 2016-12-14 DIAGNOSIS — Z992 Dependence on renal dialysis: Secondary | ICD-10-CM | POA: Diagnosis not present

## 2016-12-14 DIAGNOSIS — T82858A Stenosis of vascular prosthetic devices, implants and grafts, initial encounter: Secondary | ICD-10-CM | POA: Diagnosis not present

## 2016-12-14 DIAGNOSIS — I871 Compression of vein: Secondary | ICD-10-CM | POA: Diagnosis not present

## 2016-12-15 DIAGNOSIS — N186 End stage renal disease: Secondary | ICD-10-CM | POA: Diagnosis not present

## 2016-12-15 DIAGNOSIS — N2581 Secondary hyperparathyroidism of renal origin: Secondary | ICD-10-CM | POA: Diagnosis not present

## 2016-12-17 DIAGNOSIS — N186 End stage renal disease: Secondary | ICD-10-CM | POA: Diagnosis not present

## 2016-12-17 DIAGNOSIS — N2581 Secondary hyperparathyroidism of renal origin: Secondary | ICD-10-CM | POA: Diagnosis not present

## 2016-12-18 DIAGNOSIS — Z992 Dependence on renal dialysis: Secondary | ICD-10-CM | POA: Diagnosis not present

## 2016-12-18 DIAGNOSIS — I129 Hypertensive chronic kidney disease with stage 1 through stage 4 chronic kidney disease, or unspecified chronic kidney disease: Secondary | ICD-10-CM | POA: Diagnosis not present

## 2016-12-18 DIAGNOSIS — N186 End stage renal disease: Secondary | ICD-10-CM | POA: Diagnosis not present

## 2016-12-20 DIAGNOSIS — N186 End stage renal disease: Secondary | ICD-10-CM | POA: Diagnosis not present

## 2016-12-20 DIAGNOSIS — N2581 Secondary hyperparathyroidism of renal origin: Secondary | ICD-10-CM | POA: Diagnosis not present

## 2016-12-22 DIAGNOSIS — N186 End stage renal disease: Secondary | ICD-10-CM | POA: Diagnosis not present

## 2016-12-22 DIAGNOSIS — N2581 Secondary hyperparathyroidism of renal origin: Secondary | ICD-10-CM | POA: Diagnosis not present

## 2016-12-23 ENCOUNTER — Encounter: Payer: Self-pay | Admitting: Gastroenterology

## 2016-12-23 ENCOUNTER — Ambulatory Visit (AMBULATORY_SURGERY_CENTER): Payer: BLUE CROSS/BLUE SHIELD | Admitting: Gastroenterology

## 2016-12-23 VITALS — BP 115/68 | HR 75 | Temp 98.5°F | Resp 10 | Ht 70.0 in | Wt 174.0 lb

## 2016-12-23 DIAGNOSIS — Z860100 Personal history of colon polyps, unspecified: Secondary | ICD-10-CM

## 2016-12-23 DIAGNOSIS — R195 Other fecal abnormalities: Secondary | ICD-10-CM

## 2016-12-23 DIAGNOSIS — Z1211 Encounter for screening for malignant neoplasm of colon: Secondary | ICD-10-CM | POA: Insufficient documentation

## 2016-12-23 DIAGNOSIS — D125 Benign neoplasm of sigmoid colon: Secondary | ICD-10-CM | POA: Diagnosis not present

## 2016-12-23 DIAGNOSIS — D123 Benign neoplasm of transverse colon: Secondary | ICD-10-CM

## 2016-12-23 DIAGNOSIS — D122 Benign neoplasm of ascending colon: Secondary | ICD-10-CM

## 2016-12-23 MED ORDER — SODIUM CHLORIDE 0.9 % IV SOLN: 500.0000 mL | INTRAVENOUS | Status: DC

## 2016-12-23 NOTE — Patient Instructions (Signed)
YOU HAD AN ENDOSCOPIC PROCEDURE TODAY AT [facility]:   Refer to the procedure report that was given to you for any specific questions about what was found during the examination.  If the procedure report does not answer your questions, please call your gastroenterologist to clarify.  If you requested that your care partner not be given the details of your procedure findings, then the procedure report has been included in a sealed envelope for you to review at your convenience later.  YOU SHOULD EXPECT: Some feelings of bloating in the abdomen. Passage of more gas than usual.  Walking can help get rid of the air that was put into your GI tract during the procedure and reduce the bloating. If you had a lower endoscopy (such as a colonoscopy or flexible sigmoidoscopy) you may notice spotting of blood in your stool or on the toilet paper. If you underwent a bowel prep for your procedure, you may not have a normal bowel movement for a few days.  Please Note:  You might notice some irritation and congestion in your nose or some drainage.  This is from the oxygen used during your procedure.  There is no need for concern and it should clear up in a day or so.  SYMPTOMS TO REPORT IMMEDIATELY:   Following lower endoscopy (colonoscopy or flexible sigmoidoscopy):  Excessive amounts of blood in the stool  Significant tenderness or worsening of abdominal pains  Swelling of the abdomen that is new, acute  Fever of 100F or higher    For urgent or emergent issues, a gastroenterologist can be reached at any hour by calling [phone].   DIET:  We do recommend a small meal at first, but then you may proceed to your regular diet.  Drink plenty of fluids but you should avoid alcoholic beverages for 24 hours.  ACTIVITY:  You should plan to take it easy for the rest of today and you should NOT DRIVE or use heavy machinery until tomorrow (because of the sedation medicines used during the test).     FOLLOW UP: Our staff will call the number listed on your records the next business day following your procedure to check on you and address any questions or concerns that you may have regarding the information given to you following your procedure. If we do not reach you, we will leave a message.  However, if you are feeling well and you are not experiencing any problems, there is no need to return our call.  We will assume that you have returned to your regular daily activities without incident.  If any biopsies were taken you will be contacted by phone or by letter within the next 1-3 weeks.  Please call us at [phone] if you have not heard about the biopsies in 3 weeks.    SIGNATURES/CONFIDENTIALITY: You and/or your care partner have signed paperwork which will be entered into your electronic medical record.  These signatures attest to the fact that that the information above on your After Visit Summary has been reviewed and is understood.  Full responsibility of the confidentiality of this discharge information lies with you and/or your care-partner.   No ibuprofen,naproxen,or non-steroidal anti-inflammatory drugs for 2 weeks,resume remainder of medications.Information given on polyps,diverticulosis and hemorrhoids.

## 2016-12-23 NOTE — Progress Notes (Signed)
Clarified with doctor about cbc and hgb he stated that it can be repeated after they receive path results.1539.pt. Had not expelled air,abdomen soft,non tender to touch,deny pain,made Dr. Havery Moros and he stated that he went back in to with draw air if he is not in any pain he can leave and go home.1553. Pt. Placed in consultation room to wake up a little more.

## 2016-12-23 NOTE — Progress Notes (Signed)
Called to room to assist during endoscopic procedure.  Patient ID and intended procedure confirmed with present staff. Received instructions for my participation in the procedure from the performing physician.  

## 2016-12-23 NOTE — Op Note (Signed)
[facility] Patient Name: Jonathan Horton Procedure Date: 12/23/2016 2:24 PM MRN: [id number] Endoscopist: Remo Lipps P. Amalee Olsen MD, MD Age: 56 Referring MD:  Date of Birth: 09/18/1961 Gender: Male Account #: [id number] Procedure:                Colonoscopy Indications:              Occult positive stools, anemia (non-iron                            deficient), history of colon polyps Medicines:                Monitored Anesthesia Care Procedure:                Pre-Anesthesia Assessment:                           - Prior to the procedure, a History and Physical                            was performed, and patient medications and                            allergies were reviewed. The patient's tolerance of                            previous anesthesia was also reviewed. The risks                            and benefits of the procedure and the sedation                            options and risks were discussed with the patient.                            All questions were answered, and informed consent                            was obtained. Prior Anticoagulants: The patient has                            taken no previous anticoagulant or antiplatelet                            agents. ASA Grade Assessment: III - A patient with                            severe systemic disease. After reviewing the risks                            and benefits, the patient was deemed in                            satisfactory condition to undergo the procedure.  After obtaining informed consent, the colonoscope                            was passed under direct vision. Throughout the                            procedure, the patient's blood pressure, pulse, and                            oxygen saturations were monitored continuously. The                            Colonoscope was introduced through the anus and                            advanced to the the cecum,  identified by                            appendiceal orifice and ileocecal valve. The                            colonoscopy was technically difficult and complex                            due to significant looping and a tortuous colon.                            The patient tolerated the procedure well. The                            quality of the bowel preparation was adequate. The                            ileocecal valve, appendiceal orifice, and rectum                            were photographed. Scope In: 2:35:26 PM Scope Out: 3:04:45 PM Scope Withdrawal Time: 0 hours 24 minutes 54 seconds  Total Procedure Duration: 0 hours 29 minutes 19 seconds  Findings:                 The perianal and digital rectal examinations were                            normal.                           Two sessile polyps were found in the ascending                            colon. The polyps were diminutive in size. These                            polyps were removed with a cold snare. Resection  and retrieval were complete.                           Two sessile polyps were found in the transverse                            colon. The polyps were 4 to 5 mm in size. These                            polyps were removed with a cold snare. Resection                            and retrieval were complete.                           A 4 mm polyp was found in the sigmoid colon. The                            polyp was sessile. The polyp was removed with a                            cold snare. Resection and retrieval were complete.                           Scattered medium-mouthed diverticula were found in                            the left colon.                           Internal hemorrhoids were found during retroflexion.                           The colon was extremely tortous with significant                            looping. The cecum had a very angulated turn to                             enter it which required repositioning of the                            patient with abdominal pressure. The exam was                            otherwise without abnormality. Complications:            No immediate complications. Estimated blood loss:                            Minimal. Estimated Blood Loss:     Estimated blood loss was minimal. Impression:               - Two diminutive polyps in the ascending colon,  removed with a cold snare. Resected and retrieved.                           - Two 4 to 5 mm polyps in the transverse colon,                            removed with a cold snare. Resected and retrieved.                           - One 4 mm polyp in the sigmoid colon, removed with                            a cold snare. Resected and retrieved.                           - Diverticulosis in the left colon.                           - Internal hemorrhoids.                           - Tortous colon with looping                           - The examination was otherwise normal.                           Overall, suspect hemorrhoids caused (+) FOBT Recommendation:           - Patient has a contact number available for                            emergencies. The signs and symptoms of potential                            delayed complications were discussed with the                            patient. Return to normal activities tomorrow.                            Written discharge instructions were provided to the                            patient.                           - Resume previous diet.                           - Continue present medications.                           - Await pathology results.                           -  Repeat colonoscopy is recommended for                            surveillance. The colonoscopy date will be                            determined after pathology results from today's                             exam become available for review.                           - No ibuprofen, naproxen, or other non-steroidal                            anti-inflammatory drugs for 2 weeks after polyp                            removal.                           - Repeat CBC to ensure stable Hgb Remo Lipps P. Tad Fancher MD, MD 12/23/2016 3:12:07 PM This report has been signed electronically.

## 2016-12-23 NOTE — Progress Notes (Signed)
Report given to PACU, vss 

## 2016-12-24 ENCOUNTER — Telehealth: Payer: Self-pay

## 2016-12-24 DIAGNOSIS — N2581 Secondary hyperparathyroidism of renal origin: Secondary | ICD-10-CM | POA: Diagnosis not present

## 2016-12-24 DIAGNOSIS — N186 End stage renal disease: Secondary | ICD-10-CM | POA: Diagnosis not present

## 2016-12-24 NOTE — Telephone Encounter (Signed)
  Follow up Call-  Call back number 12/23/2016  Post procedure Call Back phone  # [phone] or [phone]  Permission to leave phone message Yes  Some recent data might be hidden     LMOM for pt to return call if any issues/questions and left reminder of on-call MD's number. Levada Dy RN

## 2016-12-27 ENCOUNTER — Telehealth: Payer: Self-pay

## 2016-12-27 ENCOUNTER — Other Ambulatory Visit: Payer: Self-pay

## 2016-12-27 DIAGNOSIS — D649 Anemia, unspecified: Secondary | ICD-10-CM

## 2016-12-27 DIAGNOSIS — N2581 Secondary hyperparathyroidism of renal origin: Secondary | ICD-10-CM | POA: Diagnosis not present

## 2016-12-27 DIAGNOSIS — N186 End stage renal disease: Secondary | ICD-10-CM | POA: Diagnosis not present

## 2016-12-27 NOTE — Telephone Encounter (Signed)
Left vm for patient to come to our lab to have CBC rechecked.

## 2016-12-29 DIAGNOSIS — N2581 Secondary hyperparathyroidism of renal origin: Secondary | ICD-10-CM | POA: Diagnosis not present

## 2016-12-29 DIAGNOSIS — N186 End stage renal disease: Secondary | ICD-10-CM | POA: Diagnosis not present

## 2016-12-31 DIAGNOSIS — N2581 Secondary hyperparathyroidism of renal origin: Secondary | ICD-10-CM | POA: Diagnosis not present

## 2016-12-31 DIAGNOSIS — N186 End stage renal disease: Secondary | ICD-10-CM | POA: Diagnosis not present

## 2017-01-03 DIAGNOSIS — N186 End stage renal disease: Secondary | ICD-10-CM | POA: Diagnosis not present

## 2017-01-03 DIAGNOSIS — N2581 Secondary hyperparathyroidism of renal origin: Secondary | ICD-10-CM | POA: Diagnosis not present

## 2017-01-05 DIAGNOSIS — N2581 Secondary hyperparathyroidism of renal origin: Secondary | ICD-10-CM | POA: Diagnosis not present

## 2017-01-05 DIAGNOSIS — N186 End stage renal disease: Secondary | ICD-10-CM | POA: Diagnosis not present

## 2017-01-07 DIAGNOSIS — N2581 Secondary hyperparathyroidism of renal origin: Secondary | ICD-10-CM | POA: Diagnosis not present

## 2017-01-07 DIAGNOSIS — N186 End stage renal disease: Secondary | ICD-10-CM | POA: Diagnosis not present

## 2017-01-10 DIAGNOSIS — N2581 Secondary hyperparathyroidism of renal origin: Secondary | ICD-10-CM | POA: Diagnosis not present

## 2017-01-10 DIAGNOSIS — N186 End stage renal disease: Secondary | ICD-10-CM | POA: Diagnosis not present

## 2017-01-12 DIAGNOSIS — N186 End stage renal disease: Secondary | ICD-10-CM | POA: Diagnosis not present

## 2017-01-12 DIAGNOSIS — N2581 Secondary hyperparathyroidism of renal origin: Secondary | ICD-10-CM | POA: Diagnosis not present

## 2017-01-14 DIAGNOSIS — N2581 Secondary hyperparathyroidism of renal origin: Secondary | ICD-10-CM | POA: Diagnosis not present

## 2017-01-14 DIAGNOSIS — N186 End stage renal disease: Secondary | ICD-10-CM | POA: Diagnosis not present

## 2017-01-17 DIAGNOSIS — N2581 Secondary hyperparathyroidism of renal origin: Secondary | ICD-10-CM | POA: Diagnosis not present

## 2017-01-17 DIAGNOSIS — N186 End stage renal disease: Secondary | ICD-10-CM | POA: Diagnosis not present

## 2017-01-17 DIAGNOSIS — Z992 Dependence on renal dialysis: Secondary | ICD-10-CM | POA: Diagnosis not present

## 2017-01-17 DIAGNOSIS — I129 Hypertensive chronic kidney disease with stage 1 through stage 4 chronic kidney disease, or unspecified chronic kidney disease: Secondary | ICD-10-CM | POA: Diagnosis not present

## 2017-01-19 DIAGNOSIS — N186 End stage renal disease: Secondary | ICD-10-CM | POA: Diagnosis not present

## 2017-01-19 DIAGNOSIS — N2581 Secondary hyperparathyroidism of renal origin: Secondary | ICD-10-CM | POA: Diagnosis not present

## 2017-01-21 DIAGNOSIS — N2581 Secondary hyperparathyroidism of renal origin: Secondary | ICD-10-CM | POA: Diagnosis not present

## 2017-01-21 DIAGNOSIS — N186 End stage renal disease: Secondary | ICD-10-CM | POA: Diagnosis not present

## 2017-01-24 DIAGNOSIS — N2581 Secondary hyperparathyroidism of renal origin: Secondary | ICD-10-CM | POA: Diagnosis not present

## 2017-01-24 DIAGNOSIS — N186 End stage renal disease: Secondary | ICD-10-CM | POA: Diagnosis not present

## 2017-01-26 DIAGNOSIS — N186 End stage renal disease: Secondary | ICD-10-CM | POA: Diagnosis not present

## 2017-01-26 DIAGNOSIS — N2581 Secondary hyperparathyroidism of renal origin: Secondary | ICD-10-CM | POA: Diagnosis not present

## 2017-01-28 DIAGNOSIS — N2581 Secondary hyperparathyroidism of renal origin: Secondary | ICD-10-CM | POA: Diagnosis not present

## 2017-01-28 DIAGNOSIS — N186 End stage renal disease: Secondary | ICD-10-CM | POA: Diagnosis not present

## 2017-01-31 DIAGNOSIS — N2581 Secondary hyperparathyroidism of renal origin: Secondary | ICD-10-CM | POA: Diagnosis not present

## 2017-01-31 DIAGNOSIS — N186 End stage renal disease: Secondary | ICD-10-CM | POA: Diagnosis not present

## 2017-02-02 DIAGNOSIS — N2581 Secondary hyperparathyroidism of renal origin: Secondary | ICD-10-CM | POA: Diagnosis not present

## 2017-02-02 DIAGNOSIS — N186 End stage renal disease: Secondary | ICD-10-CM | POA: Diagnosis not present

## 2017-02-04 DIAGNOSIS — N186 End stage renal disease: Secondary | ICD-10-CM | POA: Diagnosis not present

## 2017-02-04 DIAGNOSIS — N2581 Secondary hyperparathyroidism of renal origin: Secondary | ICD-10-CM | POA: Diagnosis not present

## 2017-02-07 DIAGNOSIS — N186 End stage renal disease: Secondary | ICD-10-CM | POA: Diagnosis not present

## 2017-02-07 DIAGNOSIS — N2581 Secondary hyperparathyroidism of renal origin: Secondary | ICD-10-CM | POA: Diagnosis not present

## 2017-02-09 DIAGNOSIS — N186 End stage renal disease: Secondary | ICD-10-CM | POA: Diagnosis not present

## 2017-02-09 DIAGNOSIS — N2581 Secondary hyperparathyroidism of renal origin: Secondary | ICD-10-CM | POA: Diagnosis not present

## 2017-02-11 DIAGNOSIS — N186 End stage renal disease: Secondary | ICD-10-CM | POA: Diagnosis not present

## 2017-02-11 DIAGNOSIS — N2581 Secondary hyperparathyroidism of renal origin: Secondary | ICD-10-CM | POA: Diagnosis not present

## 2017-02-14 DIAGNOSIS — N186 End stage renal disease: Secondary | ICD-10-CM | POA: Diagnosis not present

## 2017-02-14 DIAGNOSIS — N2581 Secondary hyperparathyroidism of renal origin: Secondary | ICD-10-CM | POA: Diagnosis not present

## 2017-02-16 DIAGNOSIS — N2581 Secondary hyperparathyroidism of renal origin: Secondary | ICD-10-CM | POA: Diagnosis not present

## 2017-02-16 DIAGNOSIS — N186 End stage renal disease: Secondary | ICD-10-CM | POA: Diagnosis not present

## 2017-02-17 DIAGNOSIS — Z992 Dependence on renal dialysis: Secondary | ICD-10-CM | POA: Diagnosis not present

## 2017-02-17 DIAGNOSIS — I129 Hypertensive chronic kidney disease with stage 1 through stage 4 chronic kidney disease, or unspecified chronic kidney disease: Secondary | ICD-10-CM | POA: Diagnosis not present

## 2017-02-17 DIAGNOSIS — N186 End stage renal disease: Secondary | ICD-10-CM | POA: Diagnosis not present

## 2017-02-18 DIAGNOSIS — N186 End stage renal disease: Secondary | ICD-10-CM | POA: Diagnosis not present

## 2017-02-18 DIAGNOSIS — N2581 Secondary hyperparathyroidism of renal origin: Secondary | ICD-10-CM | POA: Diagnosis not present

## 2017-02-21 DIAGNOSIS — N186 End stage renal disease: Secondary | ICD-10-CM | POA: Diagnosis not present

## 2017-02-21 DIAGNOSIS — N2581 Secondary hyperparathyroidism of renal origin: Secondary | ICD-10-CM | POA: Diagnosis not present

## 2017-02-23 DIAGNOSIS — N2581 Secondary hyperparathyroidism of renal origin: Secondary | ICD-10-CM | POA: Diagnosis not present

## 2017-02-23 DIAGNOSIS — N186 End stage renal disease: Secondary | ICD-10-CM | POA: Diagnosis not present

## 2017-02-25 DIAGNOSIS — N2581 Secondary hyperparathyroidism of renal origin: Secondary | ICD-10-CM | POA: Diagnosis not present

## 2017-02-25 DIAGNOSIS — N186 End stage renal disease: Secondary | ICD-10-CM | POA: Diagnosis not present

## 2017-02-28 DIAGNOSIS — N186 End stage renal disease: Secondary | ICD-10-CM | POA: Diagnosis not present

## 2017-02-28 DIAGNOSIS — N2581 Secondary hyperparathyroidism of renal origin: Secondary | ICD-10-CM | POA: Diagnosis not present

## 2017-03-02 DIAGNOSIS — N186 End stage renal disease: Secondary | ICD-10-CM | POA: Diagnosis not present

## 2017-03-02 DIAGNOSIS — N2581 Secondary hyperparathyroidism of renal origin: Secondary | ICD-10-CM | POA: Diagnosis not present

## 2017-03-04 DIAGNOSIS — N186 End stage renal disease: Secondary | ICD-10-CM | POA: Diagnosis not present

## 2017-03-04 DIAGNOSIS — N2581 Secondary hyperparathyroidism of renal origin: Secondary | ICD-10-CM | POA: Diagnosis not present

## 2017-03-07 DIAGNOSIS — N2581 Secondary hyperparathyroidism of renal origin: Secondary | ICD-10-CM | POA: Diagnosis not present

## 2017-03-07 DIAGNOSIS — N186 End stage renal disease: Secondary | ICD-10-CM | POA: Diagnosis not present

## 2017-03-09 DIAGNOSIS — N186 End stage renal disease: Secondary | ICD-10-CM | POA: Diagnosis not present

## 2017-03-09 DIAGNOSIS — N2581 Secondary hyperparathyroidism of renal origin: Secondary | ICD-10-CM | POA: Diagnosis not present

## 2017-03-11 DIAGNOSIS — N2581 Secondary hyperparathyroidism of renal origin: Secondary | ICD-10-CM | POA: Diagnosis not present

## 2017-03-11 DIAGNOSIS — N186 End stage renal disease: Secondary | ICD-10-CM | POA: Diagnosis not present

## 2017-03-14 DIAGNOSIS — N186 End stage renal disease: Secondary | ICD-10-CM | POA: Diagnosis not present

## 2017-03-14 DIAGNOSIS — N2581 Secondary hyperparathyroidism of renal origin: Secondary | ICD-10-CM | POA: Diagnosis not present

## 2017-03-16 DIAGNOSIS — N186 End stage renal disease: Secondary | ICD-10-CM | POA: Diagnosis not present

## 2017-03-16 DIAGNOSIS — N2581 Secondary hyperparathyroidism of renal origin: Secondary | ICD-10-CM | POA: Diagnosis not present

## 2017-03-18 DIAGNOSIS — N2581 Secondary hyperparathyroidism of renal origin: Secondary | ICD-10-CM | POA: Diagnosis not present

## 2017-03-18 DIAGNOSIS — N186 End stage renal disease: Secondary | ICD-10-CM | POA: Diagnosis not present

## 2017-03-19 DIAGNOSIS — N186 End stage renal disease: Secondary | ICD-10-CM | POA: Diagnosis not present

## 2017-03-19 DIAGNOSIS — Z992 Dependence on renal dialysis: Secondary | ICD-10-CM | POA: Diagnosis not present

## 2017-03-19 DIAGNOSIS — I129 Hypertensive chronic kidney disease with stage 1 through stage 4 chronic kidney disease, or unspecified chronic kidney disease: Secondary | ICD-10-CM | POA: Diagnosis not present

## 2017-03-21 DIAGNOSIS — N2581 Secondary hyperparathyroidism of renal origin: Secondary | ICD-10-CM | POA: Diagnosis not present

## 2017-03-21 DIAGNOSIS — N186 End stage renal disease: Secondary | ICD-10-CM | POA: Diagnosis not present

## 2017-03-23 DIAGNOSIS — N186 End stage renal disease: Secondary | ICD-10-CM | POA: Diagnosis not present

## 2017-03-23 DIAGNOSIS — N2581 Secondary hyperparathyroidism of renal origin: Secondary | ICD-10-CM | POA: Diagnosis not present

## 2017-03-24 DIAGNOSIS — N186 End stage renal disease: Secondary | ICD-10-CM | POA: Diagnosis not present

## 2017-03-24 DIAGNOSIS — Z992 Dependence on renal dialysis: Secondary | ICD-10-CM | POA: Diagnosis not present

## 2017-03-24 DIAGNOSIS — T82858A Stenosis of vascular prosthetic devices, implants and grafts, initial encounter: Secondary | ICD-10-CM | POA: Diagnosis not present

## 2017-03-24 DIAGNOSIS — I871 Compression of vein: Secondary | ICD-10-CM | POA: Diagnosis not present

## 2017-03-25 DIAGNOSIS — N2581 Secondary hyperparathyroidism of renal origin: Secondary | ICD-10-CM | POA: Diagnosis not present

## 2017-03-25 DIAGNOSIS — N186 End stage renal disease: Secondary | ICD-10-CM | POA: Diagnosis not present

## 2017-03-28 DIAGNOSIS — N186 End stage renal disease: Secondary | ICD-10-CM | POA: Diagnosis not present

## 2017-03-28 DIAGNOSIS — N2581 Secondary hyperparathyroidism of renal origin: Secondary | ICD-10-CM | POA: Diagnosis not present

## 2017-03-30 DIAGNOSIS — N2581 Secondary hyperparathyroidism of renal origin: Secondary | ICD-10-CM | POA: Diagnosis not present

## 2017-03-30 DIAGNOSIS — N186 End stage renal disease: Secondary | ICD-10-CM | POA: Diagnosis not present

## 2017-04-01 DIAGNOSIS — N186 End stage renal disease: Secondary | ICD-10-CM | POA: Diagnosis not present

## 2017-04-01 DIAGNOSIS — N2581 Secondary hyperparathyroidism of renal origin: Secondary | ICD-10-CM | POA: Diagnosis not present

## 2017-04-04 DIAGNOSIS — N186 End stage renal disease: Secondary | ICD-10-CM | POA: Diagnosis not present

## 2017-04-04 DIAGNOSIS — N2581 Secondary hyperparathyroidism of renal origin: Secondary | ICD-10-CM | POA: Diagnosis not present

## 2017-04-06 DIAGNOSIS — N2581 Secondary hyperparathyroidism of renal origin: Secondary | ICD-10-CM | POA: Diagnosis not present

## 2017-04-06 DIAGNOSIS — N186 End stage renal disease: Secondary | ICD-10-CM | POA: Diagnosis not present

## 2017-04-08 DIAGNOSIS — N186 End stage renal disease: Secondary | ICD-10-CM | POA: Diagnosis not present

## 2017-04-08 DIAGNOSIS — N2581 Secondary hyperparathyroidism of renal origin: Secondary | ICD-10-CM | POA: Diagnosis not present

## 2017-04-11 DIAGNOSIS — N2581 Secondary hyperparathyroidism of renal origin: Secondary | ICD-10-CM | POA: Diagnosis not present

## 2017-04-11 DIAGNOSIS — N186 End stage renal disease: Secondary | ICD-10-CM | POA: Diagnosis not present

## 2017-04-13 DIAGNOSIS — N186 End stage renal disease: Secondary | ICD-10-CM | POA: Diagnosis not present

## 2017-04-13 DIAGNOSIS — N2581 Secondary hyperparathyroidism of renal origin: Secondary | ICD-10-CM | POA: Diagnosis not present

## 2017-04-15 DIAGNOSIS — N186 End stage renal disease: Secondary | ICD-10-CM | POA: Diagnosis not present

## 2017-04-15 DIAGNOSIS — N2581 Secondary hyperparathyroidism of renal origin: Secondary | ICD-10-CM | POA: Diagnosis not present

## 2017-04-18 DIAGNOSIS — N186 End stage renal disease: Secondary | ICD-10-CM | POA: Diagnosis not present

## 2017-04-18 DIAGNOSIS — N2581 Secondary hyperparathyroidism of renal origin: Secondary | ICD-10-CM | POA: Diagnosis not present

## 2017-04-19 DIAGNOSIS — N186 End stage renal disease: Secondary | ICD-10-CM | POA: Diagnosis not present

## 2017-04-19 DIAGNOSIS — Z992 Dependence on renal dialysis: Secondary | ICD-10-CM | POA: Diagnosis not present

## 2017-04-19 DIAGNOSIS — I129 Hypertensive chronic kidney disease with stage 1 through stage 4 chronic kidney disease, or unspecified chronic kidney disease: Secondary | ICD-10-CM | POA: Diagnosis not present

## 2017-04-20 DIAGNOSIS — N2581 Secondary hyperparathyroidism of renal origin: Secondary | ICD-10-CM | POA: Diagnosis not present

## 2017-04-20 DIAGNOSIS — N186 End stage renal disease: Secondary | ICD-10-CM | POA: Diagnosis not present

## 2017-04-22 DIAGNOSIS — N186 End stage renal disease: Secondary | ICD-10-CM | POA: Diagnosis not present

## 2017-04-22 DIAGNOSIS — N2581 Secondary hyperparathyroidism of renal origin: Secondary | ICD-10-CM | POA: Diagnosis not present

## 2017-04-25 DIAGNOSIS — N186 End stage renal disease: Secondary | ICD-10-CM | POA: Diagnosis not present

## 2017-04-25 DIAGNOSIS — N2581 Secondary hyperparathyroidism of renal origin: Secondary | ICD-10-CM | POA: Diagnosis not present

## 2017-04-27 DIAGNOSIS — N2581 Secondary hyperparathyroidism of renal origin: Secondary | ICD-10-CM | POA: Diagnosis not present

## 2017-04-27 DIAGNOSIS — N186 End stage renal disease: Secondary | ICD-10-CM | POA: Diagnosis not present

## 2017-04-29 DIAGNOSIS — N186 End stage renal disease: Secondary | ICD-10-CM | POA: Diagnosis not present

## 2017-04-29 DIAGNOSIS — N2581 Secondary hyperparathyroidism of renal origin: Secondary | ICD-10-CM | POA: Diagnosis not present

## 2017-05-02 DIAGNOSIS — N2581 Secondary hyperparathyroidism of renal origin: Secondary | ICD-10-CM | POA: Diagnosis not present

## 2017-05-02 DIAGNOSIS — N186 End stage renal disease: Secondary | ICD-10-CM | POA: Diagnosis not present

## 2017-05-04 DIAGNOSIS — N2581 Secondary hyperparathyroidism of renal origin: Secondary | ICD-10-CM | POA: Diagnosis not present

## 2017-05-04 DIAGNOSIS — N186 End stage renal disease: Secondary | ICD-10-CM | POA: Diagnosis not present

## 2017-05-06 DIAGNOSIS — N2581 Secondary hyperparathyroidism of renal origin: Secondary | ICD-10-CM | POA: Diagnosis not present

## 2017-05-06 DIAGNOSIS — N186 End stage renal disease: Secondary | ICD-10-CM | POA: Diagnosis not present

## 2017-05-09 DIAGNOSIS — N186 End stage renal disease: Secondary | ICD-10-CM | POA: Diagnosis not present

## 2017-05-09 DIAGNOSIS — N2581 Secondary hyperparathyroidism of renal origin: Secondary | ICD-10-CM | POA: Diagnosis not present

## 2017-05-11 DIAGNOSIS — N186 End stage renal disease: Secondary | ICD-10-CM | POA: Diagnosis not present

## 2017-05-11 DIAGNOSIS — N2581 Secondary hyperparathyroidism of renal origin: Secondary | ICD-10-CM | POA: Diagnosis not present

## 2017-05-13 DIAGNOSIS — N2581 Secondary hyperparathyroidism of renal origin: Secondary | ICD-10-CM | POA: Diagnosis not present

## 2017-05-13 DIAGNOSIS — N186 End stage renal disease: Secondary | ICD-10-CM | POA: Diagnosis not present

## 2017-05-16 DIAGNOSIS — N2581 Secondary hyperparathyroidism of renal origin: Secondary | ICD-10-CM | POA: Diagnosis not present

## 2017-05-16 DIAGNOSIS — N186 End stage renal disease: Secondary | ICD-10-CM | POA: Diagnosis not present

## 2017-05-18 DIAGNOSIS — N2581 Secondary hyperparathyroidism of renal origin: Secondary | ICD-10-CM | POA: Diagnosis not present

## 2017-05-18 DIAGNOSIS — N186 End stage renal disease: Secondary | ICD-10-CM | POA: Diagnosis not present

## 2017-05-20 DIAGNOSIS — N186 End stage renal disease: Secondary | ICD-10-CM | POA: Diagnosis not present

## 2017-05-20 DIAGNOSIS — N2581 Secondary hyperparathyroidism of renal origin: Secondary | ICD-10-CM | POA: Diagnosis not present

## 2017-05-20 DIAGNOSIS — Z992 Dependence on renal dialysis: Secondary | ICD-10-CM | POA: Diagnosis not present

## 2017-05-20 DIAGNOSIS — I129 Hypertensive chronic kidney disease with stage 1 through stage 4 chronic kidney disease, or unspecified chronic kidney disease: Secondary | ICD-10-CM | POA: Diagnosis not present

## 2017-05-23 DIAGNOSIS — N2581 Secondary hyperparathyroidism of renal origin: Secondary | ICD-10-CM | POA: Diagnosis not present

## 2017-05-23 DIAGNOSIS — N186 End stage renal disease: Secondary | ICD-10-CM | POA: Diagnosis not present

## 2017-05-25 DIAGNOSIS — N2581 Secondary hyperparathyroidism of renal origin: Secondary | ICD-10-CM | POA: Diagnosis not present

## 2017-05-25 DIAGNOSIS — N186 End stage renal disease: Secondary | ICD-10-CM | POA: Diagnosis not present

## 2017-05-27 DIAGNOSIS — N186 End stage renal disease: Secondary | ICD-10-CM | POA: Diagnosis not present

## 2017-05-27 DIAGNOSIS — N2581 Secondary hyperparathyroidism of renal origin: Secondary | ICD-10-CM | POA: Diagnosis not present

## 2017-05-30 DIAGNOSIS — N2581 Secondary hyperparathyroidism of renal origin: Secondary | ICD-10-CM | POA: Diagnosis not present

## 2017-05-30 DIAGNOSIS — Z23 Encounter for immunization: Secondary | ICD-10-CM | POA: Diagnosis not present

## 2017-05-30 DIAGNOSIS — N186 End stage renal disease: Secondary | ICD-10-CM | POA: Diagnosis not present

## 2017-06-01 DIAGNOSIS — N2581 Secondary hyperparathyroidism of renal origin: Secondary | ICD-10-CM | POA: Diagnosis not present

## 2017-06-01 DIAGNOSIS — Z23 Encounter for immunization: Secondary | ICD-10-CM | POA: Diagnosis not present

## 2017-06-01 DIAGNOSIS — N186 End stage renal disease: Secondary | ICD-10-CM | POA: Diagnosis not present

## 2017-06-03 DIAGNOSIS — N2581 Secondary hyperparathyroidism of renal origin: Secondary | ICD-10-CM | POA: Diagnosis not present

## 2017-06-03 DIAGNOSIS — N186 End stage renal disease: Secondary | ICD-10-CM | POA: Diagnosis not present

## 2017-06-03 DIAGNOSIS — Z23 Encounter for immunization: Secondary | ICD-10-CM | POA: Diagnosis not present

## 2017-06-06 DIAGNOSIS — N186 End stage renal disease: Secondary | ICD-10-CM | POA: Diagnosis not present

## 2017-06-06 DIAGNOSIS — N2581 Secondary hyperparathyroidism of renal origin: Secondary | ICD-10-CM | POA: Diagnosis not present

## 2017-06-08 DIAGNOSIS — N2581 Secondary hyperparathyroidism of renal origin: Secondary | ICD-10-CM | POA: Diagnosis not present

## 2017-06-08 DIAGNOSIS — N186 End stage renal disease: Secondary | ICD-10-CM | POA: Diagnosis not present

## 2017-06-10 DIAGNOSIS — N186 End stage renal disease: Secondary | ICD-10-CM | POA: Diagnosis not present

## 2017-06-10 DIAGNOSIS — N2581 Secondary hyperparathyroidism of renal origin: Secondary | ICD-10-CM | POA: Diagnosis not present

## 2017-06-12 ENCOUNTER — Observation Stay (HOSPITAL_COMMUNITY)
Admission: EM | Admit: 2017-06-12 | Discharge: 2017-06-13 | Disposition: A | Discharge status: Home / Self Care | Payer: BLUE CROSS/BLUE SHIELD | Attending: Internal Medicine | Admitting: Internal Medicine

## 2017-06-12 ENCOUNTER — Encounter (HOSPITAL_COMMUNITY): Payer: Self-pay

## 2017-06-12 DIAGNOSIS — Z992 Dependence on renal dialysis: Secondary | ICD-10-CM | POA: Insufficient documentation

## 2017-06-12 DIAGNOSIS — R109 Unspecified abdominal pain: Secondary | ICD-10-CM | POA: Insufficient documentation

## 2017-06-12 DIAGNOSIS — R531 Weakness: Secondary | ICD-10-CM | POA: Diagnosis not present

## 2017-06-12 DIAGNOSIS — N5082 Scrotal pain: Secondary | ICD-10-CM | POA: Diagnosis not present

## 2017-06-12 DIAGNOSIS — E1122 Type 2 diabetes mellitus with diabetic chronic kidney disease: Secondary | ICD-10-CM | POA: Diagnosis not present

## 2017-06-12 DIAGNOSIS — G8929 Other chronic pain: Secondary | ICD-10-CM | POA: Insufficient documentation

## 2017-06-12 DIAGNOSIS — Z23 Encounter for immunization: Secondary | ICD-10-CM | POA: Insufficient documentation

## 2017-06-12 DIAGNOSIS — I10 Essential (primary) hypertension: Secondary | ICD-10-CM

## 2017-06-12 DIAGNOSIS — I120 Hypertensive chronic kidney disease with stage 5 chronic kidney disease or end stage renal disease: Secondary | ICD-10-CM | POA: Insufficient documentation

## 2017-06-12 DIAGNOSIS — D631 Anemia in chronic kidney disease: Secondary | ICD-10-CM | POA: Insufficient documentation

## 2017-06-12 DIAGNOSIS — N19 Unspecified kidney failure: Secondary | ICD-10-CM | POA: Diagnosis not present

## 2017-06-12 DIAGNOSIS — Z794 Long term (current) use of insulin: Secondary | ICD-10-CM | POA: Diagnosis not present

## 2017-06-12 DIAGNOSIS — N186 End stage renal disease: Secondary | ICD-10-CM | POA: Diagnosis not present

## 2017-06-12 DIAGNOSIS — M545 Low back pain: Secondary | ICD-10-CM | POA: Insufficient documentation

## 2017-06-12 DIAGNOSIS — E875 Hyperkalemia: Secondary | ICD-10-CM | POA: Diagnosis not present

## 2017-06-12 DIAGNOSIS — Z79899 Other long term (current) drug therapy: Secondary | ICD-10-CM | POA: Insufficient documentation

## 2017-06-12 DIAGNOSIS — N189 Chronic kidney disease, unspecified: Secondary | ICD-10-CM

## 2017-06-12 LAB — I-STAT CHEM 8, ED
BUN: 94 mg/dL — AB (ref 6–20)
CALCIUM ION: 2.35 mmol/L — AB (ref 1.15–1.40)
CHLORIDE: 102 mmol/L (ref 101–111)
CREATININE: 16.2 mg/dL — AB (ref 0.61–1.24)
GLUCOSE: 268 mg/dL — AB (ref 65–99)
HEMATOCRIT: 30 % — AB (ref 39.0–52.0)
Hemoglobin: 10.2 g/dL — ABNORMAL LOW (ref 13.0–17.0)
Potassium: 5.8 mmol/L — ABNORMAL HIGH (ref 3.5–5.1)
Sodium: 129 mmol/L — ABNORMAL LOW (ref 135–145)
TCO2: 20 mmol/L — AB (ref 22–32)

## 2017-06-12 LAB — CBC
HEMATOCRIT: 35.7 % — AB (ref 39.0–52.0)
HEMOGLOBIN: 11.9 g/dL — AB (ref 13.0–17.0)
MCH: 32.4 pg (ref 26.0–34.0)
MCHC: 33.3 g/dL (ref 30.0–36.0)
MCV: 97.3 fL (ref 78.0–100.0)
Platelets: 271 10*3/uL (ref 150–400)
RBC: 3.67 MIL/uL — AB (ref 4.22–5.81)
RDW: 13.8 % (ref 11.5–15.5)
WBC: 7 10*3/uL (ref 4.0–10.5)

## 2017-06-12 LAB — POC OCCULT BLOOD, ED: FECAL OCCULT BLD: NEGATIVE

## 2017-06-12 LAB — LIPASE, BLOOD: LIPASE: 120 U/L — AB (ref 11–51)

## 2017-06-12 MED ORDER — HYDRALAZINE HCL 20 MG/ML IJ SOLN: 5.0000 mg | INTRAMUSCULAR | Status: DC | PRN

## 2017-06-12 MED ORDER — SODIUM CHLORIDE 0.9 % IV SOLN: 100.0000 mL | INTRAVENOUS | Status: DC | PRN

## 2017-06-12 MED ORDER — DEXTROSE 50 % IV SOLN
1.0000 | Freq: Once | INTRAVENOUS | Status: AC
  Administered 2017-06-12: 50 mL via INTRAVENOUS
  Filled 2017-06-12: qty 50

## 2017-06-12 MED ORDER — LIDOCAINE-PRILOCAINE 2.5-2.5 % EX CREA
1.0000 | TOPICAL_CREAM | CUTANEOUS | Status: DC | PRN
Start: 2017-06-12 — End: 2017-06-13
  Filled 2017-06-12: qty 5

## 2017-06-12 MED ORDER — ALBUTEROL SULFATE (2.5 MG/3ML) 0.083% IN NEBU: 10.0000 mg | INHALATION_SOLUTION | Freq: Once | RESPIRATORY_TRACT | Status: DC

## 2017-06-12 MED ORDER — ALTEPLASE 2 MG IJ SOLR: 2.0000 mg | Freq: Once | INTRAMUSCULAR | Status: DC | PRN

## 2017-06-12 MED ORDER — AMLODIPINE BESYLATE 10 MG PO TABS
10.0000 mg | ORAL_TABLET | Freq: Every day | ORAL | Status: DC
  Administered 2017-06-13: 10 mg via ORAL
  Filled 2017-06-12: qty 1

## 2017-06-12 MED ORDER — CARVEDILOL 12.5 MG PO TABS
25.0000 mg | ORAL_TABLET | Freq: Two times a day (BID) | ORAL | Status: DC
  Administered 2017-06-13: 25 mg via ORAL
  Filled 2017-06-12: qty 2

## 2017-06-12 MED ORDER — INSULIN GLARGINE 100 UNIT/ML ~~LOC~~ SOLN
5.0000 [IU] | Freq: Every day | SUBCUTANEOUS | Status: DC
  Administered 2017-06-13: 5 [IU] via SUBCUTANEOUS
  Filled 2017-06-12 (×2): qty 0.05

## 2017-06-12 MED ORDER — OXYCODONE HCL 5 MG PO TABS
ORAL_TABLET | ORAL | Status: AC
  Administered 2017-06-12: 10 mg via ORAL
  Filled 2017-06-12: qty 2

## 2017-06-12 MED ORDER — ZOLPIDEM TARTRATE 5 MG PO TABS: 5.0000 mg | ORAL_TABLET | Freq: Every evening | ORAL | Status: DC | PRN

## 2017-06-12 MED ORDER — INSULIN ASPART 100 UNIT/ML IV SOLN
10.0000 [IU] | Freq: Once | INTRAVENOUS | Status: DC
  Filled 2017-06-12: qty 0.1

## 2017-06-12 MED ORDER — LIDOCAINE HCL (PF) 1 % IJ SOLN: 5.0000 mL | INTRAMUSCULAR | Status: DC | PRN

## 2017-06-12 MED ORDER — ACETAMINOPHEN 325 MG PO TABS: 650.0000 mg | ORAL_TABLET | Freq: Four times a day (QID) | ORAL | Status: DC | PRN

## 2017-06-12 MED ORDER — RENA-VITE PO TABS: 1.0000 | ORAL_TABLET | Freq: Every day | ORAL | Status: DC

## 2017-06-12 MED ORDER — PENTAFLUOROPROP-TETRAFLUOROETH EX AERO
1.0000 "application " | INHALATION_SPRAY | CUTANEOUS | Status: DC | PRN
  Filled 2017-06-12: qty 30

## 2017-06-12 MED ORDER — HEPARIN SODIUM (PORCINE) 1000 UNIT/ML DIALYSIS
1000.0000 [IU] | INTRAMUSCULAR | Status: DC | PRN
  Filled 2017-06-12: qty 1

## 2017-06-12 MED ORDER — ONDANSETRON HCL 4 MG/2ML IJ SOLN: 4.0000 mg | Freq: Three times a day (TID) | INTRAMUSCULAR | Status: DC | PRN

## 2017-06-12 MED ORDER — OXYCODONE HCL 5 MG PO TABS
10.0000 mg | ORAL_TABLET | Freq: Once | ORAL | Status: AC
  Administered 2017-06-12: 10 mg via ORAL

## 2017-06-12 MED ORDER — SUCROFERRIC OXYHYDROXIDE 500 MG PO CHEW
500.0000 mg | CHEWABLE_TABLET | Freq: Three times a day (TID) | ORAL | Status: DC
  Filled 2017-06-12 (×3): qty 1

## 2017-06-12 MED ORDER — SODIUM BICARBONATE 8.4 % IV SOLN
50.0000 meq | Freq: Once | INTRAVENOUS | Status: AC
  Administered 2017-06-12: 50 meq via INTRAVENOUS
  Filled 2017-06-12: qty 50

## 2017-06-12 MED ORDER — SODIUM CHLORIDE 0.9 % IV SOLN
1.0000 g | Freq: Once | INTRAVENOUS | Status: AC
  Administered 2017-06-12: 1 g via INTRAVENOUS
  Filled 2017-06-12: qty 10

## 2017-06-12 MED ORDER — CLONIDINE HCL 0.1 MG PO TABS
0.1000 mg | ORAL_TABLET | Freq: Every day | ORAL | Status: DC
  Administered 2017-06-13: 0.1 mg via ORAL
  Filled 2017-06-12: qty 1

## 2017-06-12 MED ORDER — TRAMADOL HCL 50 MG PO TABS: 50.0000 mg | ORAL_TABLET | Freq: Four times a day (QID) | ORAL | Status: DC | PRN

## 2017-06-12 MED ORDER — INSULIN ASPART 100 UNIT/ML ~~LOC~~ SOLN
0.0000 [IU] | Freq: Three times a day (TID) | SUBCUTANEOUS | Status: DC
  Administered 2017-06-13: 3 [IU] via SUBCUTANEOUS
  Administered 2017-06-13: 5 [IU] via SUBCUTANEOUS

## 2017-06-12 MED ORDER — INSULIN ASPART 100 UNIT/ML ~~LOC~~ SOLN
6.0000 [IU] | Freq: Once | SUBCUTANEOUS | Status: AC
  Administered 2017-06-12: 6 [IU] via INTRAVENOUS
  Filled 2017-06-12: qty 1

## 2017-06-12 MED ORDER — HEPARIN SODIUM (PORCINE) 5000 UNIT/ML IJ SOLN
5000.0000 [IU] | Freq: Three times a day (TID) | INTRAMUSCULAR | Status: DC
  Administered 2017-06-13: 5000 [IU] via SUBCUTANEOUS
  Filled 2017-06-12: qty 1

## 2017-06-12 MED ORDER — HEPARIN SODIUM (PORCINE) 1000 UNIT/ML DIALYSIS
3000.0000 [IU] | Freq: Once | INTRAMUSCULAR | Status: DC
  Filled 2017-06-12: qty 3

## 2017-06-12 NOTE — ED Provider Notes (Signed)
[facility] DEPT Provider Note   CSN: [id number] Arrival date & time: 06/12/17  1544     History   Chief Complaint Chief Complaint  Patient presents with  . Abdominal Pain  . Back Pain  . Numbness    HPI Jonathan Horton is a 56 y.o. male.  HPI   56 year old male with history of end-stage renal disease currently on M-W-F dialysis, history of anemia, diabetes presenting for evaluation of generalized weakness. Patient states for the past 5 days he has had progressive weakness to his extremities. States that his leg gave out when he walks and has several near fall episodes.he also complaining of urinating less than usual. States when he does urinate only a few drops came out. He denies having increased urge to urinate or having bladder pain. He is a dialysis patient and haven't been missing any dialysis. Does report having bouts of lightheadedness for the same duration.He mentioned that symptoms felt similar to when he was diagnosed with anemia requiring blood transfusion the past. Patient denies any recent sickness, having chest pain, or shortness of breath. Does complain of chronic low back pain and pain towards his scrotal region for the same duration. He attributed to having to lay in a uncomfortable bed while receiving dialysis on a regular basis. Does report having lower back pain with defecation.  Denies hx of IVDU or active cancer.    Past Medical History:  Diagnosis Date  . Anemia   . Chronic kidney disease   . Diabetes mellitus without complication ([place])   . History of blood transfusion 05/2016  . Hypertension     Patient Active Problem List   Diagnosis Date Noted  . Special screening for malignant neoplasms, colon 12/23/2016  . Anemia 06/10/2016  . ESRD on dialysis ([place]) 06/10/2016  . Anemia of chronic kidney failure 06/10/2016  . S/P dialysis catheter insertion ([place])   . Renal failure 06/07/2016  . Acute renal failure (ARF) ([place]) 06/07/2016  . Essential hypertension  06/07/2016  . Diabetes mellitus ([place]) 06/07/2016    Past Surgical History:  Procedure Laterality Date  . AV FISTULA PLACEMENT Left 06/08/2016   Procedure: ARTERIOVENOUS (AV) FISTULA CREATION;  Surgeon: Angelia Mould, MD;  Location: [facility];  Service: Vascular;  Laterality: Left;  . INSERTION OF DIALYSIS CATHETER Right 06/08/2016   Procedure: INSERTION OF DIALYSIS CATHETER;  Surgeon: Angelia Mould, MD;  Location: [facility];  Service: Vascular;  Laterality: Right;       Home Medications    Prior to Admission medications   Medication Sig Start Date End Date Taking? Authorizing Provider  amLODipine (NORVASC) 10 MG tablet Take 10 mg by mouth daily. 05/01/16   [provider]  calcium acetate (PHOSLO) 667 MG capsule Take 1 capsule (667 mg total) by mouth 3 (three) times daily with meals. 06/10/16   Dhungel, Flonnie Overman, MD  carvedilol (COREG) 25 MG tablet Take 25 mg by mouth 2 (two) times daily. 05/01/16   [provider]  cloNIDine (CATAPRES) 0.1 MG tablet Take 0.1 mg by mouth. 01/03/14   [provider]  multivitamin (RENA-VIT) TABS tablet Take 1 tablet by mouth at bedtime. 06/10/16   Dhungel, Flonnie Overman, MD  ONE TOUCH ULTRA TEST test strip  10/19/16   [provider]  traMADol Veatrice Bourbon) 50 MG tablet Take by mouth. 01/08/14   [provider]  VELPHORO 500 MG chewable tablet  11/25/16   [provider]    Family History Family History  Problem Relation Age of  Onset  . Colon cancer Neg Hx   . Esophageal cancer Neg Hx   . Rectal cancer Neg Hx   . Stomach cancer Neg Hx     Social History Social History  Substance Use Topics  . Smoking status: Never Smoker  . Smokeless tobacco: Never Used  . Alcohol use No     Allergies   Patient has no known allergies.   Review of Systems Review of Systems  All other systems reviewed and are negative.    Physical Exam Updated Vital Signs BP (!) 161/89   Pulse 72   Temp (!) 97.4 F (36.3  C) (Oral)   Resp 16   Ht 5\' 9"  (1.753 m)   Wt 79.6 kg (175 lb 7.8 oz)   SpO2 99%   BMI 25.91 kg/m   Physical Exam  Constitutional: He appears well-developed and well-nourished. No distress.  HENT:  Head: Atraumatic.  Eyes: Conjunctivae are normal.  Neck: Neck supple.  Cardiovascular: Normal rate and regular rhythm.   Pulmonary/Chest: Effort normal and breath sounds normal.  Abdominal: Soft. Bowel sounds are normal. He exhibits no distension. There is no tenderness.  Genitourinary:  Genitourinary Comments: Chaperone present during exam. Normal rectal tone, no external hemorrhoid, no obvious mass, normal color stool on glove, normal prostate.  Neurological: He is alert.  5 out 5 strength to upper extremity bilaterally, 4 out of 5 strength to lower extremity bilaterally with intact patella DTR  Skin: No rash noted.  Psychiatric: He has a normal mood and affect.  Nursing note and vitals reviewed.    ED Treatments / Results  Labs (all labs ordered are listed, but only abnormal results are displayed) Labs Reviewed  CBC - Abnormal; Notable for the following:       Result Value   RBC 3.67 (*)    Hemoglobin 11.9 (*)    HCT 35.7 (*)    All other components within normal limits  COMPREHENSIVE METABOLIC PANEL - Abnormal; Notable for the following:    Sodium 123 (*)    Potassium >7.5 (*)    Chloride 88 (*)    CO2 18 (*)    Glucose, Bld 388 (*)    BUN 107 (*)    Creatinine, Ser 18.05 (*)    Total Protein 8.5 (*)    GFR calc non Af Amer 2 (*)    GFR calc Af Amer 3 (*)    Anion gap 17 (*)    All other components within normal limits  LIPASE, BLOOD - Abnormal; Notable for the following:    Lipase 120 (*)    All other components within normal limits  I-STAT CHEM 8, ED - Abnormal; Notable for the following:    Sodium 129 (*)    Potassium 5.8 (*)    BUN 94 (*)    Creatinine, Ser 16.20 (*)    Glucose, Bld 268 (*)    Calcium, Ion 2.35 (*)    TCO2 20 (*)    Hemoglobin 10.2 (*)     HCT 30.0 (*)    All other components within normal limits  URINALYSIS, ROUTINE W REFLEX MICROSCOPIC  POC OCCULT BLOOD, ED    EKG  EKG Interpretation None     ED ECG REPORT   Date: 06/12/2017  Rate: 74  Rhythm: normal sinus rhythm  QRS Axis: left  Intervals: normal  ST/T Wave abnormalities: nonspecific ST/T changes  Conduction Disutrbances:left anterior fascicular block  Narrative Interpretation: peaked T-waves  Old EKG Reviewed: changes noted  I have personally reviewed the EKG tracing and agree with the computerized printout as noted.   Radiology No results found.  Procedures Procedures (including critical care time)  Medications Ordered in ED Medications  calcium gluconate 1 g in sodium chloride 0.9 % 100 mL IVPB (1 g Intravenous New Bag/Given 06/12/17 2021)  sodium bicarbonate injection 50 mEq (50 mEq Intravenous Given 06/12/17 2020)  insulin aspart (novoLOG) injection 6 Units (6 Units Intravenous Given 06/12/17 2021)  dextrose 50 % solution 50 mL (50 mLs Intravenous Given 06/12/17 2020)     Initial Impression / Assessment and Plan / ED Course  I have reviewed the triage vital signs and the nursing notes.  Pertinent labs & imaging results that were available during my care of the patient were reviewed by me and considered in my medical decision making (see chart for details).     BP (!) 168/95   Pulse 73   Temp (!) 97.4 F (36.3 C) (Oral)   Resp 18   Ht 5\' 9"  (1.753 m)   Wt 79.6 kg (175 lb 7.8 oz)   SpO2 98%   BMI 25.91 kg/m    Final Clinical Impressions(s) / ED Diagnoses   Final diagnoses:  Hyperkalemia  Weakness  ESRD (end stage renal disease) (HCC)    New Prescriptions New Prescriptions   No medications on file   This is a patient has history of diabetes, and end-stage renal disease currently on dialysis here with denies weakness as well as decreasing urinary output. He has normal rectal tone,strength with poor effort but no focal deficit  and half intact patellar deep tendon reflex. He does not have any significant reproducible pain on exam. Workup initiated. Care discussed with Dr. Kathrynn Humble.   7:49 PM Evidence of significant hyperkalemia with potassium greater than 7.5. This is after a redrawn from earlier when labs notified his K+ was 7.4.  Also elevated lipase of 120. Evidence of chronic kidney disease with a BUN of 107, creatinine 18. He is also hyperglycemic with a blood sugar of 388.  Will treat hyperkalemia with albuterol/calcium gluconate/insulin/sodium bicarb.    8:09 PM EKG showing diffused Peak T-waves, consistent with hyperkalemia.  Appreciate consultation from nephrologist Dr. Melvia Heaps who agrees to see pt in the ER for emergent dialysis.    8:40 PM Dr. Melvia Heaps has seen and evaluate pt and will bring pt in for emergent dialysis.   10:12 PM Appreciate consultation from [facility] Dr. Blaine Hamper who agrees to admit pt after emergent dialysis.    CRITICAL CARE Performed by: Domenic Moras Total critical care time: 35 minutes Critical care time was exclusive of separately billable procedures and treating other patients. Critical care was necessary to treat or prevent imminent or life-threatening deterioration. Critical care was time spent personally by me on the following activities: development of treatment plan with patient and/or surrogate as well as nursing, discussions with consultants, evaluation of patient's response to treatment, examination of patient, obtaining history from patient or surrogate, ordering and performing treatments and interventions, ordering and review of laboratory studies, ordering and review of radiographic studies, pulse oximetry and re-evaluation of patient's condition.    Domenic Moras, PA-C 06/12/17 2120    Domenic Moras, PA-C 06/12/17 2212    Varney Biles, MD 06/13/17 3662

## 2017-06-12 NOTE — ED Triage Notes (Addendum)
Onset 3-4 days bilateral leg and bilateral hand numbness, lower abd pain, and back pain.  Pt reports onset 4-5 days does not urinate as normally did, just urinates several drops.

## 2017-06-12 NOTE — ED Notes (Signed)
IV team at bedside 

## 2017-06-12 NOTE — ED Triage Notes (Signed)
Lab called asking for a recollect on this pt  His pot is high

## 2017-06-12 NOTE — H&P (Signed)
History and Physical    Jonathan Horton [id number] DOB: 09-Aug-1961 DOA: 06/12/2017  Referring MD/NP/PA:   PCP: [name], MD   Patient coming from:  The patient is coming from home.  At baseline, pt is independent for most of ADL.   Chief Complaint: Generalized weakness, decreased urine output, numbness in his hands and legs, mild abdominal pain  HPI: Jonathan Horton is a 56 y.o. male with medical history significant of ESRD on HD (MWF), hypertension, anemia, diabetes mellitus, who presents with gGeneralized weakness, decreased urine output, numbness in both hands and legs, mild abdominal pain.  Pt is a dialysis pt. He has been compliant to dialysis, did not miss any course of dialysis. He states that he still makes urine until 4 days ago, when he started having decreased urine out,  Making Just few drops of urine each day. He does not have dysuria or burning on urination. He has generalized weakness, and numbness in both hands and legs. No facial droop, slurred speech, vision change or hearing loss. He reported o ED physician that he had mild abdominal pain, but denies abdominal pain to me. No nausea, vomiting or diarrhea. He denies chest pain, cough, fever or chills. He states that he has SOB on laying flat in bed sometimes.   ED Course: pt was found to have potassium 7.5 with T wave peaking on EKG, creatinine 18, BUN 107, bicarbonate 18, blood sugar 388, urinalysis with small amount of leukocytes, negative FOBT, lipase 120, LFT normal, negative chest x-ray, no tachycardia, O2 sat are 91-99% on room air, no tachypnea, no fever. Patient is placed on telemetry bed for observation. Renal was consulted for urgent dialysis.  Review of Systems:   General: no fevers, chills, has poor appetite, has fatigue HEENT: no blurry vision, hearing changes or sore throat Respiratory: has dyspnea, no coughing, wheezing CV: no chest pain, no palpitations GI: no nausea, vomiting, had abdominal pain, no  iarrhea, constipation GU: no dysuria, burning on urination, increased urinary frequency, hematuria. Ext: no leg edema Neuro: no unilateral weakness, numbness, or tingling, no vision change or hearing loss Skin: no rash, no skin tear. MSK: No muscle spasm, no deformity, no limitation of range of movement in spin Heme: No easy bruising.  Travel history: No recent long distant travel.  Allergy: No Known Allergies  Past Medical History:  Diagnosis Date  . Anemia   . Chronic kidney disease   . Diabetes mellitus without complication ([place])   . History of blood transfusion 05/2016  . Hypertension     Past Surgical History:  Procedure Laterality Date  . AV FISTULA PLACEMENT Left 06/08/2016   Procedure: ARTERIOVENOUS (AV) FISTULA CREATION;  Surgeon: Angelia Mould, MD;  Location: [place];  Service: Vascular;  Laterality: Left;  . INSERTION OF DIALYSIS CATHETER Right 06/08/2016   Procedure: INSERTION OF DIALYSIS CATHETER;  Surgeon: Angelia Mould, MD;  Location[facility];  Service: Vascular;  Laterality: Right;    Social History:  reports that he has never smoked. He has never used smokeless tobacco. He reports that he does not drink alcohol or use drugs.  Family History:  Family History  Problem Relation Age of Onset  . Colon cancer Neg Hx   . Esophageal cancer Neg Hx   . Rectal cancer Neg Hx   . Stomach cancer Neg Hx      Prior to Admission medications   Medication Sig Start Date End Date Taking? Authorizing Provider  amLODipine (NORVASC) 10 MG tablet Take  10 mg by mouth daily. 05/01/16  Yes [provider]  carvedilol (COREG) 25 MG tablet Take 25 mg by mouth 2 (two) times daily. 05/01/16  Yes [provider]  cloNIDine (CATAPRES) 0.1 MG tablet Take 0.1 mg by mouth daily.  01/03/14  Yes [provider]  multivitamin (RENA-VIT) TABS tablet Take 1 tablet by mouth at bedtime. 06/10/16  Yes Dhungel, Nishant, MD  traMADol (ULTRAM) 50 MG tablet Take 50 mg  by mouth every 6 (six) hours as needed for moderate pain.  01/08/14  Yes [provider]  VELPHORO 500 MG chewable tablet Chew 500 mg by mouth 3 (three) times daily with meals.  11/25/16  Yes [provider]  calcium acetate (PHOSLO) 667 MG capsule Take 1 capsule (667 mg total) by mouth 3 (three) times daily with meals. Patient not taking: Reported on 06/12/2017 06/10/16   Louellen Molder, MD    Physical Exam: Vitals:   06/13/17 0100 06/13/17 0128 06/13/17 0132 06/13/17 0157  BP: 117/62 126/65 121/72 121/66  Pulse: 94 92 90 98  Resp:  14  18  Temp:  98.1 F (36.7 C)  98.5 F (36.9 C)  TempSrc:    Oral  SpO2:    98%  Weight:  78.5 kg (173 lb 1 oz)  77.7 kg (171 lb 6.4 oz)  Height:    5\' 9"  (1.753 m)   General: Not in acute distress HEENT:       Eyes: PERRL, EOMI, no scleral icterus.       ENT: No discharge from the ears and nose, no pharynx injection, no tonsillar enlargement.        Neck: No JVD, no bruit, no mass felt. Heme: No neck lymph node enlargement. Cardiac: S1/S2, RRR, No murmurs, No gallops or rubs. Respiratory: No rales, wheezing, rhonchi or rubs. GI: Soft, nondistended, nontender, no rebound pain, no organomegaly, BS present. GU: No hematuria Ext: No pitting leg edema bilaterally. 2+DP/PT pulse bilaterally. Musculoskeletal: No joint deformities, No joint redness or warmth, no limitation of ROM in spin. Skin: No rashes.  Neuro: Alert, oriented X3, cranial nerves II-XII grossly intact, moves all extremities normally.  Psych: Patient is not psychotic, no suicidal or hemocidal ideation.  Labs on Admission: I have personally reviewed following labs and imaging studies  CBC:  Recent Labs Lab 06/12/17 1615 06/12/17 2106  WBC 7.0  --   HGB 11.9* 10.2*  HCT 35.7* 30.0*  MCV 97.3  --   PLT 271  --    Basic Metabolic Panel:  Recent Labs Lab 06/12/17 1820 06/12/17 2106  NA 123* 129*  K >7.5* 5.8*  CL 88* 102  CO2 18*  --   GLUCOSE 388* 268*    BUN 107* 94*  CREATININE 18.05* 16.20*  CALCIUM 9.6  --    GFR: Estimated Creatinine Clearance: 5.1 mL/min (A) (by C-G formula based on SCr of 16.2 mg/dL (H)). Liver Function Tests:  Recent Labs Lab 06/12/17 1820  AST 20  ALT 17  ALKPHOS 72  BILITOT 0.6  PROT 8.5*  ALBUMIN 3.8    Recent Labs Lab 06/12/17 1820  LIPASE 120*   No results for input(s): AMMONIA in the last 168 hours. Coagulation Profile: No results for input(s): INR, PROTIME in the last 168 hours. Cardiac Enzymes: No results for input(s): CKTOTAL, CKMB, CKMBINDEX, TROPONINI in the last 168 hours. BNP (last 3 results) No results for input(s): PROBNP in the last 8760 hours. HbA1C: No results for input(s): HGBA1C in the last  72 hours. CBG: No results for input(s): GLUCAP in the last 168 hours. Lipid Profile: No results for input(s): CHOL, HDL, LDLCALC, TRIG, CHOLHDL, LDLDIRECT in the last 72 hours. Thyroid Function Tests: No results for input(s): TSH, T4TOTAL, FREET4, T3FREE, THYROIDAB in the last 72 hours. Anemia Panel: No results for input(s): VITAMINB12, FOLATE, FERRITIN, TIBC, IRON, RETICCTPCT in the last 72 hours. Urine analysis:    Component Value Date/Time   COLORURINE YELLOW 09/19/2016 [place] 09/19/2016 1635   LABSPEC 1.007 09/19/2016 1635   PHURINE 7.0 09/19/2016 1635   GLUCOSEU >=500 (A) 09/19/2016 1635   HGBUR NEGATIVE 09/19/2016 1635   BILIRUBINUR NEGATIVE 09/19/2016 1635   KETONESUR NEGATIVE 09/19/2016 1635   PROTEINUR >=300 (A) 09/19/2016 1635   NITRITE NEGATIVE 09/19/2016 1635   LEUKOCYTESUR SMALL (A) 09/19/2016 1635   Sepsis Labs: @LABRCNTIP (procalcitonin:4,lacticidven:4) )No results found for this or any previous visit (from the past 240 hour(s)).   Radiological Exams on Admission: No results found.   EKG: Independently reviewed.  Sinus rhythm, QTC 438, low voltage, LAD, anteroseptal infarction pattern, T-wave peaking.  Assessment/Plan Principal  Problem:   Uremia Active Problems:   Essential hypertension   Diabetes mellitus with end stage renal disease ([place])   ESRD on dialysis (HCC)   Anemia of chronic kidney failure   Hyperkalemia   Abdominal pain   Uremia: Potassium 7.5 with T wave peaking on EKG, creatinine 18, BUN 107. Pt states that he did not miss HD. Likely triggered by decreased urine output. His generalized weakness, numbness in hands and legs are likely due to uremia. No sensory stroke. Renal was consulted for urgent hemodialysis.  -will place on tele bed for obs -urgent HD per renal.  Hyperkalemia: Potassium 7.5 with T wave peaking on EKG. -Patient with NovoLog, calcium and D50 in ED -Urgent dialysis per renal  Essential hypertension:  -IV hydralazine when necessary -Continue Coreg, clonidine, amlodipine  Diabetes mellitus with end stage renal disease ([place]): A1c 5.6 on 06/10/17, well controlled. Pt states that he is taking insulin sometimes, but not know the name of insulin. Blood sugar 388 on admission. -Start Lantus 5 units daily -Sliding scale insulin  ESRD on dialysis ([facility]) -urgent dialysis per renal as above -continue Velphoro and Rena-vit  Anemia of chronic kidney failure: Hemoglobin stable 11.9. Follow-up by CBC  Abdominal pain: Patient reportedly to ED physician that he had a mild abdominal pain, denies abdominal pain to me currently. Lipase is slightly elevated at 120, liver function normal. -When necessary morphine for pain and sinus when necessary Zofran for nausea -Repeat lipase in morning    DVT ppx: SQ Heparin         Code Status: Full code Family Communication: None at bed side.  Disposition Plan:  Anticipate discharge back to previous home environment Consults called:  Renal, dr. Jonnie Finner Admission status: Obs / tele     Date of Service 06/13/2017    Ivor Costa [facility] Pager [phone]  If 7PM-7AM, please contact night-coverage [email or web address] Password [email or web address] 06/13/2017,  2:19 AM

## 2017-06-12 NOTE — Progress Notes (Signed)
Patient arrived to unit per ED stretcher.  Reviewed treatment plan and this RN agrees.  Report received from bedside RN, Cricket.  Consent obtained.  Patient A & O X 4. Lung sounds diminished to ausculation in all fields. No edema. Cardiac: NSR.  Prepped LUAVF with alcohol and cannulated with two 15 gauge needles.  Pulsation of blood noted.  Flushed access well with saline per protocol.  Connected and secured lines and initiated tx at 2228.  UF goal of 3500 mL and net fluid removal of 3000 mL.  Will continue to monitor.

## 2017-06-12 NOTE — ED Notes (Signed)
Pt aware urine sample needed 

## 2017-06-12 NOTE — ED Notes (Signed)
Checked in with pt for urine, none available yet.

## 2017-06-12 NOTE — Consult Note (Addendum)
Renal Service Consult Note [facility]  Jonathan Horton 06/12/2017 [place] Requesting Physician:  DR Kathrynn Humble  Reason for Consult: ESRD pt w hyperkalemia HPI: The patient is a 56 y.o. year-old with hx of HTN, anemia and ESRD on HD MWF . Last HD was Friday, doesn't miss treatments. Presented to ED w/ gen'd muscle weakness (legs/ arms) and K found to be > 7.5.  Pt denies hx of high K or eating high K foods but doesn't really know the high K food list very well. Asked to see for dialysis.   No SOB, no CP, no abd pain, no n/v/d.  Has LUA AVF.  UOP has dropped off the last few wks.    ROS  denies CP  no joint pain   no HA  no blurry vision  no rash  no diarrhea  no nausea/ vomiting   Past Medical History  Past Medical History:  Diagnosis Date  . Anemia   . Chronic kidney disease   . Diabetes mellitus without complication ([place])   . History of blood transfusion 05/2016  . Hypertension    Past Surgical History  Past Surgical History:  Procedure Laterality Date  . AV FISTULA PLACEMENT Left 06/08/2016   Procedure: ARTERIOVENOUS (AV) FISTULA CREATION;  Surgeon: Angelia Mould, MD;  Location: [place];  Service: Vascular;  Laterality: Left;  . INSERTION OF DIALYSIS CATHETER Right 06/08/2016   Procedure: INSERTION OF DIALYSIS CATHETER;  Surgeon: Angelia Mould, MD;  Location: [facility] OR;  Service: Vascular;  Laterality: Right;   Family History  Family History  Problem Relation Age of Onset  . Colon cancer Neg Hx   . Esophageal cancer Neg Hx   . Rectal cancer Neg Hx   . Stomach cancer Neg Hx    Social History  reports that he has never smoked. He has never used smokeless tobacco. He reports that he does not drink alcohol or use drugs. Allergies No Known Allergies Home medications Prior to Admission medications   Medication Sig Start Date End Date Taking? Authorizing Provider  amLODipine (NORVASC) 10 MG tablet Take 10 mg by mouth daily. 05/01/16  Yes  [provider]  carvedilol (COREG) 25 MG tablet Take 25 mg by mouth 2 (two) times daily. 05/01/16  Yes [provider]  cloNIDine (CATAPRES) 0.1 MG tablet Take 0.1 mg by mouth daily.  01/03/14  Yes [provider]  multivitamin (RENA-VIT) TABS tablet Take 1 tablet by mouth at bedtime. 06/10/16  Yes Dhungel, Nishant, MD  traMADol (ULTRAM) 50 MG tablet Take 50 mg by mouth every 6 (six) hours as needed for moderate pain.  01/08/14  Yes [provider]  VELPHORO 500 MG chewable tablet Chew 500 mg by mouth 3 (three) times daily with meals.  11/25/16  Yes [provider]  calcium acetate (PHOSLO) 667 MG capsule Take 1 capsule (667 mg total) by mouth 3 (three) times daily with meals. Patient not taking: Reported on 06/12/2017 06/10/16   Louellen Molder, MD   Liver Function Tests  Recent Labs Lab 06/12/17 1820  AST 20  ALT 17  ALKPHOS 72  BILITOT 0.6  PROT 8.5*  ALBUMIN 3.8    Recent Labs Lab 06/12/17 1820  LIPASE 120*   CBC  Recent Labs Lab 06/12/17 1615  WBC 7.0  HGB 11.9*  HCT 35.7*  MCV 97.3  PLT 824   Basic Metabolic Panel  Recent Labs Lab 06/12/17 1820  NA 123*  K >7.5*  CL 88*  CO2 18*  GLUCOSE 388*  BUN 107*  CREATININE 18.05*  CALCIUM 9.6   Iron/TIBC/Ferritin/ %Sat    Component Value Date/Time   IRON 72 06/08/2016 1707   TIBC 224 (L) 06/08/2016 1707   FERRITIN 206 06/08/2016 1707   IRONPCTSAT 32 06/08/2016 1707    Vitals:   06/12/17 1945 06/12/17 2000 06/12/17 2015 06/12/17 2021  BP: (!) 145/82 (!) 160/82 (!) 168/95   Pulse: 75 74  73  Resp:    18  Temp:      TempSrc:      SpO2: 98% 100%  98%  Weight:      Height:       Exam Gen alert, wdwn, no distress No rash, cyanosis or gangrene Sclera anicteric, throat clear  No jvd or bruits Chest clear bilat RRR no rub, no MG Abd soft ntnd no mass or ascites +bs GU normal male MS no joint effusions or deformity Ext trace LE edema / no wounds or  ulcers Neuro is alert, Ox 3 , nf LUA AVF patent   Dialysis: MWF 4h   77.5kg  2/3.5 bath  Hep 3000   LUA AVF -hect 1 ug    Impression: 1.  Hyperkalemia - severe w/ gen'd musc weakness, but EKG tracing looks good.  Plan urgent HD tonight. Getting acute Rx (ins/ gluc/ bicarb/ Ca++) in ED.   2.  ESRD on HD mwf 3.  Volume - will try UF to dry wt 4.  HTN - cont meds    Plan - as above  Kelly Splinter MD [employer] pager [phone]   06/12/2017, 9:00 PM

## 2017-06-12 NOTE — ED Notes (Signed)
IV attempt x1

## 2017-06-13 ENCOUNTER — Encounter (HOSPITAL_COMMUNITY): Payer: Self-pay

## 2017-06-13 DIAGNOSIS — N19 Unspecified kidney failure: Secondary | ICD-10-CM | POA: Diagnosis not present

## 2017-06-13 DIAGNOSIS — N186 End stage renal disease: Secondary | ICD-10-CM | POA: Diagnosis not present

## 2017-06-13 DIAGNOSIS — E875 Hyperkalemia: Secondary | ICD-10-CM

## 2017-06-13 DIAGNOSIS — E1122 Type 2 diabetes mellitus with diabetic chronic kidney disease: Secondary | ICD-10-CM | POA: Diagnosis not present

## 2017-06-13 LAB — BASIC METABOLIC PANEL
Anion gap: 13 (ref 5–15)
BUN: 53 mg/dL — AB (ref 6–20)
CALCIUM: 9.2 mg/dL (ref 8.9–10.3)
CO2: 29 mmol/L (ref 22–32)
CREATININE: 12.07 mg/dL — AB (ref 0.61–1.24)
Chloride: 90 mmol/L — ABNORMAL LOW (ref 101–111)
GFR calc Af Amer: 5 mL/min — ABNORMAL LOW (ref >60)
GFR, EST NON AFRICAN AMERICAN: 4 mL/min — AB (ref >60)
GLUCOSE: 178 mg/dL — AB (ref 65–99)
Potassium: 4.3 mmol/L (ref 3.5–5.1)
SODIUM: 132 mmol/L — AB (ref 135–145)

## 2017-06-13 LAB — CBC
HEMATOCRIT: 36.2 % — AB (ref 39.0–52.0)
Hemoglobin: 12.1 g/dL — ABNORMAL LOW (ref 13.0–17.0)
MCH: 32.1 pg (ref 26.0–34.0)
MCHC: 33.4 g/dL (ref 30.0–36.0)
MCV: 96 fL (ref 78.0–100.0)
PLATELETS: 271 10*3/uL (ref 150–400)
RBC: 3.77 MIL/uL — ABNORMAL LOW (ref 4.22–5.81)
RDW: 13.8 % (ref 11.5–15.5)
WBC: 8.1 10*3/uL (ref 4.0–10.5)

## 2017-06-13 LAB — COMPREHENSIVE METABOLIC PANEL
ALK PHOS: 72 U/L (ref 38–126)
ALT: 17 U/L (ref 17–63)
AST: 20 U/L (ref 15–41)
Albumin: 3.8 g/dL (ref 3.5–5.0)
Anion gap: 17 — ABNORMAL HIGH (ref 5–15)
BILIRUBIN TOTAL: 0.6 mg/dL (ref 0.3–1.2)
BUN: 107 mg/dL — AB (ref 6–20)
CO2: 18 mmol/L — ABNORMAL LOW (ref 22–32)
Calcium: 9.6 mg/dL (ref 8.9–10.3)
Chloride: 88 mmol/L — ABNORMAL LOW (ref 101–111)
Creatinine, Ser: 18.05 mg/dL — ABNORMAL HIGH (ref 0.61–1.24)
GFR, EST AFRICAN AMERICAN: 3 mL/min — AB (ref >60)
GFR, EST NON AFRICAN AMERICAN: 2 mL/min — AB (ref >60)
Glucose, Bld: 388 mg/dL — ABNORMAL HIGH (ref 65–99)
Potassium: >7.5 mmol/L (ref 3.5–5.1)
Sodium: 123 mmol/L — ABNORMAL LOW (ref 135–145)
Total Protein: 8.5 g/dL — ABNORMAL HIGH (ref 6.5–8.1)

## 2017-06-13 LAB — HIV ANTIBODY (ROUTINE TESTING W REFLEX): HIV SCREEN 4TH GENERATION: NONREACTIVE

## 2017-06-13 LAB — LIPASE, BLOOD: LIPASE: 112 U/L — AB (ref 11–51)

## 2017-06-13 LAB — GLUCOSE, CAPILLARY
GLUCOSE-CAPILLARY: 153 mg/dL — AB (ref 65–99)
Glucose-Capillary: 160 mg/dL — ABNORMAL HIGH (ref 65–99)
Glucose-Capillary: 211 mg/dL — ABNORMAL HIGH (ref 65–99)
Glucose-Capillary: 268 mg/dL — ABNORMAL HIGH (ref 65–99)

## 2017-06-13 MED ORDER — DOXERCALCIFEROL 4 MCG/2ML IV SOLN
1.0000 ug | INTRAVENOUS | Status: DC
  Filled 2017-06-13: qty 2

## 2017-06-13 MED ORDER — PNEUMOCOCCAL VAC POLYVALENT 25 MCG/0.5ML IJ INJ
0.5000 mL | INJECTION | INTRAMUSCULAR | Status: AC
  Administered 2017-06-13: 0.5 mL via INTRAMUSCULAR
  Filled 2017-06-13: qty 0.5

## 2017-06-13 MED ORDER — MORPHINE SULFATE (PF) 2 MG/ML IV SOLN: 2.0000 mg | INTRAVENOUS | Status: DC | PRN

## 2017-06-13 MED ORDER — PNEUMOCOCCAL VAC POLYVALENT 25 MCG/0.5ML IJ INJ
0.5000 mL | INJECTION | INTRAMUSCULAR | Status: DC
  Filled 2017-06-13: qty 0.5

## 2017-06-13 NOTE — Progress Notes (Signed)
Dialysis treatment completed.  2000 mL ultrafiltrated and net fluid removal 1500 mL.    Patient status unchanged. Lung sounds diminshed to ausculation in all fields. No edema. Cardiac: NSR.  Disconnected lines and removed needles.  Pressure held for 10 minutes and band aid/gauze dressing applied.  Report given to bedside RN, Albertina Parr.

## 2017-06-13 NOTE — Progress Notes (Signed)
Patient discharged with friend who is taking him to [facility] for fistulogram at 1pm today. VSS. AVS reviewed with patient. All belongings sent with patient. Blood pressure (!) 110/52, pulse 94, temperature 98.5 F (36.9 C), temperature source Oral, resp. rate 16, height 5\' 9"  (1.753 m), weight 77.7 kg (171 lb 6.4 oz), SpO2 96 %.

## 2017-06-13 NOTE — Progress Notes (Signed)
Admitted overnight due to ^ K > 7.5 BUN 107 Cr 18.  Compliant with dialysis.  Likely cause is AVF malfunction - last had intervention with stent 03/2017.  Have made arrangements for him to have a fistulagram today at [facility] at 1 pm today.  Discussed with patient who will try to get transportation. Plan to keep pt NPO.  Cleared from renal standpoint for discharge.  K was 4.3 three  hours post HD.  Amalia Hailey, PA-C

## 2017-06-13 NOTE — Discharge Summary (Signed)
Physician Discharge Summary  Romney Compean VPX:[id number] DOB: 1961-01-08 DOA: 06/12/2017  PCP: [name], MD  Admit date: 06/12/2017 Discharge date: 06/13/2017   Recommendations for Outpatient Follow-Up:   1. Patient to have fistulogram today   Discharge Diagnosis:   Principal Problem:   Uremia Active Problems:   Essential hypertension   Diabetes mellitus with end stage renal disease ([place])   ESRD on dialysis ([place])   Anemia of chronic kidney failure   Hyperkalemia   Abdominal pain   Discharge disposition:  Home.  Discharge Condition: Improved.  Diet recommendation: renal/carbs  Wound care: None.   History of Present Illness:   Jonathan Horton is a 56 y.o. male with medical history significant of ESRD on HD (MWF), hypertension, anemia, diabetes mellitus, who presents with gGeneralized weakness, decreased urine output, numbness in both hands and legs, mild abdominal pain.  Pt is a dialysis pt. He has been compliant to dialysis, did not miss any course of dialysis. He states that he still makes urine until 4 days ago, when he started having decreased urine out,  Making Just few drops of urine each day. He does not have dysuria or burning on urination. He has generalized weakness, and numbness in both hands and legs. No facial droop, slurred speech, vision change or hearing loss. He reported o ED physician that he had mild abdominal pain, but denies abdominal pain to me. No nausea, vomiting or diarrhea. He denies chest pain, cough, fever or chills. He states that he has SOB on laying flat in bed sometimes.    Hospital Course by Problem:   Hyperkalemia- due to AVF malfunction -s/p HD with resolution -per nephrology patient to go to [facility] for fistulogram at 1 pm today    Medical Consultants:    renal   Discharge Exam:   Vitals:   06/13/17 0809 06/13/17 0844  BP: (!) 91/58 (!) 114/56  Pulse: (!) 101 (!) 102  Resp: 18   Temp: 98.6 F (37 C)   SpO2: 94%  96%   Vitals:   06/13/17 0157 06/13/17 0521 06/13/17 0809 06/13/17 0844  BP: 121/66 102/64 (!) 91/58 (!) 114/56  Pulse: 98 99 (!) 101 (!) 102  Resp: 18 18 18    Temp: 98.5 F (36.9 C) 98.6 F (37 C) 98.6 F (37 C)   TempSrc: Oral Oral Oral   SpO2: 98% 98% 94% 96%  Weight: 77.7 kg (171 lb 6.4 oz)     Height: 5\' 9"  (1.753 m)       Gen:  NAD   The results of significant diagnostics from this hospitalization (including imaging, microbiology, ancillary and laboratory) are listed below for reference.     Procedures and Diagnostic Studies:   No results found.   Labs:   Basic Metabolic Panel:  Recent Labs Lab 06/12/17 1820 06/12/17 2106 06/13/17 0434  NA 123* 129* 132*  K >7.5* 5.8* 4.3  CL 88* 102 90*  CO2 18*  --  29  GLUCOSE 388* 268* 178*  BUN 107* 94* 53*  CREATININE 18.05* 16.20* 12.07*  CALCIUM 9.6  --  9.2   GFR Estimated Creatinine Clearance: 6.8 mL/min (A) (by C-G formula based on SCr of 12.07 mg/dL (H)). Liver Function Tests:  Recent Labs Lab 06/12/17 1820  AST 20  ALT 17  ALKPHOS 72  BILITOT 0.6  PROT 8.5*  ALBUMIN 3.8    Recent Labs Lab 06/12/17 1820 06/13/17 0434  LIPASE 120* 112*   No results for input(s): AMMONIA in  the last 168 hours. Coagulation profile No results for input(s): INR, PROTIME in the last 168 hours.  CBC:  Recent Labs Lab 06/12/17 1615 06/12/17 2106 06/13/17 0434  WBC 7.0  --  8.1  HGB 11.9* 10.2* 12.1*  HCT 35.7* 30.0* 36.2*  MCV 97.3  --  96.0  PLT 271  --  271   Cardiac Enzymes: No results for input(s): CKTOTAL, CKMB, CKMBINDEX, TROPONINI in the last 168 hours. BNP: Invalid input(s): POCBNP CBG:  Recent Labs Lab 06/13/17 0235 06/13/17 0740  GLUCAP 153* 211*   D-Dimer No results for input(s): DDIMER in the last 72 hours. Hgb A1c No results for input(s): HGBA1C in the last 72 hours. Lipid Profile No results for input(s): CHOL, HDL, LDLCALC, TRIG, CHOLHDL, LDLDIRECT in the last 72  hours. Thyroid function studies No results for input(s): TSH, T4TOTAL, T3FREE, THYROIDAB in the last 72 hours.  Invalid input(s): FREET3 Anemia work up No results for input(s): VITAMINB12, FOLATE, FERRITIN, TIBC, IRON, RETICCTPCT in the last 72 hours. Microbiology No results found for this or any previous visit (from the past 240 hour(s)).   Discharge Instructions:   Discharge Instructions    Discharge instructions    Complete by:  As directed    Renal/carb mod diet AFTER PROCEDURE-- NPO until fistulogram Resume home insulin-- was not on our list here   Increase activity slowly    Complete by:  As directed      Allergies as of 06/13/2017   No Known Allergies     Medication List    STOP taking these medications   calcium acetate 667 MG capsule Commonly known as:  PHOSLO     TAKE these medications   amLODipine 10 MG tablet Commonly known as:  NORVASC Take 10 mg by mouth daily.   carvedilol 25 MG tablet Commonly known as:  COREG Take 25 mg by mouth 2 (two) times daily.   cloNIDine 0.1 MG tablet Commonly known as:  CATAPRES Take 0.1 mg by mouth daily.   multivitamin Tabs tablet Take 1 tablet by mouth at bedtime.   traMADol 50 MG tablet Commonly known as:  ULTRAM Take 50 mg by mouth every 6 (six) hours as needed for moderate pain.   VELPHORO 500 MG chewable tablet Generic drug:  sucroferric oxyhydroxide Chew 500 mg by mouth 3 (three) times daily with meals.            Discharge Care Instructions        Start     Ordered   06/13/17 0000  Increase activity slowly     06/13/17 0933   06/13/17 0000  Discharge instructions    Comments:  Renal/carb mod diet AFTER PROCEDURE-- NPO until fistulogram Resume home insulin-- was not on our list here   06/13/17 4431     Follow-up Information    [name], MD Follow up in 1 week(s).   Specialty:  Internal Medicine Contact information: [place] [address] [phone]        got  to [facility] at 1PM for fistulogram Follow up.            Time coordinating discharge: 35 min  Signed:  [place] Duan U Zeric Baranowski   [facility] 06/13/2017, 9:34 AM

## 2017-06-13 NOTE — Progress Notes (Signed)
Patient arrived to 3E21 from dialysis/[facility]. A&O x 4. No complaints of pain. VSS. SR on telemetry. Low fall risk. Patient oriented to room and call system.

## 2017-06-13 NOTE — Procedures (Signed)
   I was present at this dialysis session, have reviewed the session itself and made  appropriate changes Kelly Splinter MD [place] pager [phone]   06/13/2017, 12:13 AM

## 2017-06-14 DIAGNOSIS — I871 Compression of vein: Secondary | ICD-10-CM | POA: Diagnosis not present

## 2017-06-14 DIAGNOSIS — Z992 Dependence on renal dialysis: Secondary | ICD-10-CM | POA: Diagnosis not present

## 2017-06-14 DIAGNOSIS — T82858A Stenosis of vascular prosthetic devices, implants and grafts, initial encounter: Secondary | ICD-10-CM | POA: Diagnosis not present

## 2017-06-14 DIAGNOSIS — N186 End stage renal disease: Secondary | ICD-10-CM | POA: Diagnosis not present

## 2017-06-15 DIAGNOSIS — N2581 Secondary hyperparathyroidism of renal origin: Secondary | ICD-10-CM | POA: Diagnosis not present

## 2017-06-15 DIAGNOSIS — N186 End stage renal disease: Secondary | ICD-10-CM | POA: Diagnosis not present

## 2017-06-16 DIAGNOSIS — Z125 Encounter for screening for malignant neoplasm of prostate: Secondary | ICD-10-CM | POA: Diagnosis not present

## 2017-06-16 DIAGNOSIS — I739 Peripheral vascular disease, unspecified: Secondary | ICD-10-CM | POA: Diagnosis not present

## 2017-06-16 DIAGNOSIS — N186 End stage renal disease: Secondary | ICD-10-CM | POA: Diagnosis not present

## 2017-06-16 DIAGNOSIS — I10 Essential (primary) hypertension: Secondary | ICD-10-CM | POA: Diagnosis not present

## 2017-06-16 DIAGNOSIS — E1121 Type 2 diabetes mellitus with diabetic nephropathy: Secondary | ICD-10-CM | POA: Diagnosis not present

## 2017-06-17 DIAGNOSIS — N186 End stage renal disease: Secondary | ICD-10-CM | POA: Diagnosis not present

## 2017-06-17 DIAGNOSIS — N2581 Secondary hyperparathyroidism of renal origin: Secondary | ICD-10-CM | POA: Diagnosis not present

## 2017-06-19 DIAGNOSIS — Z992 Dependence on renal dialysis: Secondary | ICD-10-CM | POA: Diagnosis not present

## 2017-06-19 DIAGNOSIS — N186 End stage renal disease: Secondary | ICD-10-CM | POA: Diagnosis not present

## 2017-06-19 DIAGNOSIS — I129 Hypertensive chronic kidney disease with stage 1 through stage 4 chronic kidney disease, or unspecified chronic kidney disease: Secondary | ICD-10-CM | POA: Diagnosis not present

## 2017-06-20 DIAGNOSIS — N186 End stage renal disease: Secondary | ICD-10-CM | POA: Diagnosis not present

## 2017-06-20 DIAGNOSIS — N2581 Secondary hyperparathyroidism of renal origin: Secondary | ICD-10-CM | POA: Diagnosis not present

## 2017-06-22 DIAGNOSIS — N2581 Secondary hyperparathyroidism of renal origin: Secondary | ICD-10-CM | POA: Diagnosis not present

## 2017-06-22 DIAGNOSIS — N186 End stage renal disease: Secondary | ICD-10-CM | POA: Diagnosis not present

## 2017-06-24 DIAGNOSIS — N2581 Secondary hyperparathyroidism of renal origin: Secondary | ICD-10-CM | POA: Diagnosis not present

## 2017-06-24 DIAGNOSIS — N186 End stage renal disease: Secondary | ICD-10-CM | POA: Diagnosis not present

## 2017-06-27 DIAGNOSIS — N186 End stage renal disease: Secondary | ICD-10-CM | POA: Diagnosis not present

## 2017-06-27 DIAGNOSIS — N2581 Secondary hyperparathyroidism of renal origin: Secondary | ICD-10-CM | POA: Diagnosis not present

## 2017-06-29 DIAGNOSIS — N186 End stage renal disease: Secondary | ICD-10-CM | POA: Diagnosis not present

## 2017-06-29 DIAGNOSIS — N2581 Secondary hyperparathyroidism of renal origin: Secondary | ICD-10-CM | POA: Diagnosis not present

## 2017-06-30 ENCOUNTER — Ambulatory Visit (HOSPITAL_COMMUNITY)
Admission: RE | Admit: 2017-06-30 | Discharge: 2017-06-30 | Disposition: A | Discharge status: Home / Self Care | Payer: BLUE CROSS/BLUE SHIELD | Source: Ambulatory Visit | Attending: Vascular Surgery | Admitting: Vascular Surgery

## 2017-06-30 ENCOUNTER — Other Ambulatory Visit (HOSPITAL_COMMUNITY): Payer: Self-pay | Admitting: Internal Medicine

## 2017-06-30 DIAGNOSIS — I739 Peripheral vascular disease, unspecified: Secondary | ICD-10-CM

## 2017-06-30 DIAGNOSIS — I10 Essential (primary) hypertension: Secondary | ICD-10-CM | POA: Diagnosis not present

## 2017-06-30 DIAGNOSIS — R9389 Abnormal findings on diagnostic imaging of other specified body structures: Secondary | ICD-10-CM | POA: Insufficient documentation

## 2017-06-30 DIAGNOSIS — E1151 Type 2 diabetes mellitus with diabetic peripheral angiopathy without gangrene: Secondary | ICD-10-CM | POA: Insufficient documentation

## 2017-07-01 DIAGNOSIS — N2581 Secondary hyperparathyroidism of renal origin: Secondary | ICD-10-CM | POA: Diagnosis not present

## 2017-07-01 DIAGNOSIS — N186 End stage renal disease: Secondary | ICD-10-CM | POA: Diagnosis not present

## 2017-07-04 DIAGNOSIS — N2581 Secondary hyperparathyroidism of renal origin: Secondary | ICD-10-CM | POA: Diagnosis not present

## 2017-07-04 DIAGNOSIS — N186 End stage renal disease: Secondary | ICD-10-CM | POA: Diagnosis not present

## 2017-07-06 DIAGNOSIS — N2581 Secondary hyperparathyroidism of renal origin: Secondary | ICD-10-CM | POA: Diagnosis not present

## 2017-07-06 DIAGNOSIS — N186 End stage renal disease: Secondary | ICD-10-CM | POA: Diagnosis not present

## 2017-07-08 DIAGNOSIS — N186 End stage renal disease: Secondary | ICD-10-CM | POA: Diagnosis not present

## 2017-07-08 DIAGNOSIS — N2581 Secondary hyperparathyroidism of renal origin: Secondary | ICD-10-CM | POA: Diagnosis not present

## 2017-07-11 DIAGNOSIS — N2581 Secondary hyperparathyroidism of renal origin: Secondary | ICD-10-CM | POA: Diagnosis not present

## 2017-07-11 DIAGNOSIS — N186 End stage renal disease: Secondary | ICD-10-CM | POA: Diagnosis not present

## 2017-07-13 DIAGNOSIS — N2581 Secondary hyperparathyroidism of renal origin: Secondary | ICD-10-CM | POA: Diagnosis not present

## 2017-07-13 DIAGNOSIS — N186 End stage renal disease: Secondary | ICD-10-CM | POA: Diagnosis not present

## 2017-07-15 DIAGNOSIS — N186 End stage renal disease: Secondary | ICD-10-CM | POA: Diagnosis not present

## 2017-07-15 DIAGNOSIS — N2581 Secondary hyperparathyroidism of renal origin: Secondary | ICD-10-CM | POA: Diagnosis not present

## 2017-07-18 DIAGNOSIS — N186 End stage renal disease: Secondary | ICD-10-CM | POA: Diagnosis not present

## 2017-07-18 DIAGNOSIS — N2581 Secondary hyperparathyroidism of renal origin: Secondary | ICD-10-CM | POA: Diagnosis not present

## 2017-07-20 DIAGNOSIS — N186 End stage renal disease: Secondary | ICD-10-CM | POA: Diagnosis not present

## 2017-07-20 DIAGNOSIS — N2581 Secondary hyperparathyroidism of renal origin: Secondary | ICD-10-CM | POA: Diagnosis not present

## 2017-07-22 DIAGNOSIS — N186 End stage renal disease: Secondary | ICD-10-CM | POA: Diagnosis not present

## 2017-07-22 DIAGNOSIS — N2581 Secondary hyperparathyroidism of renal origin: Secondary | ICD-10-CM | POA: Diagnosis not present

## 2017-07-25 ENCOUNTER — Ambulatory Visit (INDEPENDENT_AMBULATORY_CARE_PROVIDER_SITE_OTHER): Payer: Medicare Other

## 2017-07-25 ENCOUNTER — Ambulatory Visit (HOSPITAL_COMMUNITY)
Admission: EM | Admit: 2017-07-25 | Discharge: 2017-07-25 | Disposition: A | Discharge status: Home / Self Care | Payer: Medicare Other | Attending: Family Medicine | Admitting: Family Medicine

## 2017-07-25 ENCOUNTER — Encounter (HOSPITAL_COMMUNITY): Payer: Self-pay | Admitting: Emergency Medicine

## 2017-07-25 DIAGNOSIS — N2581 Secondary hyperparathyroidism of renal origin: Secondary | ICD-10-CM | POA: Diagnosis not present

## 2017-07-25 DIAGNOSIS — S93491A Sprain of other ligament of right ankle, initial encounter: Secondary | ICD-10-CM

## 2017-07-25 DIAGNOSIS — M25571 Pain in right ankle and joints of right foot: Secondary | ICD-10-CM

## 2017-07-25 DIAGNOSIS — S99911A Unspecified injury of right ankle, initial encounter: Secondary | ICD-10-CM | POA: Diagnosis not present

## 2017-07-25 DIAGNOSIS — N186 End stage renal disease: Secondary | ICD-10-CM | POA: Diagnosis not present

## 2017-07-25 NOTE — ED Triage Notes (Signed)
Pt states he was walking yesterday evening and misstepped and twisted his R ankle, pt c/o R ankle pain.

## 2017-07-25 NOTE — ED Provider Notes (Signed)
  [place]   [id number] 07/25/17 Arrival Time: [place]:  1. Sprain of anterior talofibular ligament of right ankle, initial encounter    No fracture seen on x-rays today. ASO applied. He prefers crutches until he can bear weight (as tolerated). Will f/u within one week if not showing improvement. OTC Tylenol as needed.  Reviewed expectations re: course of current medical issues. Questions answered. Outlined signs and symptoms indicating need for more acute intervention. Patient verbalized understanding. After Visit Summary given.   SUBJECTIVE:  Jonathan Horton is a 56 y.o. male who reports:  Ankle Pain: Patient complains of right ankle pain.  Onset of the symptoms was yesterday. Inciting event: inverted while walking. Current symptoms include ability to bear weight, but with some pain.  Aggravating symptoms: walking. Patient's overall course: stable. Patient has had no prior ankle problems. Previous visits for this problem: none.  Evaluation to date: none.  Treatment to date: none. Mild swelling reported. No extremity sensation changes or weakness. ROS: As per HPI.   OBJECTIVE:  Vitals:   07/25/17 1225  BP: 95/62  Pulse: 98  Resp: 18  Temp: 98.7 F (37.1 C)  SpO2: 100%    General appearance: alert; no distress Extremities: no cyanosis or edema; symmetrical with no gross deformities; tenderness over his left lateral ankle with mild swelling and no bruising; FROM of ankle; no instability CV: normal extremity capillary refill Skin: warm and dry Neurologic: normal gait; normal symmetric reflexes in all extremities; normal sensation Psychological: alert and cooperative; normal mood and affect  Imaging: Dg Ankle Complete Right  Result Date: 07/25/2017 CLINICAL DATA:  Recent trip and fall with ankle pain, initial encounter EXAM: RIGHT ANKLE - COMPLETE 3+ VIEW COMPARISON:  None. FINDINGS: No acute fracture or dislocation is noted. Vascular  calcifications are seen. No significant soft tissue abnormality is noted. Mild tarsal degenerative changes are seen. IMPRESSION: No acute abnormality identified. Electronically Signed   By: Inez Catalina M.D.   On: 07/25/2017 12:54    No Known Allergies  Past Medical History:  Diagnosis Date  . Anemia   . Chronic kidney disease   . Diabetes mellitus without complication ([place])   . History of blood transfusion 05/2016  . Hypertension    Social History   Socioeconomic History  . Marital status: Married    Spouse name: Not on file  . Number of children: Not on file  . Years of education: Not on file  . Highest education level: Not on file  Social Needs  . Financial resource strain: Not on file  . Food insecurity - worry: Not on file  . Food insecurity - inability: Not on file  . Transportation needs - medical: Not on file  . Transportation needs - non-medical: Not on file  Occupational History  . Not on file  Tobacco Use  . Smoking status: Never Smoker  . Smokeless tobacco: Never Used  Substance and Sexual Activity  . Alcohol use: No  . Drug use: No  . Sexual activity: Not on file  Other Topics Concern  . Not on file  Social History Narrative  . Not on file   Family History  Problem Relation Age of Onset  . Colon cancer Neg Hx   . Esophageal cancer Neg Hx   . Rectal cancer Neg Hx   . Stomach cancer Neg Hx    History reviewed. No pertinent surgical history.   Vanessa Kick, MD 07/27/17 [phone]

## 2017-07-25 NOTE — Discharge Instructions (Signed)
Follow up in one week if not improving.  You may use over the counter acetaminophen (Tylenol) as needed for pain.

## 2017-07-27 DIAGNOSIS — N186 End stage renal disease: Secondary | ICD-10-CM | POA: Diagnosis not present

## 2017-07-27 DIAGNOSIS — N2581 Secondary hyperparathyroidism of renal origin: Secondary | ICD-10-CM | POA: Diagnosis not present

## 2017-07-28 DIAGNOSIS — I10 Essential (primary) hypertension: Secondary | ICD-10-CM | POA: Diagnosis not present

## 2017-07-28 DIAGNOSIS — E1121 Type 2 diabetes mellitus with diabetic nephropathy: Secondary | ICD-10-CM | POA: Diagnosis not present

## 2017-07-28 DIAGNOSIS — N186 End stage renal disease: Secondary | ICD-10-CM | POA: Diagnosis not present

## 2017-07-28 DIAGNOSIS — S93409A Sprain of unspecified ligament of unspecified ankle, initial encounter: Secondary | ICD-10-CM | POA: Diagnosis not present

## 2017-07-29 DIAGNOSIS — N2581 Secondary hyperparathyroidism of renal origin: Secondary | ICD-10-CM | POA: Diagnosis not present

## 2017-07-29 DIAGNOSIS — N186 End stage renal disease: Secondary | ICD-10-CM | POA: Diagnosis not present

## 2017-08-01 DIAGNOSIS — N186 End stage renal disease: Secondary | ICD-10-CM | POA: Diagnosis not present

## 2017-08-01 DIAGNOSIS — N2581 Secondary hyperparathyroidism of renal origin: Secondary | ICD-10-CM | POA: Diagnosis not present

## 2017-08-03 DIAGNOSIS — N2581 Secondary hyperparathyroidism of renal origin: Secondary | ICD-10-CM | POA: Diagnosis not present

## 2017-08-03 DIAGNOSIS — N186 End stage renal disease: Secondary | ICD-10-CM | POA: Diagnosis not present

## 2017-08-05 DIAGNOSIS — N2581 Secondary hyperparathyroidism of renal origin: Secondary | ICD-10-CM | POA: Diagnosis not present

## 2017-08-05 DIAGNOSIS — N186 End stage renal disease: Secondary | ICD-10-CM | POA: Diagnosis not present

## 2017-08-07 DIAGNOSIS — N2581 Secondary hyperparathyroidism of renal origin: Secondary | ICD-10-CM | POA: Diagnosis not present

## 2017-08-07 DIAGNOSIS — N186 End stage renal disease: Secondary | ICD-10-CM | POA: Diagnosis not present

## 2017-08-09 DIAGNOSIS — N2581 Secondary hyperparathyroidism of renal origin: Secondary | ICD-10-CM | POA: Diagnosis not present

## 2017-08-09 DIAGNOSIS — N186 End stage renal disease: Secondary | ICD-10-CM | POA: Diagnosis not present

## 2017-08-12 DIAGNOSIS — N2581 Secondary hyperparathyroidism of renal origin: Secondary | ICD-10-CM | POA: Diagnosis not present

## 2017-08-12 DIAGNOSIS — N186 End stage renal disease: Secondary | ICD-10-CM | POA: Diagnosis not present

## 2017-08-15 DIAGNOSIS — N186 End stage renal disease: Secondary | ICD-10-CM | POA: Diagnosis not present

## 2017-08-15 DIAGNOSIS — N2581 Secondary hyperparathyroidism of renal origin: Secondary | ICD-10-CM | POA: Diagnosis not present

## 2017-08-17 DIAGNOSIS — N2581 Secondary hyperparathyroidism of renal origin: Secondary | ICD-10-CM | POA: Diagnosis not present

## 2017-08-17 DIAGNOSIS — N186 End stage renal disease: Secondary | ICD-10-CM | POA: Diagnosis not present

## 2017-08-19 DIAGNOSIS — N186 End stage renal disease: Secondary | ICD-10-CM | POA: Diagnosis not present

## 2017-08-19 DIAGNOSIS — I129 Hypertensive chronic kidney disease with stage 1 through stage 4 chronic kidney disease, or unspecified chronic kidney disease: Secondary | ICD-10-CM | POA: Diagnosis not present

## 2017-08-19 DIAGNOSIS — Z992 Dependence on renal dialysis: Secondary | ICD-10-CM | POA: Diagnosis not present

## 2017-08-19 DIAGNOSIS — N2581 Secondary hyperparathyroidism of renal origin: Secondary | ICD-10-CM | POA: Diagnosis not present

## 2017-08-22 DIAGNOSIS — N2581 Secondary hyperparathyroidism of renal origin: Secondary | ICD-10-CM | POA: Diagnosis not present

## 2017-08-22 DIAGNOSIS — N186 End stage renal disease: Secondary | ICD-10-CM | POA: Diagnosis not present

## 2017-08-24 DIAGNOSIS — N186 End stage renal disease: Secondary | ICD-10-CM | POA: Diagnosis not present

## 2017-08-24 DIAGNOSIS — N2581 Secondary hyperparathyroidism of renal origin: Secondary | ICD-10-CM | POA: Diagnosis not present

## 2017-08-26 DIAGNOSIS — N2581 Secondary hyperparathyroidism of renal origin: Secondary | ICD-10-CM | POA: Diagnosis not present

## 2017-08-26 DIAGNOSIS — N186 End stage renal disease: Secondary | ICD-10-CM | POA: Diagnosis not present

## 2017-08-29 DIAGNOSIS — N2581 Secondary hyperparathyroidism of renal origin: Secondary | ICD-10-CM | POA: Diagnosis not present

## 2017-08-29 DIAGNOSIS — N186 End stage renal disease: Secondary | ICD-10-CM | POA: Diagnosis not present

## 2017-08-31 DIAGNOSIS — N2581 Secondary hyperparathyroidism of renal origin: Secondary | ICD-10-CM | POA: Diagnosis not present

## 2017-08-31 DIAGNOSIS — N186 End stage renal disease: Secondary | ICD-10-CM | POA: Diagnosis not present

## 2017-09-02 DIAGNOSIS — N2581 Secondary hyperparathyroidism of renal origin: Secondary | ICD-10-CM | POA: Diagnosis not present

## 2017-09-02 DIAGNOSIS — N186 End stage renal disease: Secondary | ICD-10-CM | POA: Diagnosis not present

## 2017-09-05 DIAGNOSIS — N2581 Secondary hyperparathyroidism of renal origin: Secondary | ICD-10-CM | POA: Diagnosis not present

## 2017-09-05 DIAGNOSIS — N186 End stage renal disease: Secondary | ICD-10-CM | POA: Diagnosis not present

## 2017-09-07 DIAGNOSIS — N2581 Secondary hyperparathyroidism of renal origin: Secondary | ICD-10-CM | POA: Diagnosis not present

## 2017-09-07 DIAGNOSIS — N186 End stage renal disease: Secondary | ICD-10-CM | POA: Diagnosis not present

## 2017-09-08 DIAGNOSIS — B353 Tinea pedis: Secondary | ICD-10-CM | POA: Diagnosis not present

## 2017-09-08 DIAGNOSIS — I10 Essential (primary) hypertension: Secondary | ICD-10-CM | POA: Diagnosis not present

## 2017-09-08 DIAGNOSIS — N186 End stage renal disease: Secondary | ICD-10-CM | POA: Diagnosis not present

## 2017-09-08 DIAGNOSIS — E1121 Type 2 diabetes mellitus with diabetic nephropathy: Secondary | ICD-10-CM | POA: Diagnosis not present

## 2017-09-09 DIAGNOSIS — N2581 Secondary hyperparathyroidism of renal origin: Secondary | ICD-10-CM | POA: Diagnosis not present

## 2017-09-09 DIAGNOSIS — N186 End stage renal disease: Secondary | ICD-10-CM | POA: Diagnosis not present

## 2017-09-12 DIAGNOSIS — N186 End stage renal disease: Secondary | ICD-10-CM | POA: Diagnosis not present

## 2017-09-12 DIAGNOSIS — N2581 Secondary hyperparathyroidism of renal origin: Secondary | ICD-10-CM | POA: Diagnosis not present

## 2017-09-14 DIAGNOSIS — N2581 Secondary hyperparathyroidism of renal origin: Secondary | ICD-10-CM | POA: Diagnosis not present

## 2017-09-14 DIAGNOSIS — N186 End stage renal disease: Secondary | ICD-10-CM | POA: Diagnosis not present

## 2017-09-16 DIAGNOSIS — N186 End stage renal disease: Secondary | ICD-10-CM | POA: Diagnosis not present

## 2017-09-16 DIAGNOSIS — N2581 Secondary hyperparathyroidism of renal origin: Secondary | ICD-10-CM | POA: Diagnosis not present

## 2017-09-18 DIAGNOSIS — N2581 Secondary hyperparathyroidism of renal origin: Secondary | ICD-10-CM | POA: Diagnosis not present

## 2017-09-18 DIAGNOSIS — N186 End stage renal disease: Secondary | ICD-10-CM | POA: Diagnosis not present

## 2017-09-19 DIAGNOSIS — N186 End stage renal disease: Secondary | ICD-10-CM | POA: Diagnosis not present

## 2017-09-19 DIAGNOSIS — I129 Hypertensive chronic kidney disease with stage 1 through stage 4 chronic kidney disease, or unspecified chronic kidney disease: Secondary | ICD-10-CM | POA: Diagnosis not present

## 2017-09-19 DIAGNOSIS — Z992 Dependence on renal dialysis: Secondary | ICD-10-CM | POA: Diagnosis not present

## 2017-09-21 DIAGNOSIS — N186 End stage renal disease: Secondary | ICD-10-CM | POA: Diagnosis not present

## 2017-09-21 DIAGNOSIS — N2581 Secondary hyperparathyroidism of renal origin: Secondary | ICD-10-CM | POA: Diagnosis not present

## 2017-09-23 DIAGNOSIS — N2581 Secondary hyperparathyroidism of renal origin: Secondary | ICD-10-CM | POA: Diagnosis not present

## 2017-09-23 DIAGNOSIS — N186 End stage renal disease: Secondary | ICD-10-CM | POA: Diagnosis not present

## 2017-09-26 DIAGNOSIS — N2581 Secondary hyperparathyroidism of renal origin: Secondary | ICD-10-CM | POA: Diagnosis not present

## 2017-09-26 DIAGNOSIS — N186 End stage renal disease: Secondary | ICD-10-CM | POA: Diagnosis not present

## 2017-09-28 DIAGNOSIS — N186 End stage renal disease: Secondary | ICD-10-CM | POA: Diagnosis not present

## 2017-09-28 DIAGNOSIS — N2581 Secondary hyperparathyroidism of renal origin: Secondary | ICD-10-CM | POA: Diagnosis not present

## 2017-09-30 DIAGNOSIS — N2581 Secondary hyperparathyroidism of renal origin: Secondary | ICD-10-CM | POA: Diagnosis not present

## 2017-09-30 DIAGNOSIS — N186 End stage renal disease: Secondary | ICD-10-CM | POA: Diagnosis not present

## 2017-10-03 DIAGNOSIS — N2581 Secondary hyperparathyroidism of renal origin: Secondary | ICD-10-CM | POA: Diagnosis not present

## 2017-10-03 DIAGNOSIS — N186 End stage renal disease: Secondary | ICD-10-CM | POA: Diagnosis not present

## 2017-10-05 DIAGNOSIS — N186 End stage renal disease: Secondary | ICD-10-CM | POA: Diagnosis not present

## 2017-10-05 DIAGNOSIS — N2581 Secondary hyperparathyroidism of renal origin: Secondary | ICD-10-CM | POA: Diagnosis not present

## 2017-10-07 DIAGNOSIS — N2581 Secondary hyperparathyroidism of renal origin: Secondary | ICD-10-CM | POA: Diagnosis not present

## 2017-10-07 DIAGNOSIS — N186 End stage renal disease: Secondary | ICD-10-CM | POA: Diagnosis not present

## 2017-10-10 DIAGNOSIS — N186 End stage renal disease: Secondary | ICD-10-CM | POA: Diagnosis not present

## 2017-10-10 DIAGNOSIS — N2581 Secondary hyperparathyroidism of renal origin: Secondary | ICD-10-CM | POA: Diagnosis not present

## 2017-10-12 DIAGNOSIS — N2581 Secondary hyperparathyroidism of renal origin: Secondary | ICD-10-CM | POA: Diagnosis not present

## 2017-10-12 DIAGNOSIS — N186 End stage renal disease: Secondary | ICD-10-CM | POA: Diagnosis not present

## 2017-10-14 DIAGNOSIS — N2581 Secondary hyperparathyroidism of renal origin: Secondary | ICD-10-CM | POA: Diagnosis not present

## 2017-10-14 DIAGNOSIS — N186 End stage renal disease: Secondary | ICD-10-CM | POA: Diagnosis not present

## 2017-10-17 DIAGNOSIS — N186 End stage renal disease: Secondary | ICD-10-CM | POA: Diagnosis not present

## 2017-10-17 DIAGNOSIS — N2581 Secondary hyperparathyroidism of renal origin: Secondary | ICD-10-CM | POA: Diagnosis not present

## 2017-10-19 DIAGNOSIS — N2581 Secondary hyperparathyroidism of renal origin: Secondary | ICD-10-CM | POA: Diagnosis not present

## 2017-10-19 DIAGNOSIS — N186 End stage renal disease: Secondary | ICD-10-CM | POA: Diagnosis not present

## 2017-10-20 DIAGNOSIS — Z992 Dependence on renal dialysis: Secondary | ICD-10-CM | POA: Diagnosis not present

## 2017-10-20 DIAGNOSIS — I129 Hypertensive chronic kidney disease with stage 1 through stage 4 chronic kidney disease, or unspecified chronic kidney disease: Secondary | ICD-10-CM | POA: Diagnosis not present

## 2017-10-20 DIAGNOSIS — N186 End stage renal disease: Secondary | ICD-10-CM | POA: Diagnosis not present

## 2017-10-21 DIAGNOSIS — N2581 Secondary hyperparathyroidism of renal origin: Secondary | ICD-10-CM | POA: Diagnosis not present

## 2017-10-21 DIAGNOSIS — I129 Hypertensive chronic kidney disease with stage 1 through stage 4 chronic kidney disease, or unspecified chronic kidney disease: Secondary | ICD-10-CM | POA: Diagnosis not present

## 2017-10-21 DIAGNOSIS — Z992 Dependence on renal dialysis: Secondary | ICD-10-CM | POA: Diagnosis not present

## 2017-10-21 DIAGNOSIS — N186 End stage renal disease: Secondary | ICD-10-CM | POA: Diagnosis not present

## 2017-10-24 DIAGNOSIS — N2581 Secondary hyperparathyroidism of renal origin: Secondary | ICD-10-CM | POA: Diagnosis not present

## 2017-10-24 DIAGNOSIS — N186 End stage renal disease: Secondary | ICD-10-CM | POA: Diagnosis not present

## 2017-10-25 IMAGING — DX DG CHEST 2V
2 series · 2 of 2 positions shown · non-contrast
Comparison: 06/08/2016

CLINICAL DATA: Fever, dialysis patient

EXAM:
CHEST  2 VIEW

[chest pa]
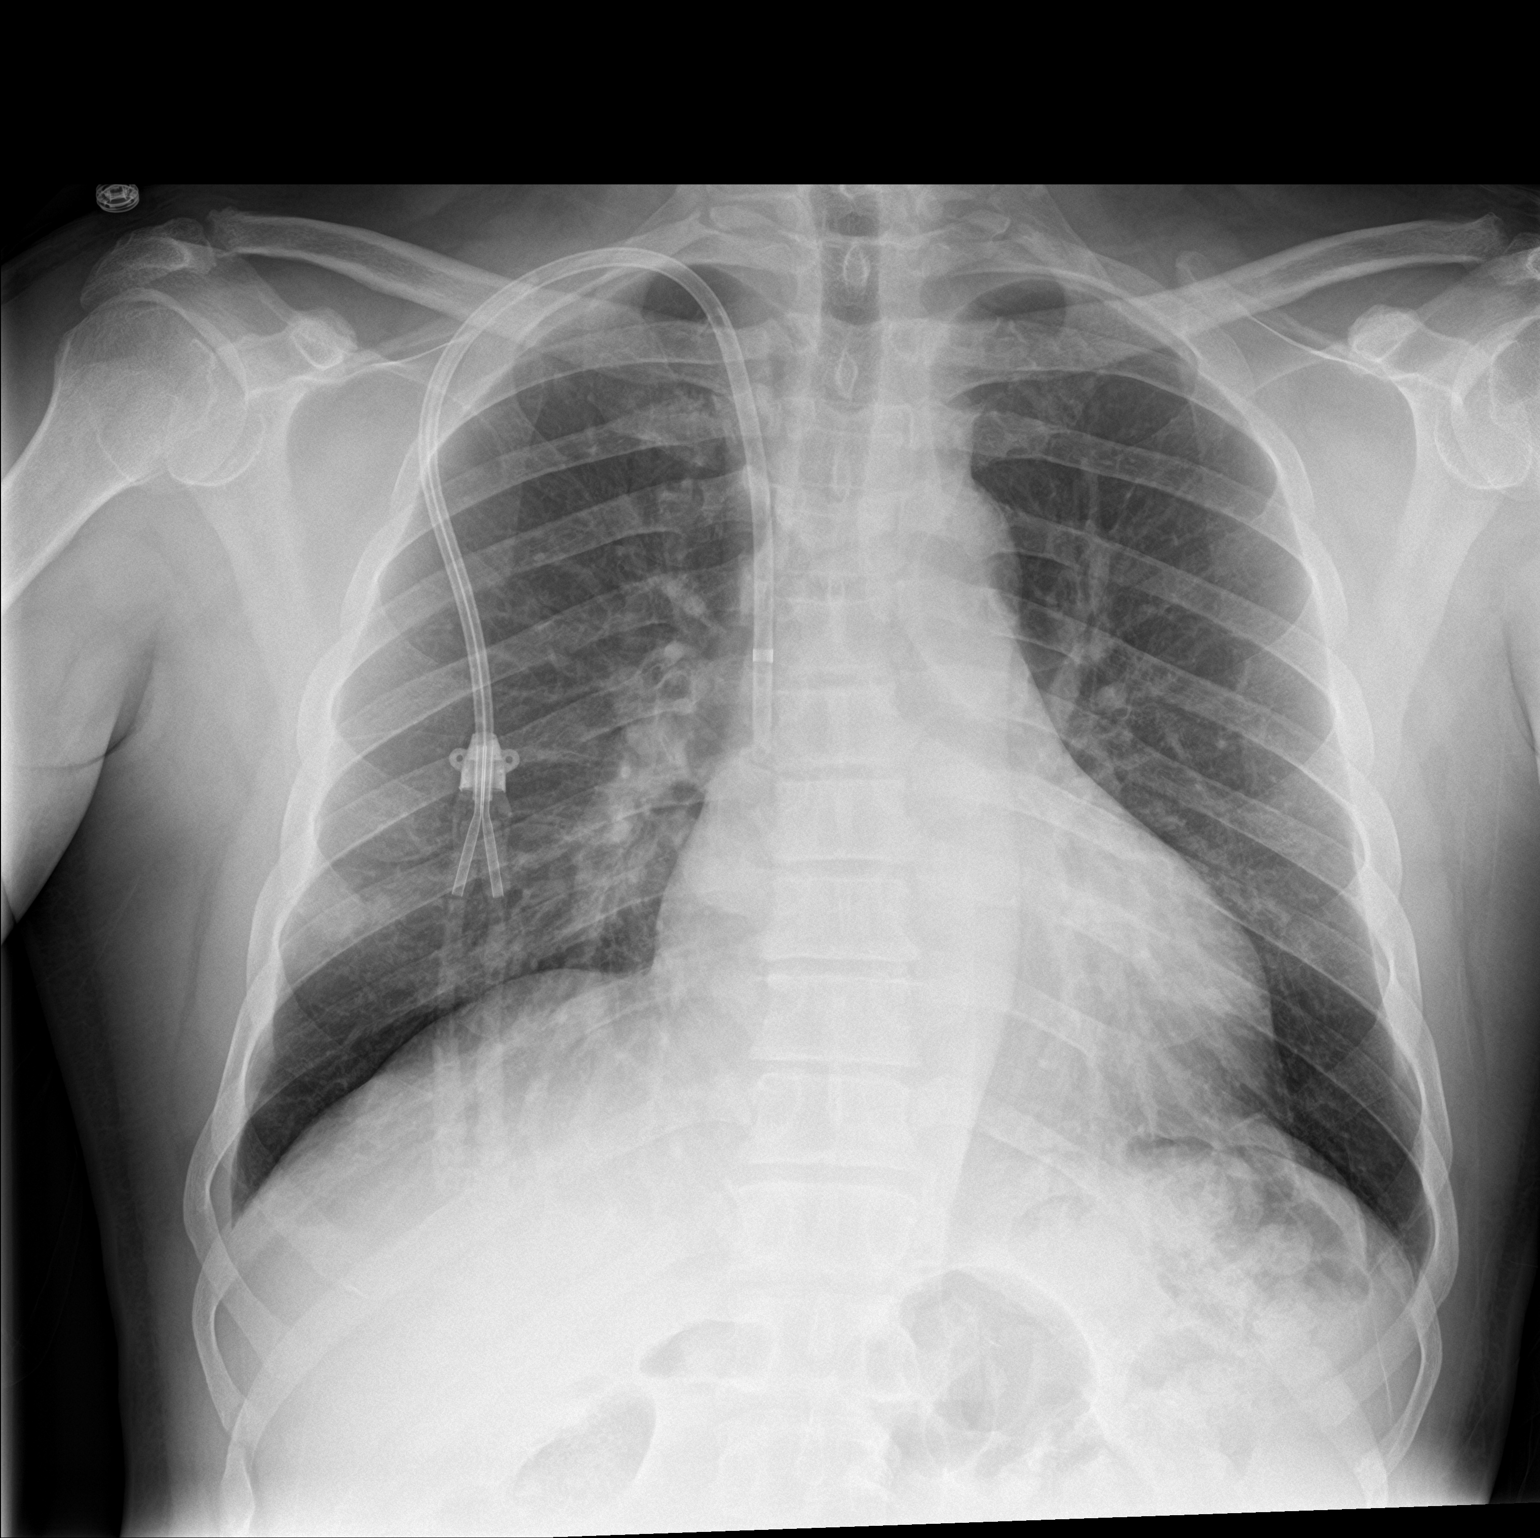

[chest lat]
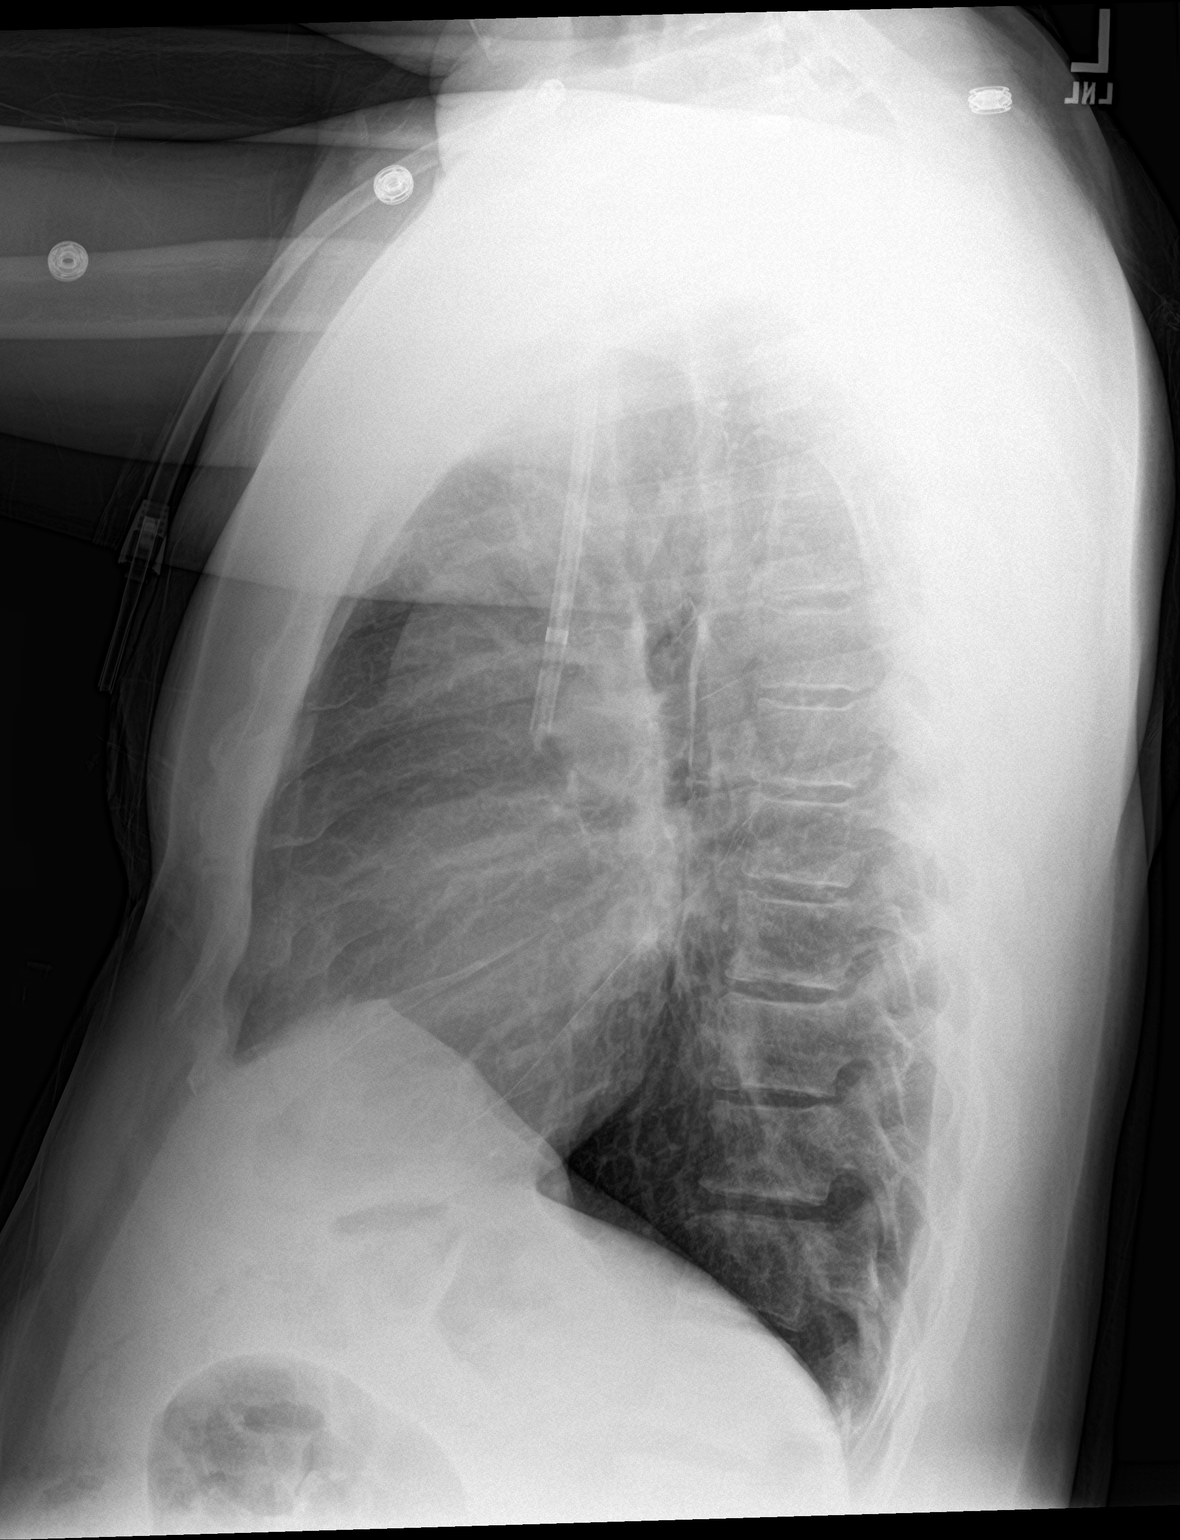

[2 of 2 positions shown; findings below may reference images not displayed]

FINDINGS: Right dialysis catheter remains in place, unchanged. Heart is
borderline in size. Lungs are clear. No effusions or edema. No acute
bony abnormality.
IMPRESSION: No active cardiopulmonary disease.

## 2017-10-26 DIAGNOSIS — N186 End stage renal disease: Secondary | ICD-10-CM | POA: Diagnosis not present

## 2017-10-26 DIAGNOSIS — N2581 Secondary hyperparathyroidism of renal origin: Secondary | ICD-10-CM | POA: Diagnosis not present

## 2017-10-28 DIAGNOSIS — N2581 Secondary hyperparathyroidism of renal origin: Secondary | ICD-10-CM | POA: Diagnosis not present

## 2017-10-28 DIAGNOSIS — N186 End stage renal disease: Secondary | ICD-10-CM | POA: Diagnosis not present

## 2017-10-31 DIAGNOSIS — N2581 Secondary hyperparathyroidism of renal origin: Secondary | ICD-10-CM | POA: Diagnosis not present

## 2017-10-31 DIAGNOSIS — N186 End stage renal disease: Secondary | ICD-10-CM | POA: Diagnosis not present

## 2017-11-02 DIAGNOSIS — N2581 Secondary hyperparathyroidism of renal origin: Secondary | ICD-10-CM | POA: Diagnosis not present

## 2017-11-02 DIAGNOSIS — N186 End stage renal disease: Secondary | ICD-10-CM | POA: Diagnosis not present

## 2017-11-04 DIAGNOSIS — N186 End stage renal disease: Secondary | ICD-10-CM | POA: Diagnosis not present

## 2017-11-04 DIAGNOSIS — N2581 Secondary hyperparathyroidism of renal origin: Secondary | ICD-10-CM | POA: Diagnosis not present

## 2017-11-07 DIAGNOSIS — N2581 Secondary hyperparathyroidism of renal origin: Secondary | ICD-10-CM | POA: Diagnosis not present

## 2017-11-07 DIAGNOSIS — N186 End stage renal disease: Secondary | ICD-10-CM | POA: Diagnosis not present

## 2017-11-09 DIAGNOSIS — N186 End stage renal disease: Secondary | ICD-10-CM | POA: Diagnosis not present

## 2017-11-09 DIAGNOSIS — N2581 Secondary hyperparathyroidism of renal origin: Secondary | ICD-10-CM | POA: Diagnosis not present

## 2017-11-11 DIAGNOSIS — N2581 Secondary hyperparathyroidism of renal origin: Secondary | ICD-10-CM | POA: Diagnosis not present

## 2017-11-11 DIAGNOSIS — N186 End stage renal disease: Secondary | ICD-10-CM | POA: Diagnosis not present

## 2017-11-14 DIAGNOSIS — N186 End stage renal disease: Secondary | ICD-10-CM | POA: Diagnosis not present

## 2017-11-14 DIAGNOSIS — N2581 Secondary hyperparathyroidism of renal origin: Secondary | ICD-10-CM | POA: Diagnosis not present

## 2017-11-16 DIAGNOSIS — N2581 Secondary hyperparathyroidism of renal origin: Secondary | ICD-10-CM | POA: Diagnosis not present

## 2017-11-16 DIAGNOSIS — N186 End stage renal disease: Secondary | ICD-10-CM | POA: Diagnosis not present

## 2017-11-18 DIAGNOSIS — N186 End stage renal disease: Secondary | ICD-10-CM | POA: Diagnosis not present

## 2017-11-18 DIAGNOSIS — N2581 Secondary hyperparathyroidism of renal origin: Secondary | ICD-10-CM | POA: Diagnosis not present

## 2017-11-21 DIAGNOSIS — N186 End stage renal disease: Secondary | ICD-10-CM | POA: Diagnosis not present

## 2017-11-21 DIAGNOSIS — N2581 Secondary hyperparathyroidism of renal origin: Secondary | ICD-10-CM | POA: Diagnosis not present

## 2017-11-23 DIAGNOSIS — N2581 Secondary hyperparathyroidism of renal origin: Secondary | ICD-10-CM | POA: Diagnosis not present

## 2017-11-23 DIAGNOSIS — N186 End stage renal disease: Secondary | ICD-10-CM | POA: Diagnosis not present

## 2017-11-25 DIAGNOSIS — N2581 Secondary hyperparathyroidism of renal origin: Secondary | ICD-10-CM | POA: Diagnosis not present

## 2017-11-25 DIAGNOSIS — N186 End stage renal disease: Secondary | ICD-10-CM | POA: Diagnosis not present

## 2017-11-28 DIAGNOSIS — N2581 Secondary hyperparathyroidism of renal origin: Secondary | ICD-10-CM | POA: Diagnosis not present

## 2017-11-28 DIAGNOSIS — N186 End stage renal disease: Secondary | ICD-10-CM | POA: Diagnosis not present

## 2017-11-30 DIAGNOSIS — N186 End stage renal disease: Secondary | ICD-10-CM | POA: Diagnosis not present

## 2017-11-30 DIAGNOSIS — N2581 Secondary hyperparathyroidism of renal origin: Secondary | ICD-10-CM | POA: Diagnosis not present

## 2017-12-02 DIAGNOSIS — N2581 Secondary hyperparathyroidism of renal origin: Secondary | ICD-10-CM | POA: Diagnosis not present

## 2017-12-02 DIAGNOSIS — N186 End stage renal disease: Secondary | ICD-10-CM | POA: Diagnosis not present

## 2017-12-05 DIAGNOSIS — N2581 Secondary hyperparathyroidism of renal origin: Secondary | ICD-10-CM | POA: Diagnosis not present

## 2017-12-05 DIAGNOSIS — N186 End stage renal disease: Secondary | ICD-10-CM | POA: Diagnosis not present

## 2017-12-07 DIAGNOSIS — N2581 Secondary hyperparathyroidism of renal origin: Secondary | ICD-10-CM | POA: Diagnosis not present

## 2017-12-07 DIAGNOSIS — N186 End stage renal disease: Secondary | ICD-10-CM | POA: Diagnosis not present

## 2017-12-09 DIAGNOSIS — N2581 Secondary hyperparathyroidism of renal origin: Secondary | ICD-10-CM | POA: Diagnosis not present

## 2017-12-09 DIAGNOSIS — N186 End stage renal disease: Secondary | ICD-10-CM | POA: Diagnosis not present

## 2017-12-12 DIAGNOSIS — N2581 Secondary hyperparathyroidism of renal origin: Secondary | ICD-10-CM | POA: Diagnosis not present

## 2017-12-12 DIAGNOSIS — N186 End stage renal disease: Secondary | ICD-10-CM | POA: Diagnosis not present

## 2017-12-14 DIAGNOSIS — N2581 Secondary hyperparathyroidism of renal origin: Secondary | ICD-10-CM | POA: Diagnosis not present

## 2017-12-14 DIAGNOSIS — N186 End stage renal disease: Secondary | ICD-10-CM | POA: Diagnosis not present

## 2017-12-16 DIAGNOSIS — N2581 Secondary hyperparathyroidism of renal origin: Secondary | ICD-10-CM | POA: Diagnosis not present

## 2017-12-16 DIAGNOSIS — N186 End stage renal disease: Secondary | ICD-10-CM | POA: Diagnosis not present

## 2017-12-18 DIAGNOSIS — N186 End stage renal disease: Secondary | ICD-10-CM | POA: Diagnosis not present

## 2017-12-18 DIAGNOSIS — I129 Hypertensive chronic kidney disease with stage 1 through stage 4 chronic kidney disease, or unspecified chronic kidney disease: Secondary | ICD-10-CM | POA: Diagnosis not present

## 2017-12-18 DIAGNOSIS — Z992 Dependence on renal dialysis: Secondary | ICD-10-CM | POA: Diagnosis not present

## 2017-12-19 DIAGNOSIS — N2581 Secondary hyperparathyroidism of renal origin: Secondary | ICD-10-CM | POA: Diagnosis not present

## 2017-12-19 DIAGNOSIS — N186 End stage renal disease: Secondary | ICD-10-CM | POA: Diagnosis not present

## 2017-12-21 DIAGNOSIS — N186 End stage renal disease: Secondary | ICD-10-CM | POA: Diagnosis not present

## 2017-12-21 DIAGNOSIS — N2581 Secondary hyperparathyroidism of renal origin: Secondary | ICD-10-CM | POA: Diagnosis not present

## 2017-12-22 DIAGNOSIS — M1000 Idiopathic gout, unspecified site: Secondary | ICD-10-CM | POA: Diagnosis not present

## 2017-12-22 DIAGNOSIS — Z125 Encounter for screening for malignant neoplasm of prostate: Secondary | ICD-10-CM | POA: Diagnosis not present

## 2017-12-22 DIAGNOSIS — I10 Essential (primary) hypertension: Secondary | ICD-10-CM | POA: Diagnosis not present

## 2017-12-22 DIAGNOSIS — N186 End stage renal disease: Secondary | ICD-10-CM | POA: Diagnosis not present

## 2017-12-22 DIAGNOSIS — Z13220 Encounter for screening for lipoid disorders: Secondary | ICD-10-CM | POA: Diagnosis not present

## 2017-12-22 DIAGNOSIS — E1121 Type 2 diabetes mellitus with diabetic nephropathy: Secondary | ICD-10-CM | POA: Diagnosis not present

## 2017-12-23 DIAGNOSIS — N186 End stage renal disease: Secondary | ICD-10-CM | POA: Diagnosis not present

## 2017-12-23 DIAGNOSIS — N2581 Secondary hyperparathyroidism of renal origin: Secondary | ICD-10-CM | POA: Diagnosis not present

## 2017-12-26 DIAGNOSIS — N2581 Secondary hyperparathyroidism of renal origin: Secondary | ICD-10-CM | POA: Diagnosis not present

## 2017-12-26 DIAGNOSIS — L299 Pruritus, unspecified: Secondary | ICD-10-CM | POA: Diagnosis not present

## 2017-12-26 DIAGNOSIS — N186 End stage renal disease: Secondary | ICD-10-CM | POA: Diagnosis not present

## 2017-12-28 DIAGNOSIS — N2581 Secondary hyperparathyroidism of renal origin: Secondary | ICD-10-CM | POA: Diagnosis not present

## 2017-12-28 DIAGNOSIS — N186 End stage renal disease: Secondary | ICD-10-CM | POA: Diagnosis not present

## 2017-12-28 DIAGNOSIS — L299 Pruritus, unspecified: Secondary | ICD-10-CM | POA: Diagnosis not present

## 2017-12-30 DIAGNOSIS — L299 Pruritus, unspecified: Secondary | ICD-10-CM | POA: Diagnosis not present

## 2017-12-30 DIAGNOSIS — N186 End stage renal disease: Secondary | ICD-10-CM | POA: Diagnosis not present

## 2017-12-30 DIAGNOSIS — N2581 Secondary hyperparathyroidism of renal origin: Secondary | ICD-10-CM | POA: Diagnosis not present

## 2018-01-02 DIAGNOSIS — N2581 Secondary hyperparathyroidism of renal origin: Secondary | ICD-10-CM | POA: Diagnosis not present

## 2018-01-02 DIAGNOSIS — N186 End stage renal disease: Secondary | ICD-10-CM | POA: Diagnosis not present

## 2018-01-04 DIAGNOSIS — N186 End stage renal disease: Secondary | ICD-10-CM | POA: Diagnosis not present

## 2018-01-04 DIAGNOSIS — N2581 Secondary hyperparathyroidism of renal origin: Secondary | ICD-10-CM | POA: Diagnosis not present

## 2018-01-06 DIAGNOSIS — N2581 Secondary hyperparathyroidism of renal origin: Secondary | ICD-10-CM | POA: Diagnosis not present

## 2018-01-06 DIAGNOSIS — N186 End stage renal disease: Secondary | ICD-10-CM | POA: Diagnosis not present

## 2018-01-09 DIAGNOSIS — N2581 Secondary hyperparathyroidism of renal origin: Secondary | ICD-10-CM | POA: Diagnosis not present

## 2018-01-09 DIAGNOSIS — N186 End stage renal disease: Secondary | ICD-10-CM | POA: Diagnosis not present

## 2018-01-11 DIAGNOSIS — N2581 Secondary hyperparathyroidism of renal origin: Secondary | ICD-10-CM | POA: Diagnosis not present

## 2018-01-11 DIAGNOSIS — N186 End stage renal disease: Secondary | ICD-10-CM | POA: Diagnosis not present

## 2018-01-13 DIAGNOSIS — N186 End stage renal disease: Secondary | ICD-10-CM | POA: Diagnosis not present

## 2018-01-13 DIAGNOSIS — N2581 Secondary hyperparathyroidism of renal origin: Secondary | ICD-10-CM | POA: Diagnosis not present

## 2018-01-16 DIAGNOSIS — N2581 Secondary hyperparathyroidism of renal origin: Secondary | ICD-10-CM | POA: Diagnosis not present

## 2018-01-16 DIAGNOSIS — N186 End stage renal disease: Secondary | ICD-10-CM | POA: Diagnosis not present

## 2018-01-17 DIAGNOSIS — N186 End stage renal disease: Secondary | ICD-10-CM | POA: Diagnosis not present

## 2018-01-17 DIAGNOSIS — Z992 Dependence on renal dialysis: Secondary | ICD-10-CM | POA: Diagnosis not present

## 2018-01-17 DIAGNOSIS — I129 Hypertensive chronic kidney disease with stage 1 through stage 4 chronic kidney disease, or unspecified chronic kidney disease: Secondary | ICD-10-CM | POA: Diagnosis not present

## 2018-01-18 DIAGNOSIS — N2581 Secondary hyperparathyroidism of renal origin: Secondary | ICD-10-CM | POA: Diagnosis not present

## 2018-01-18 DIAGNOSIS — N186 End stage renal disease: Secondary | ICD-10-CM | POA: Diagnosis not present

## 2018-01-20 DIAGNOSIS — N2581 Secondary hyperparathyroidism of renal origin: Secondary | ICD-10-CM | POA: Diagnosis not present

## 2018-01-20 DIAGNOSIS — N186 End stage renal disease: Secondary | ICD-10-CM | POA: Diagnosis not present

## 2018-01-23 DIAGNOSIS — N186 End stage renal disease: Secondary | ICD-10-CM | POA: Diagnosis not present

## 2018-01-23 DIAGNOSIS — N2581 Secondary hyperparathyroidism of renal origin: Secondary | ICD-10-CM | POA: Diagnosis not present

## 2018-01-25 DIAGNOSIS — N186 End stage renal disease: Secondary | ICD-10-CM | POA: Diagnosis not present

## 2018-01-25 DIAGNOSIS — N2581 Secondary hyperparathyroidism of renal origin: Secondary | ICD-10-CM | POA: Diagnosis not present

## 2018-01-27 DIAGNOSIS — N186 End stage renal disease: Secondary | ICD-10-CM | POA: Diagnosis not present

## 2018-01-27 DIAGNOSIS — N2581 Secondary hyperparathyroidism of renal origin: Secondary | ICD-10-CM | POA: Diagnosis not present

## 2018-01-30 DIAGNOSIS — N186 End stage renal disease: Secondary | ICD-10-CM | POA: Diagnosis not present

## 2018-01-30 DIAGNOSIS — N2581 Secondary hyperparathyroidism of renal origin: Secondary | ICD-10-CM | POA: Diagnosis not present

## 2018-02-01 DIAGNOSIS — N186 End stage renal disease: Secondary | ICD-10-CM | POA: Diagnosis not present

## 2018-02-01 DIAGNOSIS — N2581 Secondary hyperparathyroidism of renal origin: Secondary | ICD-10-CM | POA: Diagnosis not present

## 2018-02-03 DIAGNOSIS — N2581 Secondary hyperparathyroidism of renal origin: Secondary | ICD-10-CM | POA: Diagnosis not present

## 2018-02-03 DIAGNOSIS — N186 End stage renal disease: Secondary | ICD-10-CM | POA: Diagnosis not present

## 2018-02-06 DIAGNOSIS — N186 End stage renal disease: Secondary | ICD-10-CM | POA: Diagnosis not present

## 2018-02-06 DIAGNOSIS — N2581 Secondary hyperparathyroidism of renal origin: Secondary | ICD-10-CM | POA: Diagnosis not present

## 2018-02-08 DIAGNOSIS — N186 End stage renal disease: Secondary | ICD-10-CM | POA: Diagnosis not present

## 2018-02-08 DIAGNOSIS — N2581 Secondary hyperparathyroidism of renal origin: Secondary | ICD-10-CM | POA: Diagnosis not present

## 2018-02-09 DIAGNOSIS — T82858A Stenosis of vascular prosthetic devices, implants and grafts, initial encounter: Secondary | ICD-10-CM | POA: Diagnosis not present

## 2018-02-09 DIAGNOSIS — N186 End stage renal disease: Secondary | ICD-10-CM | POA: Diagnosis not present

## 2018-02-09 DIAGNOSIS — I871 Compression of vein: Secondary | ICD-10-CM | POA: Diagnosis not present

## 2018-02-09 DIAGNOSIS — Z992 Dependence on renal dialysis: Secondary | ICD-10-CM | POA: Diagnosis not present

## 2018-02-10 DIAGNOSIS — N2581 Secondary hyperparathyroidism of renal origin: Secondary | ICD-10-CM | POA: Diagnosis not present

## 2018-02-10 DIAGNOSIS — N186 End stage renal disease: Secondary | ICD-10-CM | POA: Diagnosis not present

## 2018-02-13 DIAGNOSIS — N186 End stage renal disease: Secondary | ICD-10-CM | POA: Diagnosis not present

## 2018-02-13 DIAGNOSIS — N2581 Secondary hyperparathyroidism of renal origin: Secondary | ICD-10-CM | POA: Diagnosis not present

## 2018-02-15 DIAGNOSIS — N2581 Secondary hyperparathyroidism of renal origin: Secondary | ICD-10-CM | POA: Diagnosis not present

## 2018-02-15 DIAGNOSIS — N186 End stage renal disease: Secondary | ICD-10-CM | POA: Diagnosis not present

## 2018-02-17 DIAGNOSIS — I129 Hypertensive chronic kidney disease with stage 1 through stage 4 chronic kidney disease, or unspecified chronic kidney disease: Secondary | ICD-10-CM | POA: Diagnosis not present

## 2018-02-17 DIAGNOSIS — Z992 Dependence on renal dialysis: Secondary | ICD-10-CM | POA: Diagnosis not present

## 2018-02-17 DIAGNOSIS — N2581 Secondary hyperparathyroidism of renal origin: Secondary | ICD-10-CM | POA: Diagnosis not present

## 2018-02-17 DIAGNOSIS — N186 End stage renal disease: Secondary | ICD-10-CM | POA: Diagnosis not present

## 2018-02-20 DIAGNOSIS — N2581 Secondary hyperparathyroidism of renal origin: Secondary | ICD-10-CM | POA: Diagnosis not present

## 2018-02-20 DIAGNOSIS — N186 End stage renal disease: Secondary | ICD-10-CM | POA: Diagnosis not present

## 2018-02-22 DIAGNOSIS — N186 End stage renal disease: Secondary | ICD-10-CM | POA: Diagnosis not present

## 2018-02-22 DIAGNOSIS — N2581 Secondary hyperparathyroidism of renal origin: Secondary | ICD-10-CM | POA: Diagnosis not present

## 2018-02-24 DIAGNOSIS — N2581 Secondary hyperparathyroidism of renal origin: Secondary | ICD-10-CM | POA: Diagnosis not present

## 2018-02-24 DIAGNOSIS — N186 End stage renal disease: Secondary | ICD-10-CM | POA: Diagnosis not present

## 2018-02-27 DIAGNOSIS — N2581 Secondary hyperparathyroidism of renal origin: Secondary | ICD-10-CM | POA: Diagnosis not present

## 2018-02-27 DIAGNOSIS — N186 End stage renal disease: Secondary | ICD-10-CM | POA: Diagnosis not present

## 2018-03-01 DIAGNOSIS — N186 End stage renal disease: Secondary | ICD-10-CM | POA: Diagnosis not present

## 2018-03-01 DIAGNOSIS — N2581 Secondary hyperparathyroidism of renal origin: Secondary | ICD-10-CM | POA: Diagnosis not present

## 2018-03-03 DIAGNOSIS — N2581 Secondary hyperparathyroidism of renal origin: Secondary | ICD-10-CM | POA: Diagnosis not present

## 2018-03-03 DIAGNOSIS — N186 End stage renal disease: Secondary | ICD-10-CM | POA: Diagnosis not present

## 2018-03-06 DIAGNOSIS — N186 End stage renal disease: Secondary | ICD-10-CM | POA: Diagnosis not present

## 2018-03-06 DIAGNOSIS — N2581 Secondary hyperparathyroidism of renal origin: Secondary | ICD-10-CM | POA: Diagnosis not present

## 2018-03-08 DIAGNOSIS — N2581 Secondary hyperparathyroidism of renal origin: Secondary | ICD-10-CM | POA: Diagnosis not present

## 2018-03-08 DIAGNOSIS — N186 End stage renal disease: Secondary | ICD-10-CM | POA: Diagnosis not present

## 2018-03-10 DIAGNOSIS — N2581 Secondary hyperparathyroidism of renal origin: Secondary | ICD-10-CM | POA: Diagnosis not present

## 2018-03-10 DIAGNOSIS — N186 End stage renal disease: Secondary | ICD-10-CM | POA: Diagnosis not present

## 2018-03-13 DIAGNOSIS — N2581 Secondary hyperparathyroidism of renal origin: Secondary | ICD-10-CM | POA: Diagnosis not present

## 2018-03-13 DIAGNOSIS — N186 End stage renal disease: Secondary | ICD-10-CM | POA: Diagnosis not present

## 2018-03-15 DIAGNOSIS — N2581 Secondary hyperparathyroidism of renal origin: Secondary | ICD-10-CM | POA: Diagnosis not present

## 2018-03-15 DIAGNOSIS — N186 End stage renal disease: Secondary | ICD-10-CM | POA: Diagnosis not present

## 2018-03-17 DIAGNOSIS — N186 End stage renal disease: Secondary | ICD-10-CM | POA: Diagnosis not present

## 2018-03-17 DIAGNOSIS — N2581 Secondary hyperparathyroidism of renal origin: Secondary | ICD-10-CM | POA: Diagnosis not present

## 2018-03-19 DIAGNOSIS — I129 Hypertensive chronic kidney disease with stage 1 through stage 4 chronic kidney disease, or unspecified chronic kidney disease: Secondary | ICD-10-CM | POA: Diagnosis not present

## 2018-03-19 DIAGNOSIS — Z992 Dependence on renal dialysis: Secondary | ICD-10-CM | POA: Diagnosis not present

## 2018-03-19 DIAGNOSIS — N186 End stage renal disease: Secondary | ICD-10-CM | POA: Diagnosis not present

## 2018-03-20 DIAGNOSIS — N2581 Secondary hyperparathyroidism of renal origin: Secondary | ICD-10-CM | POA: Diagnosis not present

## 2018-03-20 DIAGNOSIS — N186 End stage renal disease: Secondary | ICD-10-CM | POA: Diagnosis not present

## 2018-03-22 DIAGNOSIS — N2581 Secondary hyperparathyroidism of renal origin: Secondary | ICD-10-CM | POA: Diagnosis not present

## 2018-03-22 DIAGNOSIS — N186 End stage renal disease: Secondary | ICD-10-CM | POA: Diagnosis not present

## 2018-03-24 DIAGNOSIS — N186 End stage renal disease: Secondary | ICD-10-CM | POA: Diagnosis not present

## 2018-03-24 DIAGNOSIS — N2581 Secondary hyperparathyroidism of renal origin: Secondary | ICD-10-CM | POA: Diagnosis not present

## 2018-03-27 DIAGNOSIS — N2581 Secondary hyperparathyroidism of renal origin: Secondary | ICD-10-CM | POA: Diagnosis not present

## 2018-03-27 DIAGNOSIS — N186 End stage renal disease: Secondary | ICD-10-CM | POA: Diagnosis not present

## 2018-03-28 DIAGNOSIS — E1121 Type 2 diabetes mellitus with diabetic nephropathy: Secondary | ICD-10-CM | POA: Diagnosis not present

## 2018-03-28 DIAGNOSIS — J302 Other seasonal allergic rhinitis: Secondary | ICD-10-CM | POA: Diagnosis not present

## 2018-03-28 DIAGNOSIS — N186 End stage renal disease: Secondary | ICD-10-CM | POA: Diagnosis not present

## 2018-03-28 DIAGNOSIS — I10 Essential (primary) hypertension: Secondary | ICD-10-CM | POA: Diagnosis not present

## 2018-03-29 DIAGNOSIS — N186 End stage renal disease: Secondary | ICD-10-CM | POA: Diagnosis not present

## 2018-03-29 DIAGNOSIS — N2581 Secondary hyperparathyroidism of renal origin: Secondary | ICD-10-CM | POA: Diagnosis not present

## 2018-03-31 DIAGNOSIS — N186 End stage renal disease: Secondary | ICD-10-CM | POA: Diagnosis not present

## 2018-03-31 DIAGNOSIS — N2581 Secondary hyperparathyroidism of renal origin: Secondary | ICD-10-CM | POA: Diagnosis not present

## 2018-04-03 DIAGNOSIS — N186 End stage renal disease: Secondary | ICD-10-CM | POA: Diagnosis not present

## 2018-04-03 DIAGNOSIS — N2581 Secondary hyperparathyroidism of renal origin: Secondary | ICD-10-CM | POA: Diagnosis not present

## 2018-04-05 DIAGNOSIS — N2581 Secondary hyperparathyroidism of renal origin: Secondary | ICD-10-CM | POA: Diagnosis not present

## 2018-04-05 DIAGNOSIS — N186 End stage renal disease: Secondary | ICD-10-CM | POA: Diagnosis not present

## 2018-04-07 DIAGNOSIS — N2581 Secondary hyperparathyroidism of renal origin: Secondary | ICD-10-CM | POA: Diagnosis not present

## 2018-04-07 DIAGNOSIS — N186 End stage renal disease: Secondary | ICD-10-CM | POA: Diagnosis not present

## 2018-04-10 DIAGNOSIS — N186 End stage renal disease: Secondary | ICD-10-CM | POA: Diagnosis not present

## 2018-04-10 DIAGNOSIS — N2581 Secondary hyperparathyroidism of renal origin: Secondary | ICD-10-CM | POA: Diagnosis not present

## 2018-04-12 DIAGNOSIS — N186 End stage renal disease: Secondary | ICD-10-CM | POA: Diagnosis not present

## 2018-04-12 DIAGNOSIS — N2581 Secondary hyperparathyroidism of renal origin: Secondary | ICD-10-CM | POA: Diagnosis not present

## 2018-04-14 DIAGNOSIS — N186 End stage renal disease: Secondary | ICD-10-CM | POA: Diagnosis not present

## 2018-04-14 DIAGNOSIS — N2581 Secondary hyperparathyroidism of renal origin: Secondary | ICD-10-CM | POA: Diagnosis not present

## 2018-04-17 DIAGNOSIS — N2581 Secondary hyperparathyroidism of renal origin: Secondary | ICD-10-CM | POA: Diagnosis not present

## 2018-04-17 DIAGNOSIS — L299 Pruritus, unspecified: Secondary | ICD-10-CM | POA: Diagnosis not present

## 2018-04-17 DIAGNOSIS — N186 End stage renal disease: Secondary | ICD-10-CM | POA: Diagnosis not present

## 2018-04-19 DIAGNOSIS — N186 End stage renal disease: Secondary | ICD-10-CM | POA: Diagnosis not present

## 2018-04-19 DIAGNOSIS — L299 Pruritus, unspecified: Secondary | ICD-10-CM | POA: Diagnosis not present

## 2018-04-19 DIAGNOSIS — I129 Hypertensive chronic kidney disease with stage 1 through stage 4 chronic kidney disease, or unspecified chronic kidney disease: Secondary | ICD-10-CM | POA: Diagnosis not present

## 2018-04-19 DIAGNOSIS — Z992 Dependence on renal dialysis: Secondary | ICD-10-CM | POA: Diagnosis not present

## 2018-04-19 DIAGNOSIS — N2581 Secondary hyperparathyroidism of renal origin: Secondary | ICD-10-CM | POA: Diagnosis not present

## 2018-04-21 DIAGNOSIS — N186 End stage renal disease: Secondary | ICD-10-CM | POA: Diagnosis not present

## 2018-04-21 DIAGNOSIS — N2581 Secondary hyperparathyroidism of renal origin: Secondary | ICD-10-CM | POA: Diagnosis not present

## 2018-04-24 DIAGNOSIS — N2581 Secondary hyperparathyroidism of renal origin: Secondary | ICD-10-CM | POA: Diagnosis not present

## 2018-04-24 DIAGNOSIS — N186 End stage renal disease: Secondary | ICD-10-CM | POA: Diagnosis not present

## 2018-04-26 DIAGNOSIS — N186 End stage renal disease: Secondary | ICD-10-CM | POA: Diagnosis not present

## 2018-04-26 DIAGNOSIS — N2581 Secondary hyperparathyroidism of renal origin: Secondary | ICD-10-CM | POA: Diagnosis not present

## 2018-04-28 DIAGNOSIS — N2581 Secondary hyperparathyroidism of renal origin: Secondary | ICD-10-CM | POA: Diagnosis not present

## 2018-04-28 DIAGNOSIS — N186 End stage renal disease: Secondary | ICD-10-CM | POA: Diagnosis not present

## 2018-05-01 DIAGNOSIS — N2581 Secondary hyperparathyroidism of renal origin: Secondary | ICD-10-CM | POA: Diagnosis not present

## 2018-05-01 DIAGNOSIS — N186 End stage renal disease: Secondary | ICD-10-CM | POA: Diagnosis not present

## 2018-05-03 DIAGNOSIS — N2581 Secondary hyperparathyroidism of renal origin: Secondary | ICD-10-CM | POA: Diagnosis not present

## 2018-05-03 DIAGNOSIS — N186 End stage renal disease: Secondary | ICD-10-CM | POA: Diagnosis not present

## 2018-05-05 DIAGNOSIS — N186 End stage renal disease: Secondary | ICD-10-CM | POA: Diagnosis not present

## 2018-05-05 DIAGNOSIS — N2581 Secondary hyperparathyroidism of renal origin: Secondary | ICD-10-CM | POA: Diagnosis not present

## 2018-05-08 DIAGNOSIS — N186 End stage renal disease: Secondary | ICD-10-CM | POA: Diagnosis not present

## 2018-05-08 DIAGNOSIS — N2581 Secondary hyperparathyroidism of renal origin: Secondary | ICD-10-CM | POA: Diagnosis not present

## 2018-05-10 DIAGNOSIS — N2581 Secondary hyperparathyroidism of renal origin: Secondary | ICD-10-CM | POA: Diagnosis not present

## 2018-05-10 DIAGNOSIS — N186 End stage renal disease: Secondary | ICD-10-CM | POA: Diagnosis not present

## 2018-05-12 DIAGNOSIS — N2581 Secondary hyperparathyroidism of renal origin: Secondary | ICD-10-CM | POA: Diagnosis not present

## 2018-05-12 DIAGNOSIS — N186 End stage renal disease: Secondary | ICD-10-CM | POA: Diagnosis not present

## 2018-05-15 DIAGNOSIS — N2581 Secondary hyperparathyroidism of renal origin: Secondary | ICD-10-CM | POA: Diagnosis not present

## 2018-05-15 DIAGNOSIS — N186 End stage renal disease: Secondary | ICD-10-CM | POA: Diagnosis not present

## 2018-05-17 DIAGNOSIS — N2581 Secondary hyperparathyroidism of renal origin: Secondary | ICD-10-CM | POA: Diagnosis not present

## 2018-05-17 DIAGNOSIS — N186 End stage renal disease: Secondary | ICD-10-CM | POA: Diagnosis not present

## 2018-05-19 DIAGNOSIS — N186 End stage renal disease: Secondary | ICD-10-CM | POA: Diagnosis not present

## 2018-05-19 DIAGNOSIS — N2581 Secondary hyperparathyroidism of renal origin: Secondary | ICD-10-CM | POA: Diagnosis not present

## 2018-05-20 DIAGNOSIS — I129 Hypertensive chronic kidney disease with stage 1 through stage 4 chronic kidney disease, or unspecified chronic kidney disease: Secondary | ICD-10-CM | POA: Diagnosis not present

## 2018-05-20 DIAGNOSIS — Z992 Dependence on renal dialysis: Secondary | ICD-10-CM | POA: Diagnosis not present

## 2018-05-20 DIAGNOSIS — N186 End stage renal disease: Secondary | ICD-10-CM | POA: Diagnosis not present

## 2018-05-22 DIAGNOSIS — N2581 Secondary hyperparathyroidism of renal origin: Secondary | ICD-10-CM | POA: Diagnosis not present

## 2018-05-22 DIAGNOSIS — N186 End stage renal disease: Secondary | ICD-10-CM | POA: Diagnosis not present

## 2018-05-24 DIAGNOSIS — N2581 Secondary hyperparathyroidism of renal origin: Secondary | ICD-10-CM | POA: Diagnosis not present

## 2018-05-24 DIAGNOSIS — N186 End stage renal disease: Secondary | ICD-10-CM | POA: Diagnosis not present

## 2018-05-26 DIAGNOSIS — N2581 Secondary hyperparathyroidism of renal origin: Secondary | ICD-10-CM | POA: Diagnosis not present

## 2018-05-26 DIAGNOSIS — N186 End stage renal disease: Secondary | ICD-10-CM | POA: Diagnosis not present

## 2018-05-29 DIAGNOSIS — N2581 Secondary hyperparathyroidism of renal origin: Secondary | ICD-10-CM | POA: Diagnosis not present

## 2018-05-29 DIAGNOSIS — N186 End stage renal disease: Secondary | ICD-10-CM | POA: Diagnosis not present

## 2018-05-31 DIAGNOSIS — N2581 Secondary hyperparathyroidism of renal origin: Secondary | ICD-10-CM | POA: Diagnosis not present

## 2018-05-31 DIAGNOSIS — N186 End stage renal disease: Secondary | ICD-10-CM | POA: Diagnosis not present

## 2018-06-02 DIAGNOSIS — N2581 Secondary hyperparathyroidism of renal origin: Secondary | ICD-10-CM | POA: Diagnosis not present

## 2018-06-02 DIAGNOSIS — N186 End stage renal disease: Secondary | ICD-10-CM | POA: Diagnosis not present

## 2018-06-05 DIAGNOSIS — N186 End stage renal disease: Secondary | ICD-10-CM | POA: Diagnosis not present

## 2018-06-05 DIAGNOSIS — N2581 Secondary hyperparathyroidism of renal origin: Secondary | ICD-10-CM | POA: Diagnosis not present

## 2018-06-07 DIAGNOSIS — N186 End stage renal disease: Secondary | ICD-10-CM | POA: Diagnosis not present

## 2018-06-07 DIAGNOSIS — N2581 Secondary hyperparathyroidism of renal origin: Secondary | ICD-10-CM | POA: Diagnosis not present

## 2018-06-09 DIAGNOSIS — N2581 Secondary hyperparathyroidism of renal origin: Secondary | ICD-10-CM | POA: Diagnosis not present

## 2018-06-09 DIAGNOSIS — N186 End stage renal disease: Secondary | ICD-10-CM | POA: Diagnosis not present

## 2018-06-12 DIAGNOSIS — N186 End stage renal disease: Secondary | ICD-10-CM | POA: Diagnosis not present

## 2018-06-12 DIAGNOSIS — Z23 Encounter for immunization: Secondary | ICD-10-CM | POA: Diagnosis not present

## 2018-06-12 DIAGNOSIS — N2581 Secondary hyperparathyroidism of renal origin: Secondary | ICD-10-CM | POA: Diagnosis not present

## 2018-06-14 DIAGNOSIS — N2581 Secondary hyperparathyroidism of renal origin: Secondary | ICD-10-CM | POA: Diagnosis not present

## 2018-06-14 DIAGNOSIS — N186 End stage renal disease: Secondary | ICD-10-CM | POA: Diagnosis not present

## 2018-06-14 DIAGNOSIS — Z23 Encounter for immunization: Secondary | ICD-10-CM | POA: Diagnosis not present

## 2018-06-16 DIAGNOSIS — N186 End stage renal disease: Secondary | ICD-10-CM | POA: Diagnosis not present

## 2018-06-16 DIAGNOSIS — Z23 Encounter for immunization: Secondary | ICD-10-CM | POA: Diagnosis not present

## 2018-06-16 DIAGNOSIS — N2581 Secondary hyperparathyroidism of renal origin: Secondary | ICD-10-CM | POA: Diagnosis not present

## 2018-06-19 DIAGNOSIS — N186 End stage renal disease: Secondary | ICD-10-CM | POA: Diagnosis not present

## 2018-06-19 DIAGNOSIS — N2581 Secondary hyperparathyroidism of renal origin: Secondary | ICD-10-CM | POA: Diagnosis not present

## 2018-06-19 DIAGNOSIS — I129 Hypertensive chronic kidney disease with stage 1 through stage 4 chronic kidney disease, or unspecified chronic kidney disease: Secondary | ICD-10-CM | POA: Diagnosis not present

## 2018-06-19 DIAGNOSIS — Z992 Dependence on renal dialysis: Secondary | ICD-10-CM | POA: Diagnosis not present

## 2018-06-21 DIAGNOSIS — N2581 Secondary hyperparathyroidism of renal origin: Secondary | ICD-10-CM | POA: Diagnosis not present

## 2018-06-21 DIAGNOSIS — N186 End stage renal disease: Secondary | ICD-10-CM | POA: Diagnosis not present

## 2018-06-23 DIAGNOSIS — N2581 Secondary hyperparathyroidism of renal origin: Secondary | ICD-10-CM | POA: Diagnosis not present

## 2018-06-23 DIAGNOSIS — N186 End stage renal disease: Secondary | ICD-10-CM | POA: Diagnosis not present

## 2018-06-26 DIAGNOSIS — N2581 Secondary hyperparathyroidism of renal origin: Secondary | ICD-10-CM | POA: Diagnosis not present

## 2018-06-26 DIAGNOSIS — N186 End stage renal disease: Secondary | ICD-10-CM | POA: Diagnosis not present

## 2018-06-26 DIAGNOSIS — Z23 Encounter for immunization: Secondary | ICD-10-CM | POA: Diagnosis not present

## 2018-06-27 DIAGNOSIS — N186 End stage renal disease: Secondary | ICD-10-CM | POA: Diagnosis not present

## 2018-06-27 DIAGNOSIS — K219 Gastro-esophageal reflux disease without esophagitis: Secondary | ICD-10-CM | POA: Diagnosis not present

## 2018-06-27 DIAGNOSIS — E1121 Type 2 diabetes mellitus with diabetic nephropathy: Secondary | ICD-10-CM | POA: Diagnosis not present

## 2018-06-27 DIAGNOSIS — I10 Essential (primary) hypertension: Secondary | ICD-10-CM | POA: Diagnosis not present

## 2018-06-28 DIAGNOSIS — Z23 Encounter for immunization: Secondary | ICD-10-CM | POA: Diagnosis not present

## 2018-06-28 DIAGNOSIS — N2581 Secondary hyperparathyroidism of renal origin: Secondary | ICD-10-CM | POA: Diagnosis not present

## 2018-06-28 DIAGNOSIS — N186 End stage renal disease: Secondary | ICD-10-CM | POA: Diagnosis not present

## 2018-06-30 DIAGNOSIS — N186 End stage renal disease: Secondary | ICD-10-CM | POA: Diagnosis not present

## 2018-06-30 DIAGNOSIS — Z23 Encounter for immunization: Secondary | ICD-10-CM | POA: Diagnosis not present

## 2018-06-30 DIAGNOSIS — N2581 Secondary hyperparathyroidism of renal origin: Secondary | ICD-10-CM | POA: Diagnosis not present

## 2018-07-03 DIAGNOSIS — N186 End stage renal disease: Secondary | ICD-10-CM | POA: Diagnosis not present

## 2018-07-03 DIAGNOSIS — N2581 Secondary hyperparathyroidism of renal origin: Secondary | ICD-10-CM | POA: Diagnosis not present

## 2018-07-05 DIAGNOSIS — N2581 Secondary hyperparathyroidism of renal origin: Secondary | ICD-10-CM | POA: Diagnosis not present

## 2018-07-05 DIAGNOSIS — N186 End stage renal disease: Secondary | ICD-10-CM | POA: Diagnosis not present

## 2018-07-07 DIAGNOSIS — N186 End stage renal disease: Secondary | ICD-10-CM | POA: Diagnosis not present

## 2018-07-07 DIAGNOSIS — N2581 Secondary hyperparathyroidism of renal origin: Secondary | ICD-10-CM | POA: Diagnosis not present

## 2018-07-10 DIAGNOSIS — N186 End stage renal disease: Secondary | ICD-10-CM | POA: Diagnosis not present

## 2018-07-10 DIAGNOSIS — N2581 Secondary hyperparathyroidism of renal origin: Secondary | ICD-10-CM | POA: Diagnosis not present

## 2018-07-12 DIAGNOSIS — N2581 Secondary hyperparathyroidism of renal origin: Secondary | ICD-10-CM | POA: Diagnosis not present

## 2018-07-12 DIAGNOSIS — N186 End stage renal disease: Secondary | ICD-10-CM | POA: Diagnosis not present

## 2018-07-14 DIAGNOSIS — N2581 Secondary hyperparathyroidism of renal origin: Secondary | ICD-10-CM | POA: Diagnosis not present

## 2018-07-14 DIAGNOSIS — N186 End stage renal disease: Secondary | ICD-10-CM | POA: Diagnosis not present

## 2018-07-17 DIAGNOSIS — N2581 Secondary hyperparathyroidism of renal origin: Secondary | ICD-10-CM | POA: Diagnosis not present

## 2018-07-17 DIAGNOSIS — N186 End stage renal disease: Secondary | ICD-10-CM | POA: Diagnosis not present

## 2018-07-19 DIAGNOSIS — N186 End stage renal disease: Secondary | ICD-10-CM | POA: Diagnosis not present

## 2018-07-19 DIAGNOSIS — N2581 Secondary hyperparathyroidism of renal origin: Secondary | ICD-10-CM | POA: Diagnosis not present

## 2018-07-20 DIAGNOSIS — Z992 Dependence on renal dialysis: Secondary | ICD-10-CM | POA: Diagnosis not present

## 2018-07-20 DIAGNOSIS — I129 Hypertensive chronic kidney disease with stage 1 through stage 4 chronic kidney disease, or unspecified chronic kidney disease: Secondary | ICD-10-CM | POA: Diagnosis not present

## 2018-07-20 DIAGNOSIS — N186 End stage renal disease: Secondary | ICD-10-CM | POA: Diagnosis not present

## 2018-07-21 DIAGNOSIS — N2581 Secondary hyperparathyroidism of renal origin: Secondary | ICD-10-CM | POA: Diagnosis not present

## 2018-07-21 DIAGNOSIS — N186 End stage renal disease: Secondary | ICD-10-CM | POA: Diagnosis not present

## 2018-07-24 DIAGNOSIS — N2581 Secondary hyperparathyroidism of renal origin: Secondary | ICD-10-CM | POA: Diagnosis not present

## 2018-07-24 DIAGNOSIS — N186 End stage renal disease: Secondary | ICD-10-CM | POA: Diagnosis not present

## 2018-07-26 DIAGNOSIS — N2581 Secondary hyperparathyroidism of renal origin: Secondary | ICD-10-CM | POA: Diagnosis not present

## 2018-07-26 DIAGNOSIS — N186 End stage renal disease: Secondary | ICD-10-CM | POA: Diagnosis not present

## 2018-07-28 DIAGNOSIS — N186 End stage renal disease: Secondary | ICD-10-CM | POA: Diagnosis not present

## 2018-07-28 DIAGNOSIS — N2581 Secondary hyperparathyroidism of renal origin: Secondary | ICD-10-CM | POA: Diagnosis not present

## 2018-07-31 DIAGNOSIS — N186 End stage renal disease: Secondary | ICD-10-CM | POA: Diagnosis not present

## 2018-07-31 DIAGNOSIS — N2581 Secondary hyperparathyroidism of renal origin: Secondary | ICD-10-CM | POA: Diagnosis not present

## 2018-08-02 DIAGNOSIS — N2581 Secondary hyperparathyroidism of renal origin: Secondary | ICD-10-CM | POA: Diagnosis not present

## 2018-08-02 DIAGNOSIS — N186 End stage renal disease: Secondary | ICD-10-CM | POA: Diagnosis not present

## 2018-08-04 DIAGNOSIS — N2581 Secondary hyperparathyroidism of renal origin: Secondary | ICD-10-CM | POA: Diagnosis not present

## 2018-08-04 DIAGNOSIS — N186 End stage renal disease: Secondary | ICD-10-CM | POA: Diagnosis not present

## 2018-08-07 DIAGNOSIS — N2581 Secondary hyperparathyroidism of renal origin: Secondary | ICD-10-CM | POA: Diagnosis not present

## 2018-08-07 DIAGNOSIS — N186 End stage renal disease: Secondary | ICD-10-CM | POA: Diagnosis not present

## 2018-08-09 DIAGNOSIS — N186 End stage renal disease: Secondary | ICD-10-CM | POA: Diagnosis not present

## 2018-08-09 DIAGNOSIS — N2581 Secondary hyperparathyroidism of renal origin: Secondary | ICD-10-CM | POA: Diagnosis not present

## 2018-08-11 DIAGNOSIS — N186 End stage renal disease: Secondary | ICD-10-CM | POA: Diagnosis not present

## 2018-08-11 DIAGNOSIS — N2581 Secondary hyperparathyroidism of renal origin: Secondary | ICD-10-CM | POA: Diagnosis not present

## 2018-08-13 DIAGNOSIS — N2581 Secondary hyperparathyroidism of renal origin: Secondary | ICD-10-CM | POA: Diagnosis not present

## 2018-08-13 DIAGNOSIS — N186 End stage renal disease: Secondary | ICD-10-CM | POA: Diagnosis not present

## 2018-08-15 DIAGNOSIS — N2581 Secondary hyperparathyroidism of renal origin: Secondary | ICD-10-CM | POA: Diagnosis not present

## 2018-08-15 DIAGNOSIS — N186 End stage renal disease: Secondary | ICD-10-CM | POA: Diagnosis not present

## 2018-08-18 DIAGNOSIS — N2581 Secondary hyperparathyroidism of renal origin: Secondary | ICD-10-CM | POA: Diagnosis not present

## 2018-08-18 DIAGNOSIS — N186 End stage renal disease: Secondary | ICD-10-CM | POA: Diagnosis not present

## 2018-08-19 DIAGNOSIS — N186 End stage renal disease: Secondary | ICD-10-CM | POA: Diagnosis not present

## 2018-08-19 DIAGNOSIS — I129 Hypertensive chronic kidney disease with stage 1 through stage 4 chronic kidney disease, or unspecified chronic kidney disease: Secondary | ICD-10-CM | POA: Diagnosis not present

## 2018-08-19 DIAGNOSIS — Z992 Dependence on renal dialysis: Secondary | ICD-10-CM | POA: Diagnosis not present

## 2018-08-21 DIAGNOSIS — N2581 Secondary hyperparathyroidism of renal origin: Secondary | ICD-10-CM | POA: Diagnosis not present

## 2018-08-21 DIAGNOSIS — N186 End stage renal disease: Secondary | ICD-10-CM | POA: Diagnosis not present

## 2018-08-23 DIAGNOSIS — N2581 Secondary hyperparathyroidism of renal origin: Secondary | ICD-10-CM | POA: Diagnosis not present

## 2018-08-23 DIAGNOSIS — N186 End stage renal disease: Secondary | ICD-10-CM | POA: Diagnosis not present

## 2018-08-25 DIAGNOSIS — N2581 Secondary hyperparathyroidism of renal origin: Secondary | ICD-10-CM | POA: Diagnosis not present

## 2018-08-25 DIAGNOSIS — N186 End stage renal disease: Secondary | ICD-10-CM | POA: Diagnosis not present

## 2018-08-28 DIAGNOSIS — N186 End stage renal disease: Secondary | ICD-10-CM | POA: Diagnosis not present

## 2018-08-28 DIAGNOSIS — N2581 Secondary hyperparathyroidism of renal origin: Secondary | ICD-10-CM | POA: Diagnosis not present

## 2018-08-30 DIAGNOSIS — N186 End stage renal disease: Secondary | ICD-10-CM | POA: Diagnosis not present

## 2018-08-30 DIAGNOSIS — N2581 Secondary hyperparathyroidism of renal origin: Secondary | ICD-10-CM | POA: Diagnosis not present

## 2018-08-30 IMAGING — DX DG ANKLE COMPLETE 3+V*R*
3 series · 3 of 3 positions shown · non-contrast
Comparison: None.

CLINICAL DATA: Recent trip and fall with ankle pain, initial
encounter

EXAM:
RIGHT ANKLE - COMPLETE 3+ VIEW

[ankle ap]
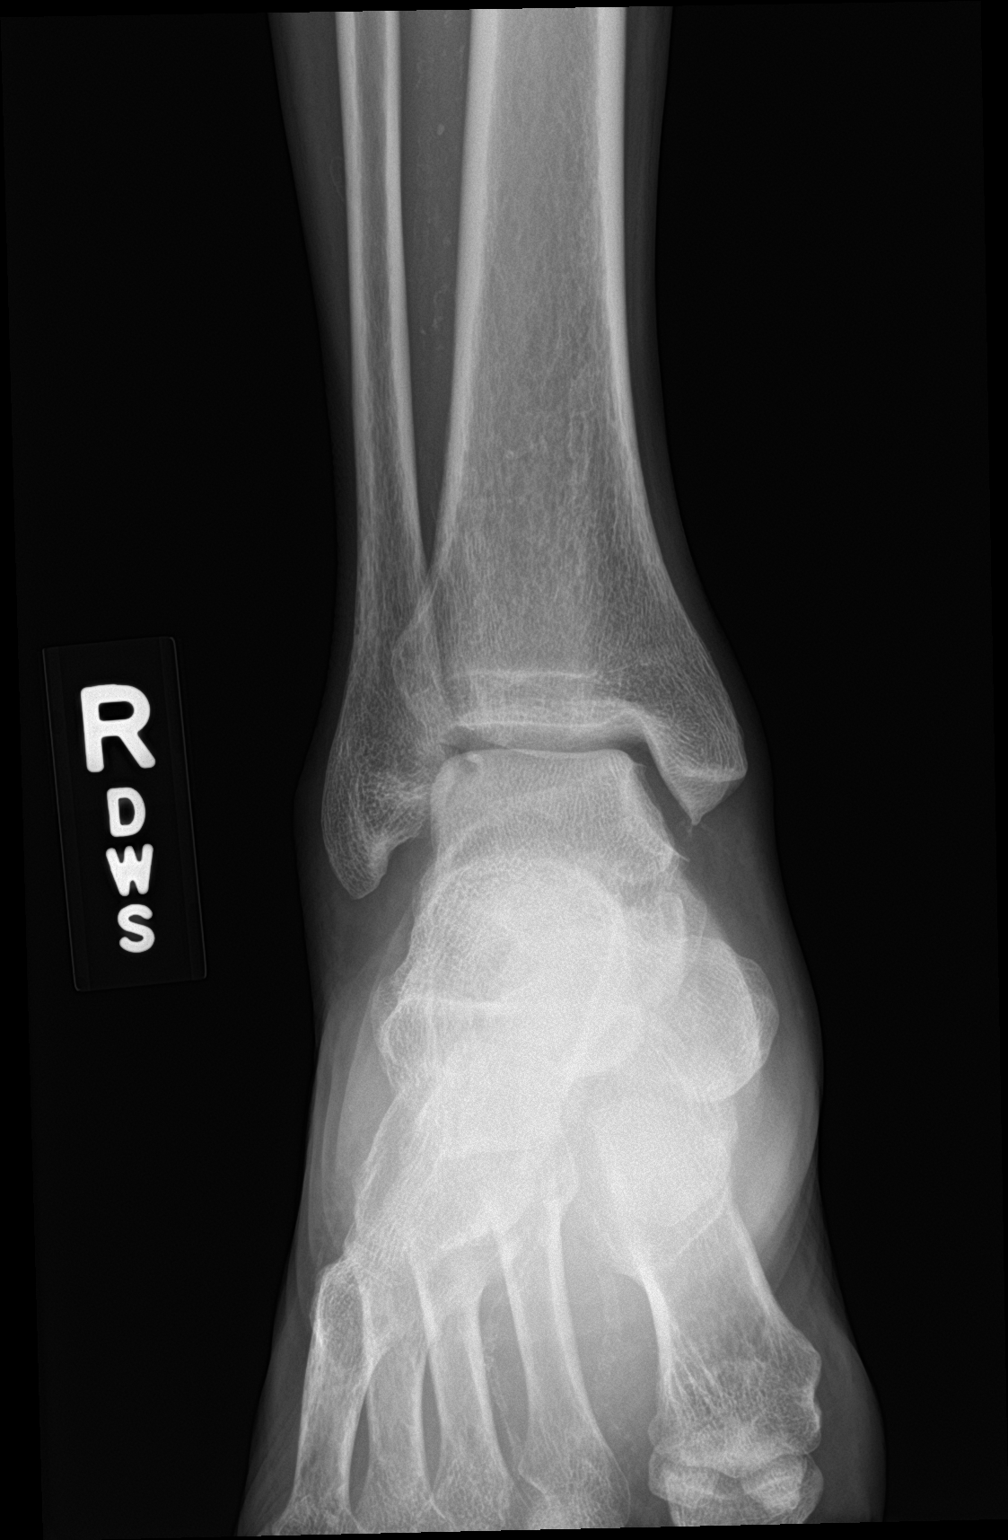

[ankle obl]
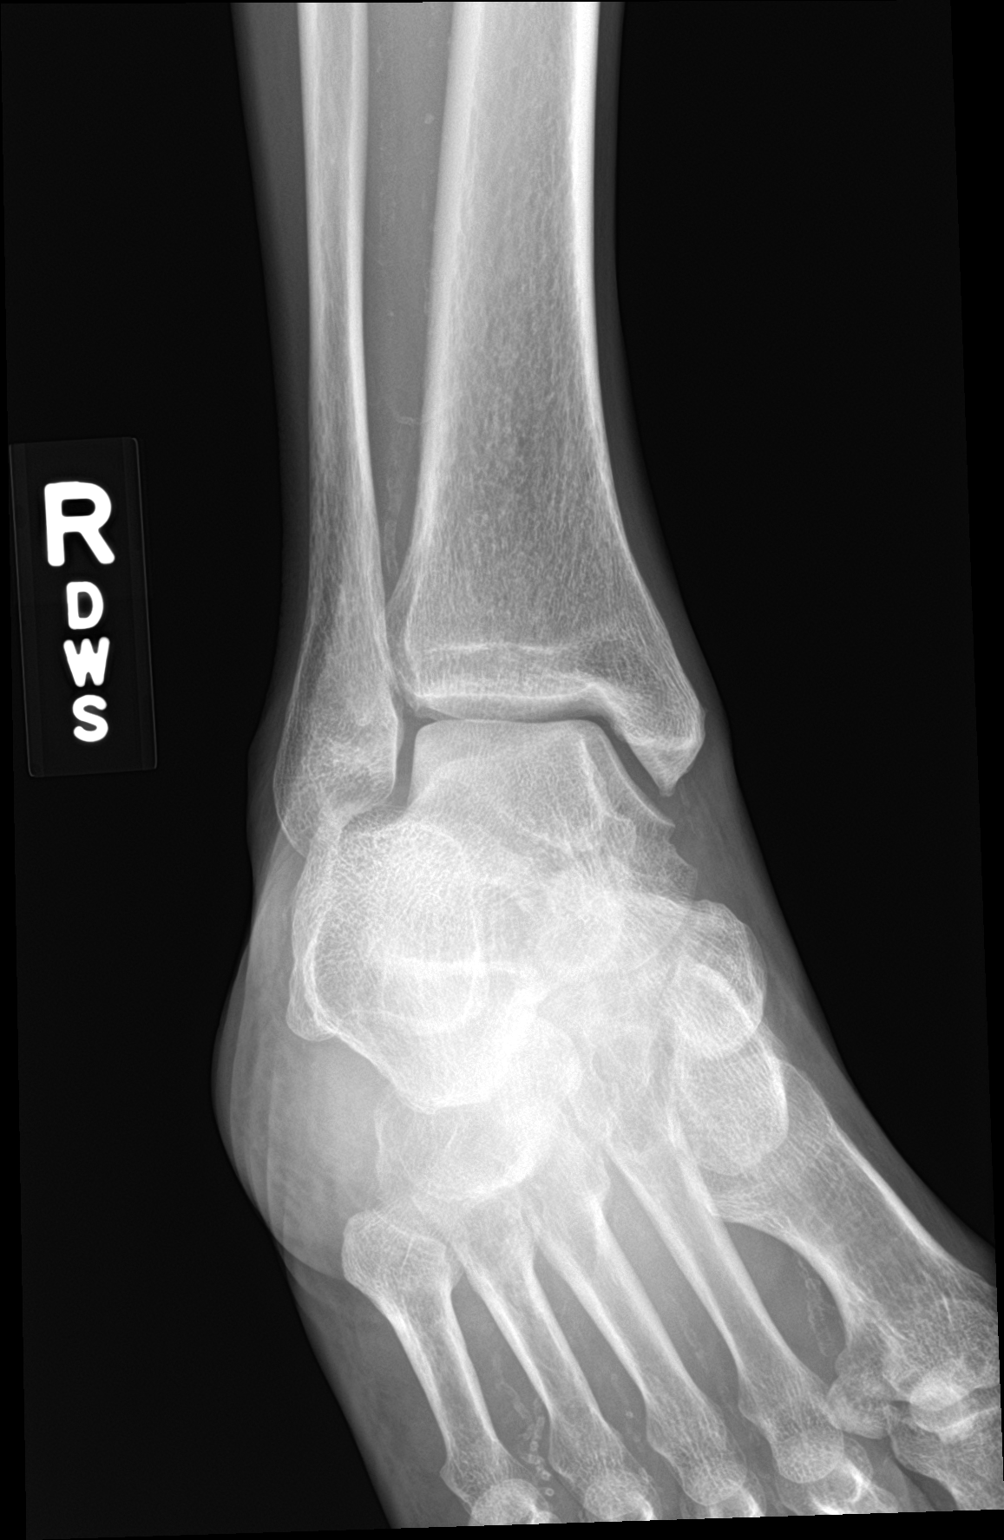

[ankle lat]
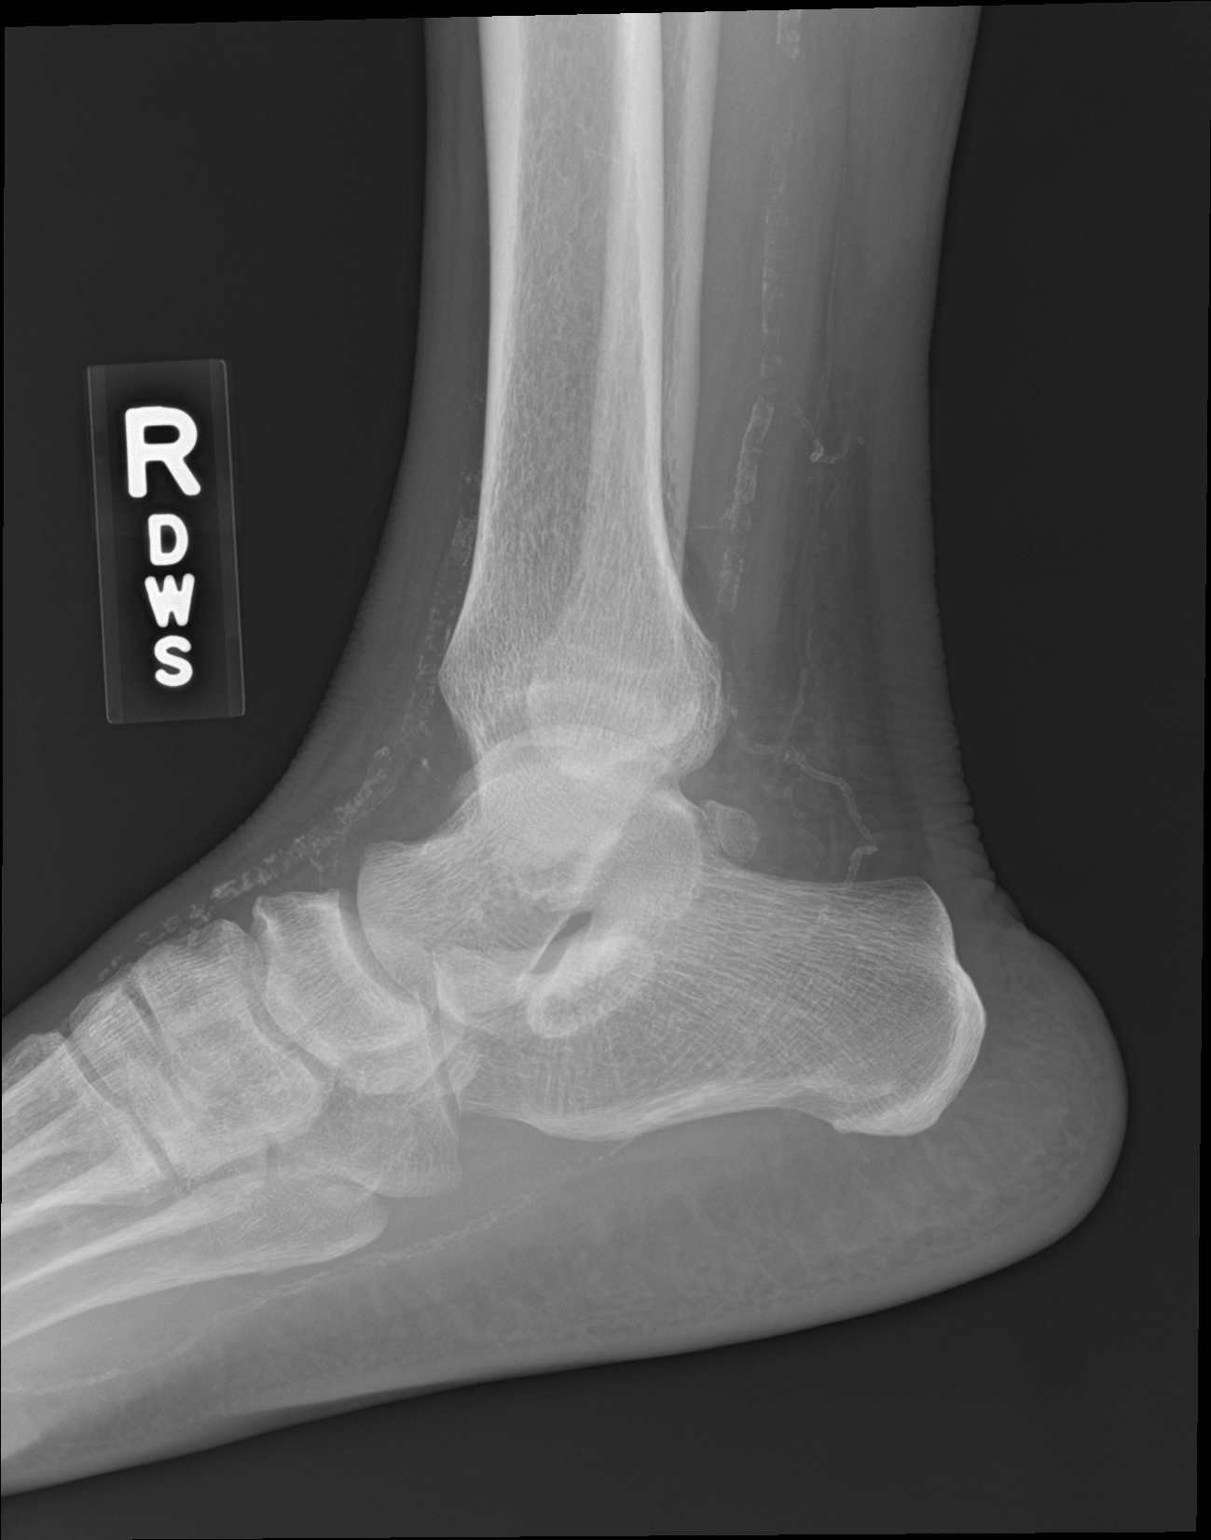

[3 of 3 positions shown; findings below may reference images not displayed]

FINDINGS: No acute fracture or dislocation is noted. Vascular calcifications
are seen. No significant soft tissue abnormality is noted. Mild
tarsal degenerative changes are seen.
IMPRESSION: No acute abnormality identified.

## 2018-09-01 DIAGNOSIS — N186 End stage renal disease: Secondary | ICD-10-CM | POA: Diagnosis not present

## 2018-09-01 DIAGNOSIS — N2581 Secondary hyperparathyroidism of renal origin: Secondary | ICD-10-CM | POA: Diagnosis not present

## 2018-09-04 DIAGNOSIS — N186 End stage renal disease: Secondary | ICD-10-CM | POA: Diagnosis not present

## 2018-09-04 DIAGNOSIS — N2581 Secondary hyperparathyroidism of renal origin: Secondary | ICD-10-CM | POA: Diagnosis not present

## 2018-09-06 DIAGNOSIS — N2581 Secondary hyperparathyroidism of renal origin: Secondary | ICD-10-CM | POA: Diagnosis not present

## 2018-09-06 DIAGNOSIS — N186 End stage renal disease: Secondary | ICD-10-CM | POA: Diagnosis not present

## 2018-09-08 DIAGNOSIS — N186 End stage renal disease: Secondary | ICD-10-CM | POA: Diagnosis not present

## 2018-09-08 DIAGNOSIS — N2581 Secondary hyperparathyroidism of renal origin: Secondary | ICD-10-CM | POA: Diagnosis not present

## 2018-09-10 DIAGNOSIS — N2581 Secondary hyperparathyroidism of renal origin: Secondary | ICD-10-CM | POA: Diagnosis not present

## 2018-09-10 DIAGNOSIS — N186 End stage renal disease: Secondary | ICD-10-CM | POA: Diagnosis not present

## 2018-09-12 DIAGNOSIS — N186 End stage renal disease: Secondary | ICD-10-CM | POA: Diagnosis not present

## 2018-09-12 DIAGNOSIS — N2581 Secondary hyperparathyroidism of renal origin: Secondary | ICD-10-CM | POA: Diagnosis not present

## 2018-09-15 DIAGNOSIS — N186 End stage renal disease: Secondary | ICD-10-CM | POA: Diagnosis not present

## 2018-09-15 DIAGNOSIS — N2581 Secondary hyperparathyroidism of renal origin: Secondary | ICD-10-CM | POA: Diagnosis not present

## 2018-09-17 DIAGNOSIS — N186 End stage renal disease: Secondary | ICD-10-CM | POA: Diagnosis not present

## 2018-09-17 DIAGNOSIS — N2581 Secondary hyperparathyroidism of renal origin: Secondary | ICD-10-CM | POA: Diagnosis not present

## 2018-09-19 DIAGNOSIS — I129 Hypertensive chronic kidney disease with stage 1 through stage 4 chronic kidney disease, or unspecified chronic kidney disease: Secondary | ICD-10-CM | POA: Diagnosis not present

## 2018-09-19 DIAGNOSIS — N186 End stage renal disease: Secondary | ICD-10-CM | POA: Diagnosis not present

## 2018-09-19 DIAGNOSIS — N2581 Secondary hyperparathyroidism of renal origin: Secondary | ICD-10-CM | POA: Diagnosis not present

## 2018-09-19 DIAGNOSIS — Z992 Dependence on renal dialysis: Secondary | ICD-10-CM | POA: Diagnosis not present

## 2018-12-04 ENCOUNTER — Other Ambulatory Visit: Payer: Self-pay

## 2018-12-04 ENCOUNTER — Encounter (HOSPITAL_BASED_OUTPATIENT_CLINIC_OR_DEPARTMENT_OTHER): Payer: 59 | Attending: Internal Medicine

## 2018-12-04 DIAGNOSIS — Z794 Long term (current) use of insulin: Secondary | ICD-10-CM | POA: Insufficient documentation

## 2018-12-04 DIAGNOSIS — Z992 Dependence on renal dialysis: Secondary | ICD-10-CM | POA: Diagnosis not present

## 2018-12-04 DIAGNOSIS — N186 End stage renal disease: Secondary | ICD-10-CM | POA: Diagnosis not present

## 2018-12-04 DIAGNOSIS — E1122 Type 2 diabetes mellitus with diabetic chronic kidney disease: Secondary | ICD-10-CM | POA: Insufficient documentation

## 2018-12-04 DIAGNOSIS — L97429 Non-pressure chronic ulcer of left heel and midfoot with unspecified severity: Secondary | ICD-10-CM | POA: Insufficient documentation

## 2018-12-04 DIAGNOSIS — I120 Hypertensive chronic kidney disease with stage 5 chronic kidney disease or end stage renal disease: Secondary | ICD-10-CM | POA: Diagnosis not present

## 2018-12-04 DIAGNOSIS — E11621 Type 2 diabetes mellitus with foot ulcer: Secondary | ICD-10-CM | POA: Diagnosis not present

## 2018-12-12 DIAGNOSIS — E11621 Type 2 diabetes mellitus with foot ulcer: Secondary | ICD-10-CM | POA: Diagnosis not present

## 2019-08-10 ENCOUNTER — Other Ambulatory Visit: Payer: Self-pay

## 2019-08-10 ENCOUNTER — Ambulatory Visit (INDEPENDENT_AMBULATORY_CARE_PROVIDER_SITE_OTHER)
Admission: EM | Admit: 2019-08-10 | Discharge: 2019-08-10 | Disposition: A | Discharge status: Home / Self Care | Payer: Medicare Other | Source: Home / Self Care

## 2019-08-10 ENCOUNTER — Encounter (HOSPITAL_COMMUNITY): Payer: Self-pay

## 2019-08-10 DIAGNOSIS — Z20828 Contact with and (suspected) exposure to other viral communicable diseases: Secondary | ICD-10-CM

## 2019-08-10 DIAGNOSIS — Z20822 Contact with and (suspected) exposure to covid-19: Secondary | ICD-10-CM

## 2019-08-10 DIAGNOSIS — U071 COVID-19: Secondary | ICD-10-CM | POA: Insufficient documentation

## 2019-08-10 DIAGNOSIS — R0602 Shortness of breath: Secondary | ICD-10-CM | POA: Diagnosis not present

## 2019-08-10 DIAGNOSIS — R509 Fever, unspecified: Secondary | ICD-10-CM

## 2019-08-10 DIAGNOSIS — I10 Essential (primary) hypertension: Secondary | ICD-10-CM | POA: Diagnosis not present

## 2019-08-10 DIAGNOSIS — R05 Cough: Secondary | ICD-10-CM

## 2019-08-10 NOTE — ED Provider Notes (Signed)
[place]    CSN: [id number] Arrival date & time: 08/10/19  1201      History   Chief Complaint Chief Complaint  Patient presents with  . Fever    HPI Xaiver Horton is a 58 y.o. male.   Jonathan Horton presents with complaints of fever. He was at the allergist today and temp was 101.3. he went home and temp was 97.4.  He went to [facility] to get his medicine and it was 97.4. he otherwise feels well. He has occasional cough, since yesterday. No shortness of breath . No runny nose. No headache or body aches. No sore throat. No loss of taste or smell. No nausea vomiting or diarrhea. Hasn't taken any tylenol or ibuprofen. No known ill contacts. History of dm, ckd, htn.     ROS per HPI, negative if not otherwise mentioned.      Past Medical History:  Diagnosis Date  . Anemia   . Chronic kidney disease   . Diabetes mellitus without complication ([place])   . History of blood transfusion 05/2016  . Hypertension     Patient Active Problem List   Diagnosis Date Noted  . Uremia 06/12/2017  . Hyperkalemia 06/12/2017  . Abdominal pain 06/12/2017  . Special screening for malignant neoplasms, colon 12/23/2016  . Anemia 06/10/2016  . ESRD on dialysis ([place]) 06/10/2016  . Anemia of chronic kidney failure 06/10/2016  . S/P dialysis catheter insertion ([place])   . Renal failure 06/07/2016  . Acute renal failure (ARF) ([place]) 06/07/2016  . Essential hypertension 06/07/2016  . Diabetes mellitus with end stage renal disease ([place]) 06/07/2016    Past Surgical History:  Procedure Laterality Date  . AV FISTULA PLACEMENT Left 06/08/2016   Procedure: ARTERIOVENOUS (AV) FISTULA CREATION;  Surgeon: Angelia Mould, MD;  Location: [facility];  Service: Vascular;  Laterality: Left;  . INSERTION OF DIALYSIS CATHETER Right 06/08/2016   Procedure: INSERTION OF DIALYSIS CATHETER;  Surgeon: Angelia Mould, MD;  Location: [facility];  Service: Vascular;  Laterality: Right;       Home  Medications    Prior to Admission medications   Medication Sig Start Date End Date Taking? Authorizing Provider  aspirin EC 81 MG tablet Take 81 mg by mouth daily.   Yes [provider]  amLODipine (NORVASC) 10 MG tablet Take 10 mg by mouth daily. 05/01/16   [provider]  carvedilol (COREG) 25 MG tablet Take 25 mg by mouth 2 (two) times daily. 05/01/16   [provider]  cloNIDine (CATAPRES) 0.1 MG tablet Take 0.1 mg by mouth daily.  01/03/14   [provider]  multivitamin (RENA-VIT) TABS tablet Take 1 tablet by mouth at bedtime. 06/10/16   Dhungel, Flonnie Overman, MD  traMADol (ULTRAM) 50 MG tablet Take 50 mg by mouth every 6 (six) hours as needed for moderate pain.  01/08/14   [provider]  VELPHORO 500 MG chewable tablet Chew 500 mg by mouth 3 (three) times daily with meals.  11/25/16   [provider]    Family History Family History  Problem Relation Age of Onset  . Healthy Mother   . Healthy Father   . Colon cancer Neg Hx   . Esophageal cancer Neg Hx   . Rectal cancer Neg Hx   . Stomach cancer Neg Hx     Social History Social History   Tobacco Use  . Smoking status: Never Smoker  . Smokeless tobacco: Never Used  Substance Use Topics  .  Alcohol use: No  . Drug use: No     Allergies   Patient has no known allergies.   Review of Systems Review of Systems   Physical Exam Triage Vital Signs ED Triage Vitals  Enc Vitals Group     BP 08/10/19 1259 (!) 157/84     Pulse Rate 08/10/19 1259 98     Resp 08/10/19 1259 16     Temp 08/10/19 1259 99.9 F (37.7 C)     Temp Source 08/10/19 1259 Oral     SpO2 08/10/19 1259 100 %     Weight --      Height --      Head Circumference --      Peak Flow --      Pain Score 08/10/19 1300 0     Pain Loc --      Pain Edu? --      Excl. in [place]? --    No data found.  Updated Vital Signs BP (!) 157/84 (BP Location: Right Arm)   Pulse 98   Temp 99.9 F (37.7 C) (Oral)   Resp  16   SpO2 100%    Physical Exam Vitals signs (recheck oral temp of 99.8 during my exam ) reviewed.  Constitutional:      Appearance: He is well-developed.  Cardiovascular:     Rate and Rhythm: Normal rate.  Pulmonary:     Effort: Pulmonary effort is normal.  Skin:    General: Skin is warm and dry.  Neurological:     Mental Status: He is alert and oriented to person, place, and time.      UC Treatments / Results  Labs (all labs ordered are listed, but only abnormal results are displayed) Labs Reviewed  SARS CORONAVIRUS 2 (TAT 6-24 HRS)    EKG   Radiology No results found.  Procedures Procedures (including critical care time)  Medications Ordered in UC Medications - No data to display  Initial Impression / Assessment and Plan / UC Course  I have reviewed the triage vital signs and the nursing notes.  Pertinent labs & imaging results that were available during my care of the patient were reviewed by me and considered in my medical decision making (see chart for details).     Patient without complaints today, feels well. Temp of 101.3 at allergist today, however, therefore was recommended to seek evaluation. Afebrile here today. covid send out collected and pending. Will notify of any positive findings and if any changes to treatment are needed.  Return precautions provided. Patient verbalized understanding and agreeable to plan.    Final Clinical Impressions(s) / UC Diagnoses   Final diagnoses:  Encounter for laboratory testing for COVID-19 virus  Fever, unspecified fever cause     Discharge Instructions     Self isolate until covid results are back and negative.  Will notify you by phone of any positive findings. Your negative results will be sent through your MyChart.     Tylenol or ibuprofen as needed for fever. Continue to monitor for additional symptoms- please return or go to the ER if any worsening or further concerned     ED Prescriptions    None      PDMP not reviewed this encounter.   Zigmund Gottron, NP 08/10/19 1420

## 2019-08-10 NOTE — Discharge Instructions (Signed)
Self isolate until covid results are back and negative.  Will notify you by phone of any positive findings. Your negative results will be sent through your MyChart.     Tylenol or ibuprofen as needed for fever. Continue to monitor for additional symptoms- please return or go to the ER if any worsening or further concerned

## 2019-08-10 NOTE — ED Triage Notes (Signed)
Pt presents to UC w/ c/o fever of 101.3 at clinic today. Pt states he does not feel that he has a fever. Has not had any tylenol or ibuprofen today

## 2019-08-11 ENCOUNTER — Inpatient Hospital Stay (HOSPITAL_COMMUNITY): Payer: Medicare Other

## 2019-08-11 ENCOUNTER — Inpatient Hospital Stay (HOSPITAL_COMMUNITY)
Admission: EM | Admit: 2019-08-11 | Discharge: 2019-08-21 | DRG: 207 | Disposition: E | Payer: Medicare Other | Attending: Internal Medicine | Admitting: Internal Medicine

## 2019-08-11 ENCOUNTER — Emergency Department (HOSPITAL_COMMUNITY): Payer: Medicare Other

## 2019-08-11 DIAGNOSIS — E871 Hypo-osmolality and hyponatremia: Secondary | ICD-10-CM | POA: Diagnosis not present

## 2019-08-11 DIAGNOSIS — J811 Chronic pulmonary edema: Secondary | ICD-10-CM | POA: Diagnosis present

## 2019-08-11 DIAGNOSIS — J9690 Respiratory failure, unspecified, unspecified whether with hypoxia or hypercapnia: Secondary | ICD-10-CM

## 2019-08-11 DIAGNOSIS — E875 Hyperkalemia: Secondary | ICD-10-CM | POA: Diagnosis present

## 2019-08-11 DIAGNOSIS — E8770 Fluid overload, unspecified: Secondary | ICD-10-CM | POA: Diagnosis present

## 2019-08-11 DIAGNOSIS — E1122 Type 2 diabetes mellitus with diabetic chronic kidney disease: Secondary | ICD-10-CM | POA: Diagnosis present

## 2019-08-11 DIAGNOSIS — J9621 Acute and chronic respiratory failure with hypoxia: Secondary | ICD-10-CM | POA: Diagnosis present

## 2019-08-11 DIAGNOSIS — T380X5A Adverse effect of glucocorticoids and synthetic analogues, initial encounter: Secondary | ICD-10-CM | POA: Diagnosis not present

## 2019-08-11 DIAGNOSIS — U071 COVID-19: Secondary | ICD-10-CM | POA: Diagnosis present

## 2019-08-11 DIAGNOSIS — E1165 Type 2 diabetes mellitus with hyperglycemia: Secondary | ICD-10-CM | POA: Diagnosis not present

## 2019-08-11 DIAGNOSIS — I120 Hypertensive chronic kidney disease with stage 5 chronic kidney disease or end stage renal disease: Secondary | ICD-10-CM | POA: Diagnosis present

## 2019-08-11 DIAGNOSIS — G9341 Metabolic encephalopathy: Secondary | ICD-10-CM | POA: Diagnosis present

## 2019-08-11 DIAGNOSIS — I248 Other forms of acute ischemic heart disease: Secondary | ICD-10-CM | POA: Diagnosis present

## 2019-08-11 DIAGNOSIS — J9601 Acute respiratory failure with hypoxia: Secondary | ICD-10-CM

## 2019-08-11 DIAGNOSIS — I4891 Unspecified atrial fibrillation: Secondary | ICD-10-CM | POA: Diagnosis not present

## 2019-08-11 DIAGNOSIS — Z9289 Personal history of other medical treatment: Secondary | ICD-10-CM

## 2019-08-11 DIAGNOSIS — Z79899 Other long term (current) drug therapy: Secondary | ICD-10-CM

## 2019-08-11 DIAGNOSIS — D6489 Other specified anemias: Secondary | ICD-10-CM | POA: Diagnosis present

## 2019-08-11 DIAGNOSIS — Z992 Dependence on renal dialysis: Secondary | ICD-10-CM

## 2019-08-11 DIAGNOSIS — R778 Other specified abnormalities of plasma proteins: Secondary | ICD-10-CM

## 2019-08-11 DIAGNOSIS — J069 Acute upper respiratory infection, unspecified: Secondary | ICD-10-CM | POA: Diagnosis present

## 2019-08-11 DIAGNOSIS — D631 Anemia in chronic kidney disease: Secondary | ICD-10-CM | POA: Diagnosis present

## 2019-08-11 DIAGNOSIS — N186 End stage renal disease: Secondary | ICD-10-CM | POA: Diagnosis present

## 2019-08-11 DIAGNOSIS — R0602 Shortness of breath: Secondary | ICD-10-CM | POA: Diagnosis present

## 2019-08-11 DIAGNOSIS — R7989 Other specified abnormal findings of blood chemistry: Secondary | ICD-10-CM

## 2019-08-11 DIAGNOSIS — J1289 Other viral pneumonia: Secondary | ICD-10-CM | POA: Diagnosis present

## 2019-08-11 DIAGNOSIS — I469 Cardiac arrest, cause unspecified: Secondary | ICD-10-CM | POA: Diagnosis not present

## 2019-08-11 DIAGNOSIS — J80 Acute respiratory distress syndrome: Secondary | ICD-10-CM | POA: Diagnosis present

## 2019-08-11 DIAGNOSIS — I10 Essential (primary) hypertension: Secondary | ICD-10-CM | POA: Diagnosis present

## 2019-08-11 DIAGNOSIS — J1282 Pneumonia due to coronavirus disease 2019: Secondary | ICD-10-CM | POA: Diagnosis present

## 2019-08-11 DIAGNOSIS — Z7982 Long term (current) use of aspirin: Secondary | ICD-10-CM

## 2019-08-11 DIAGNOSIS — K567 Ileus, unspecified: Secondary | ICD-10-CM

## 2019-08-11 DIAGNOSIS — Z978 Presence of other specified devices: Secondary | ICD-10-CM

## 2019-08-11 LAB — MRSA PCR SCREENING: MRSA by PCR: NEGATIVE

## 2019-08-11 LAB — GLUCOSE, CAPILLARY
Glucose-Capillary: 134 mg/dL — ABNORMAL HIGH (ref 70–99)
Glucose-Capillary: 89 mg/dL (ref 70–99)

## 2019-08-11 LAB — TROPONIN I (HIGH SENSITIVITY)
Troponin I (High Sensitivity): 101 ng/L (ref <18)
Troponin I (High Sensitivity): 79 ng/L — ABNORMAL HIGH (ref <18)

## 2019-08-11 LAB — COMPREHENSIVE METABOLIC PANEL
ALT: 12 U/L (ref 0–44)
AST: 19 U/L (ref 15–41)
Albumin: 3.2 g/dL — ABNORMAL LOW (ref 3.5–5.0)
Alkaline Phosphatase: 56 U/L (ref 38–126)
Anion gap: 16 — ABNORMAL HIGH (ref 5–15)
BUN: 95 mg/dL — ABNORMAL HIGH (ref 6–20)
CO2: 25 mmol/L (ref 22–32)
Calcium: 9.2 mg/dL (ref 8.9–10.3)
Chloride: 89 mmol/L — ABNORMAL LOW (ref 98–111)
Creatinine, Ser: 18.72 mg/dL — ABNORMAL HIGH (ref 0.61–1.24)
GFR calc Af Amer: 3 mL/min — ABNORMAL LOW (ref >60)
GFR calc non Af Amer: 2 mL/min — ABNORMAL LOW (ref >60)
Glucose, Bld: 52 mg/dL — ABNORMAL LOW (ref 70–99)
Potassium: 7.1 mmol/L (ref 3.5–5.1)
Sodium: 130 mmol/L — ABNORMAL LOW (ref 135–145)
Total Bilirubin: 0.9 mg/dL (ref 0.3–1.2)
Total Protein: 7.4 g/dL (ref 6.5–8.1)

## 2019-08-11 LAB — POCT I-STAT 7, (LYTES, BLD GAS, ICA,H+H)
Acid-Base Excess: 2 mmol/L (ref 0.0–2.0)
Acid-base deficit: 1 mmol/L (ref 0.0–2.0)
Bicarbonate: 27.1 mmol/L (ref 20.0–28.0)
Bicarbonate: 28.2 mmol/L — ABNORMAL HIGH (ref 20.0–28.0)
Calcium, Ion: 1.12 mmol/L — ABNORMAL LOW (ref 1.15–1.40)
Calcium, Ion: 1.1 mmol/L — ABNORMAL LOW (ref 1.15–1.40)
HCT: 36 % — ABNORMAL LOW (ref 39.0–52.0)
HCT: 37 % — ABNORMAL LOW (ref 39.0–52.0)
Hemoglobin: 12.2 g/dL — ABNORMAL LOW (ref 13.0–17.0)
Hemoglobin: 12.6 g/dL — ABNORMAL LOW (ref 13.0–17.0)
O2 Saturation: 92 %
O2 Saturation: 100 %
Patient temperature: 98.6
Patient temperature: 98.6
Potassium: >8.5 mmol/L (ref 3.5–5.1)
Potassium: 5.2 mmol/L — ABNORMAL HIGH (ref 3.5–5.1)
Sodium: 122 mmol/L — ABNORMAL LOW (ref 135–145)
Sodium: 132 mmol/L — ABNORMAL LOW (ref 135–145)
TCO2: 29 mmol/L (ref 22–32)
TCO2: 30 mmol/L (ref 22–32)
pCO2 arterial: 62 mmHg — ABNORMAL HIGH (ref 32.0–48.0)
pCO2 arterial: 49.1 mmHg — ABNORMAL HIGH (ref 32.0–48.0)
pH, Arterial: 7.248 — ABNORMAL LOW (ref 7.350–7.450)
pH, Arterial: 7.368 (ref 7.350–7.450)
pO2, Arterial: 76 mmHg — ABNORMAL LOW (ref 83.0–108.0)
pO2, Arterial: 211 mmHg — ABNORMAL HIGH (ref 83.0–108.0)

## 2019-08-11 LAB — CBC WITH DIFFERENTIAL/PLATELET
Abs Immature Granulocytes: 0.03 10*3/uL (ref 0.00–0.07)
Basophils Absolute: 0 10*3/uL (ref 0.0–0.1)
Basophils Relative: 0 %
Eosinophils Absolute: 0 10*3/uL (ref 0.0–0.5)
Eosinophils Relative: 0 %
HCT: 32.8 % — ABNORMAL LOW (ref 39.0–52.0)
Hemoglobin: 10.9 g/dL — ABNORMAL LOW (ref 13.0–17.0)
Immature Granulocytes: 0 %
Lymphocytes Relative: 10 %
Lymphs Abs: 0.7 10*3/uL (ref 0.7–4.0)
MCH: 32.9 pg (ref 26.0–34.0)
MCHC: 33.2 g/dL (ref 30.0–36.0)
MCV: 99.1 fL (ref 80.0–100.0)
Monocytes Absolute: 0.4 10*3/uL (ref 0.1–1.0)
Monocytes Relative: 6 %
Neutro Abs: 5.9 10*3/uL (ref 1.7–7.7)
Neutrophils Relative %: 84 %
Platelets: 298 10*3/uL (ref 150–400)
RBC: 3.31 MIL/uL — ABNORMAL LOW (ref 4.22–5.81)
RDW: 12.4 % (ref 11.5–15.5)
WBC: 7.1 10*3/uL (ref 4.0–10.5)
nRBC: 0 % (ref 0.0–0.2)

## 2019-08-11 LAB — LACTATE DEHYDROGENASE: LDH: 207 U/L — ABNORMAL HIGH (ref 98–192)

## 2019-08-11 LAB — C-REACTIVE PROTEIN: CRP: 10.3 mg/dL — ABNORMAL HIGH (ref <1.0)

## 2019-08-11 LAB — LACTIC ACID, PLASMA
Lactic Acid, Venous: 1.5 mmol/L (ref 0.5–1.9)
Lactic Acid, Venous: 2 mmol/L (ref 0.5–1.9)

## 2019-08-11 LAB — POC SARS CORONAVIRUS 2 AG -  ED: SARS Coronavirus 2 Ag: POSITIVE — AB

## 2019-08-11 LAB — D-DIMER, QUANTITATIVE: D-Dimer, Quant: 0.58 ug/mL-FEU — ABNORMAL HIGH (ref 0.00–0.50)

## 2019-08-11 LAB — FIBRINOGEN: Fibrinogen: 620 mg/dL — ABNORMAL HIGH (ref 210–475)

## 2019-08-11 LAB — HIV ANTIBODY (ROUTINE TESTING W REFLEX): HIV Screen 4th Generation wRfx: NONREACTIVE

## 2019-08-11 LAB — FERRITIN: Ferritin: 1544 ng/mL — ABNORMAL HIGH (ref 24–336)

## 2019-08-11 LAB — CBG MONITORING, ED: Glucose-Capillary: 99 mg/dL (ref 70–99)

## 2019-08-11 LAB — TRIGLYCERIDES: Triglycerides: 94 mg/dL (ref <150)

## 2019-08-11 LAB — PROCALCITONIN: Procalcitonin: 0.55 ng/mL

## 2019-08-11 MED ORDER — FENTANYL CITRATE (PF) 100 MCG/2ML IJ SOLN
50.0000 ug | INTRAMUSCULAR | Status: DC | PRN
  Administered 2019-08-11: 50 ug via INTRAVENOUS

## 2019-08-11 MED ORDER — ALBUTEROL SULFATE (2.5 MG/3ML) 0.083% IN NEBU
INHALATION_SOLUTION | RESPIRATORY_TRACT | Status: AC
  Administered 2019-08-11: 21:00:00 via RESPIRATORY_TRACT
  Filled 2019-08-11: qty 3

## 2019-08-11 MED ORDER — ONDANSETRON HCL 4 MG PO TABS: 4.0000 mg | ORAL_TABLET | Freq: Four times a day (QID) | ORAL | Status: DC | PRN

## 2019-08-11 MED ORDER — FENTANYL CITRATE (PF) 100 MCG/2ML IJ SOLN: 50.0000 ug | INTRAMUSCULAR | Status: DC | PRN

## 2019-08-11 MED ORDER — INSULIN ASPART 100 UNIT/ML IV SOLN: 5.0000 [IU] | Freq: Once | INTRAVENOUS | Status: DC

## 2019-08-11 MED ORDER — ADULT MULTIVITAMIN LIQUID CH
15.0000 mL | Freq: Every day | ORAL | Status: DC
  Administered 2019-08-12 – 2019-08-16 (×5): 15 mL
  Filled 2019-08-11 (×7): qty 15

## 2019-08-11 MED ORDER — METHYLPREDNISOLONE SODIUM SUCC 125 MG IJ SOLR
0.5000 mg/kg | Freq: Two times a day (BID) | INTRAMUSCULAR | Status: DC
  Administered 2019-08-11 – 2019-08-15 (×10): 40.625 mg via INTRAVENOUS
  Filled 2019-08-11 (×11): qty 2

## 2019-08-11 MED ORDER — DOXERCALCIFEROL 4 MCG/2ML IV SOLN
7.0000 ug | INTRAVENOUS | Status: DC
  Administered 2019-08-14: 7 ug via INTRAVENOUS
  Filled 2019-08-11 (×3): qty 4

## 2019-08-11 MED ORDER — FENTANYL 2500MCG IN NS 250ML (10MCG/ML) PREMIX INFUSION
0.0000 ug/h | INTRAVENOUS | Status: DC
  Administered 2019-08-11 – 2019-08-12 (×2): 50 ug/h via INTRAVENOUS
  Administered 2019-08-14 (×2): 150 ug/h via INTRAVENOUS
  Administered 2019-08-15 – 2019-08-16 (×2): 200 ug/h via INTRAVENOUS
  Filled 2019-08-11 (×7): qty 250

## 2019-08-11 MED ORDER — HEPARIN SODIUM (PORCINE) 1000 UNIT/ML IJ SOLN
INTRAMUSCULAR | Status: AC
  Filled 2019-08-11: qty 2

## 2019-08-11 MED ORDER — DOXERCALCIFEROL 4 MCG/2ML IV SOLN
INTRAVENOUS | Status: AC
  Filled 2019-08-11: qty 4

## 2019-08-11 MED ORDER — AMLODIPINE BESYLATE 5 MG PO TABS: 10.0000 mg | ORAL_TABLET | Freq: Every day | ORAL | Status: DC

## 2019-08-11 MED ORDER — ONDANSETRON HCL 4 MG/2ML IJ SOLN: 4.0000 mg | Freq: Four times a day (QID) | INTRAMUSCULAR | Status: DC | PRN

## 2019-08-11 MED ORDER — PROPOFOL 1000 MG/100ML IV EMUL
0.0000 ug/kg/min | INTRAVENOUS | Status: DC
  Administered 2019-08-11: 5 ug/kg/min via INTRAVENOUS
  Administered 2019-08-11: 40 ug/kg/min via INTRAVENOUS
  Administered 2019-08-11: 50 ug/kg/min via INTRAVENOUS
  Administered 2019-08-12: 30 ug/kg/min via INTRAVENOUS
  Administered 2019-08-12: 50 ug/kg/min via INTRAVENOUS
  Administered 2019-08-12: 20 ug/kg/min via INTRAVENOUS
  Administered 2019-08-13: 22 ug/kg/min via INTRAVENOUS
  Administered 2019-08-13: 40 ug/kg/min via INTRAVENOUS
  Administered 2019-08-13: 30 ug/kg/min via INTRAVENOUS
  Administered 2019-08-14 (×2): 35 ug/kg/min via INTRAVENOUS
  Administered 2019-08-14: 40 ug/kg/min via INTRAVENOUS
  Administered 2019-08-14: 25.01 ug/kg/min via INTRAVENOUS
  Administered 2019-08-15: 25 ug/kg/min via INTRAVENOUS
  Administered 2019-08-15 (×2): 30 ug/kg/min via INTRAVENOUS
  Administered 2019-08-15: 50 ug/kg/min via INTRAVENOUS
  Administered 2019-08-15: 30 ug/kg/min via INTRAVENOUS
  Administered 2019-08-16: 35 ug/kg/min via INTRAVENOUS
  Filled 2019-08-11 (×20): qty 100
  Filled 2019-08-11: qty 200

## 2019-08-11 MED ORDER — ALBUTEROL SULFATE (2.5 MG/3ML) 0.083% IN NEBU
2.5000 mg | INHALATION_SOLUTION | Freq: Four times a day (QID) | RESPIRATORY_TRACT | Status: DC
  Administered 2019-08-11: 21:00:00 via RESPIRATORY_TRACT
  Administered 2019-08-11 – 2019-08-13 (×7): 2.5 mg via RESPIRATORY_TRACT
  Filled 2019-08-11 (×7): qty 3

## 2019-08-11 MED ORDER — ORAL CARE MOUTH RINSE
15.0000 mL | OROMUCOSAL | Status: DC
  Administered 2019-08-11 – 2019-08-16 (×46): 15 mL via OROMUCOSAL

## 2019-08-11 MED ORDER — ZOLPIDEM TARTRATE 5 MG PO TABS: 5.0000 mg | ORAL_TABLET | Freq: Every evening | ORAL | Status: DC | PRN

## 2019-08-11 MED ORDER — SODIUM CHLORIDE 0.9 % IV SOLN: 250.0000 mL | INTRAVENOUS | Status: DC | PRN

## 2019-08-11 MED ORDER — CHLORHEXIDINE GLUCONATE CLOTH 2 % EX PADS
6.0000 | MEDICATED_PAD | Freq: Every day | CUTANEOUS | Status: DC
  Administered 2019-08-11 – 2019-08-15 (×6): 6 via TOPICAL

## 2019-08-11 MED ORDER — ACETAMINOPHEN 325 MG PO TABS: 650.0000 mg | ORAL_TABLET | Freq: Four times a day (QID) | ORAL | Status: DC | PRN

## 2019-08-11 MED ORDER — PRO-STAT SUGAR FREE PO LIQD
30.0000 mL | Freq: Two times a day (BID) | ORAL | Status: DC
  Administered 2019-08-11 – 2019-08-13 (×5): 30 mL
  Filled 2019-08-11 (×5): qty 30

## 2019-08-11 MED ORDER — ROCURONIUM BROMIDE 50 MG/5ML IV SOLN
INTRAVENOUS | Status: AC | PRN
  Administered 2019-08-11: 50 mg via INTRAVENOUS

## 2019-08-11 MED ORDER — SODIUM ZIRCONIUM CYCLOSILICATE 10 G PO PACK
10.0000 g | PACK | Freq: Three times a day (TID) | ORAL | Status: DC
  Administered 2019-08-11 – 2019-08-12 (×5): 10 g via ORAL
  Filled 2019-08-11 (×6): qty 1

## 2019-08-11 MED ORDER — SODIUM CHLORIDE 0.9% FLUSH: 3.0000 mL | INTRAVENOUS | Status: DC | PRN

## 2019-08-11 MED ORDER — NOREPINEPHRINE 4 MG/250ML-% IV SOLN
INTRAVENOUS | Status: AC
  Administered 2019-08-11: 15 ug/min via INTRAVENOUS
  Filled 2019-08-11: qty 250

## 2019-08-11 MED ORDER — ALBUMIN HUMAN 25 % IV SOLN
INTRAVENOUS | Status: AC
  Administered 2019-08-11: 25 g via INTRAVENOUS
  Filled 2019-08-11: qty 100

## 2019-08-11 MED ORDER — FENTANYL CITRATE (PF) 100 MCG/2ML IJ SOLN
50.0000 ug | Freq: Once | INTRAMUSCULAR | Status: AC
  Administered 2019-08-11: 50 ug via INTRAVENOUS

## 2019-08-11 MED ORDER — CLONIDINE ORAL SUSPENSION 10 MCG/ML: 0.1000 mg | Freq: Every day | ORAL | Status: DC

## 2019-08-11 MED ORDER — OXYCODONE HCL 5 MG PO TABS: 5.0000 mg | ORAL_TABLET | ORAL | Status: DC | PRN

## 2019-08-11 MED ORDER — CHLORHEXIDINE GLUCONATE 0.12% ORAL RINSE (MEDLINE KIT)
15.0000 mL | Freq: Two times a day (BID) | OROMUCOSAL | Status: DC
  Administered 2019-08-11 – 2019-08-16 (×10): 15 mL via OROMUCOSAL

## 2019-08-11 MED ORDER — CLONIDINE HCL 0.1 MG PO TABS: 0.1000 mg | ORAL_TABLET | Freq: Every day | ORAL | Status: DC

## 2019-08-11 MED ORDER — RENA-VITE PO TABS: 1.0000 | ORAL_TABLET | Freq: Every day | ORAL | Status: DC

## 2019-08-11 MED ORDER — CARVEDILOL 12.5 MG PO TABS: 25.0000 mg | ORAL_TABLET | Freq: Two times a day (BID) | ORAL | Status: DC

## 2019-08-11 MED ORDER — NOREPINEPHRINE 4 MG/250ML-% IV SOLN
0.0000 ug/min | INTRAVENOUS | Status: DC
  Administered 2019-08-11: 17:00:00 15 ug/min via INTRAVENOUS

## 2019-08-11 MED ORDER — CAMPHOR-MENTHOL 0.5-0.5 % EX LOTN
1.0000 "application " | TOPICAL_LOTION | Freq: Three times a day (TID) | CUTANEOUS | Status: DC | PRN
  Filled 2019-08-11: qty 222

## 2019-08-11 MED ORDER — FENTANYL BOLUS VIA INFUSION
50.0000 ug | INTRAVENOUS | Status: DC | PRN
  Administered 2019-08-12 – 2019-08-16 (×15): 50 ug via INTRAVENOUS
  Filled 2019-08-11: qty 50

## 2019-08-11 MED ORDER — LACTATED RINGERS IV SOLN
INTRAVENOUS | Status: DC
  Administered 2019-08-11 – 2019-08-12 (×2): via INTRAVENOUS

## 2019-08-11 MED ORDER — ETOMIDATE 2 MG/ML IV SOLN
INTRAVENOUS | Status: AC | PRN
  Administered 2019-08-11: 30 mg via INTRAVENOUS

## 2019-08-11 MED ORDER — ASPIRIN EC 81 MG PO TBEC
81.0000 mg | DELAYED_RELEASE_TABLET | Freq: Every day | ORAL | Status: DC
  Administered 2019-08-12 – 2019-08-15 (×4): 81 mg via ORAL
  Filled 2019-08-11 (×4): qty 1

## 2019-08-11 MED ORDER — TRAMADOL HCL 50 MG PO TABS: 50.0000 mg | ORAL_TABLET | Freq: Four times a day (QID) | ORAL | Status: DC | PRN

## 2019-08-11 MED ORDER — LIDOCAINE HCL (PF) 1 % IJ SOLN: 5.0000 mL | INTRAMUSCULAR | Status: DC | PRN

## 2019-08-11 MED ORDER — HYDROMORPHONE HCL 1 MG/ML IJ SOLN
0.5000 mg | Freq: Once | INTRAMUSCULAR | Status: AC
  Administered 2019-08-11: 0.5 mg via INTRAVENOUS
  Filled 2019-08-11: qty 1

## 2019-08-11 MED ORDER — VITAL HIGH PROTEIN PO LIQD
1000.0000 mL | ORAL | Status: DC
  Administered 2019-08-11 – 2019-08-13 (×4): 1000 mL

## 2019-08-11 MED ORDER — HYDROXYZINE HCL 25 MG PO TABS: 25.0000 mg | ORAL_TABLET | Freq: Three times a day (TID) | ORAL | Status: DC | PRN

## 2019-08-11 MED ORDER — ALBUTEROL SULFATE HFA 108 (90 BASE) MCG/ACT IN AERS
2.0000 | INHALATION_SPRAY | Freq: Four times a day (QID) | RESPIRATORY_TRACT | Status: DC
  Filled 2019-08-11: qty 6.7

## 2019-08-11 MED ORDER — ALBUMIN HUMAN 25 % IV SOLN
25.0000 g | Freq: Once | INTRAVENOUS | Status: AC
  Administered 2019-08-11: 17:00:00 25 g via INTRAVENOUS

## 2019-08-11 MED ORDER — AMLODIPINE 1 MG/ML ORAL SUSPENSION: 10.0000 mg | Freq: Every day | ORAL | Status: DC

## 2019-08-11 MED ORDER — PENTAFLUOROPROP-TETRAFLUOROETH EX AERO
1.0000 "application " | INHALATION_SPRAY | CUTANEOUS | Status: DC | PRN
  Filled 2019-08-11: qty 30

## 2019-08-11 MED ORDER — GUAIFENESIN-DM 100-10 MG/5ML PO SYRP
10.0000 mL | ORAL_SOLUTION | ORAL | Status: DC | PRN
  Filled 2019-08-11: qty 10

## 2019-08-11 MED ORDER — SODIUM CHLORIDE 0.9 % IV SOLN
100.0000 mg | INTRAVENOUS | Status: AC
  Administered 2019-08-12 – 2019-08-15 (×4): 100 mg via INTRAVENOUS
  Filled 2019-08-11 (×2): qty 20
  Filled 2019-08-11: qty 100
  Filled 2019-08-11: qty 20

## 2019-08-11 MED ORDER — DEXTROSE 50 % IV SOLN
1.0000 | Freq: Once | INTRAVENOUS | Status: AC
  Administered 2019-08-11: 50 mL via INTRAVENOUS
  Filled 2019-08-11: qty 50

## 2019-08-11 MED ORDER — SUCROFERRIC OXYHYDROXIDE 500 MG PO CHEW
500.0000 mg | CHEWABLE_TABLET | Freq: Three times a day (TID) | ORAL | Status: DC
  Administered 2019-08-12 – 2019-08-16 (×13): 500 mg via ORAL
  Filled 2019-08-11 (×16): qty 1

## 2019-08-11 MED ORDER — HYDROCOD POLST-CPM POLST ER 10-8 MG/5ML PO SUER: 5.0000 mL | Freq: Two times a day (BID) | ORAL | Status: DC | PRN

## 2019-08-11 MED ORDER — SODIUM CHLORIDE 0.9 % IV SOLN: 1.0000 g | Freq: Once | INTRAVENOUS | Status: DC

## 2019-08-11 MED ORDER — FENTANYL CITRATE (PF) 100 MCG/2ML IJ SOLN
50.0000 ug | Freq: Once | INTRAMUSCULAR | Status: DC
  Filled 2019-08-11: qty 2

## 2019-08-11 MED ORDER — CALCIUM CARBONATE ANTACID 1250 MG/5ML PO SUSP: 500.0000 mg | Freq: Four times a day (QID) | ORAL | Status: DC | PRN

## 2019-08-11 MED ORDER — SODIUM CHLORIDE 0.9 % IV SOLN: 100.0000 mL | INTRAVENOUS | Status: DC | PRN

## 2019-08-11 MED ORDER — SORBITOL 70 % SOLN
30.0000 mL | Status: DC | PRN
  Filled 2019-08-11: qty 30

## 2019-08-11 MED ORDER — DOCUSATE SODIUM 50 MG/5ML PO LIQD
100.0000 mg | Freq: Two times a day (BID) | ORAL | Status: DC
  Administered 2019-08-11 – 2019-08-16 (×10): 100 mg
  Filled 2019-08-11 (×11): qty 10

## 2019-08-11 MED ORDER — DEXTROSE 50 % IV SOLN: 1.0000 | Freq: Once | INTRAVENOUS | Status: DC

## 2019-08-11 MED ORDER — CALCIUM GLUCONATE-NACL 1-0.675 GM/50ML-% IV SOLN
1.0000 g | Freq: Once | INTRAVENOUS | Status: AC
  Administered 2019-08-11: 1000 mg via INTRAVENOUS
  Filled 2019-08-11: qty 50

## 2019-08-11 MED ORDER — FUROSEMIDE 10 MG/ML IJ SOLN
40.0000 mg | Freq: Once | INTRAMUSCULAR | Status: AC
  Administered 2019-08-11: 40 mg via INTRAVENOUS
  Filled 2019-08-11: qty 4

## 2019-08-11 MED ORDER — SODIUM CHLORIDE 0.9% FLUSH
3.0000 mL | Freq: Two times a day (BID) | INTRAVENOUS | Status: DC
  Administered 2019-08-11 – 2019-08-16 (×2): 3 mL via INTRAVENOUS

## 2019-08-11 MED ORDER — SODIUM CHLORIDE 0.9 % IV SOLN
200.0000 mg | Freq: Once | INTRAVENOUS | Status: AC
  Administered 2019-08-11: 200 mg via INTRAVENOUS
  Filled 2019-08-11: qty 40

## 2019-08-11 MED ORDER — HEPARIN SODIUM (PORCINE) 1000 UNIT/ML DIALYSIS
1200.0000 [IU] | INTRAMUSCULAR | Status: DC | PRN
  Filled 2019-08-11: qty 2

## 2019-08-11 MED ORDER — DOCUSATE SODIUM 283 MG RE ENEM
1.0000 | ENEMA | RECTAL | Status: DC | PRN
  Filled 2019-08-11: qty 1

## 2019-08-11 MED ORDER — HEPARIN SODIUM (PORCINE) 1000 UNIT/ML DIALYSIS
1000.0000 [IU] | INTRAMUSCULAR | Status: DC | PRN
  Filled 2019-08-11: qty 1

## 2019-08-11 MED ORDER — LIDOCAINE-PRILOCAINE 2.5-2.5 % EX CREA
1.0000 "application " | TOPICAL_CREAM | CUTANEOUS | Status: DC | PRN
  Filled 2019-08-11: qty 5

## 2019-08-11 MED ORDER — HEPARIN SODIUM (PORCINE) 5000 UNIT/ML IJ SOLN
5000.0000 [IU] | Freq: Three times a day (TID) | INTRAMUSCULAR | Status: DC
  Administered 2019-08-11 – 2019-08-12 (×3): 5000 [IU] via SUBCUTANEOUS
  Filled 2019-08-11 (×3): qty 1

## 2019-08-11 MED ORDER — ALTEPLASE 2 MG IJ SOLR
2.0000 mg | Freq: Once | INTRAMUSCULAR | Status: DC | PRN
  Filled 2019-08-11: qty 2

## 2019-08-11 MED ORDER — NEPRO/CARBSTEADY PO LIQD: 237.0000 mL | Freq: Three times a day (TID) | ORAL | Status: DC | PRN

## 2019-08-11 MED ORDER — INSULIN ASPART 100 UNIT/ML ~~LOC~~ SOLN: 5.0000 [IU] | Freq: Once | SUBCUTANEOUS | Status: DC

## 2019-08-11 MED ORDER — DOCUSATE SODIUM 100 MG PO CAPS
100.0000 mg | ORAL_CAPSULE | Freq: Two times a day (BID) | ORAL | Status: DC
  Filled 2019-08-11: qty 1

## 2019-08-11 NOTE — Plan of Care (Signed)
  Problem: Clinical Measurements: Goal: Ability to maintain clinical measurements within normal limits will improve Outcome: Progressing Goal: Diagnostic test results will improve Outcome: Progressing   Problem: Nutrition: Goal: Adequate nutrition will be maintained Outcome: Progressing   Problem: Education: Goal: Knowledge of General Education information will improve Description: Including pain rating scale, medication(s)/side effects and non-pharmacologic comfort measures Outcome: Not Progressing   Problem: Activity: Goal: Risk for activity intolerance will decrease Outcome: Not Progressing

## 2019-08-11 NOTE — ED Provider Notes (Signed)
[facility] EMERGENCY DEPARTMENT Provider Note   CSN: [id number] Arrival date & time: 07/25/2019  0900     History   Chief Complaint Chief Complaint  Patient presents with  . Shortness of Breath    dialysis pt  . Weakness    HPI Jonathan Horton is a 58 y.o. male with history of ESRD on dialysis Monday Wednesday Friday, diabetes mellitus, hypertension presents for evaluation of acute onset, progressively worsening shortness of breath, fatigue, myalgias for 3 days.  Reports that he was told that he was febrile at a doctor's office a few days ago and went to urgent care yesterday for evaluation.  At that time he was afebrile, outpatient Covid test was obtained and he was discharged home.  He reports that since then he has had progressively worsening shortness of breath, dyspnea on exertion.  He has a nonproductive cough.  Denies abdominal pain, nausea, vomiting, diarrhea, chest pain.  He was not dialyzed yesterday due to fever.  He has been on dialysis for 3 years, reports he still produces urine.     The history is provided by the patient.    Past Medical History:  Diagnosis Date  . Anemia   . Chronic kidney disease   . Diabetes mellitus without complication ([place])   . History of blood transfusion 05/2016  . Hypertension     Patient Active Problem List   Diagnosis Date Noted  . Uremia 06/12/2017  . Hyperkalemia 06/12/2017  . Abdominal pain 06/12/2017  . Special screening for malignant neoplasms, colon 12/23/2016  . Anemia 06/10/2016  . ESRD on dialysis ([place]) 06/10/2016  . Anemia of chronic kidney failure 06/10/2016  . S/P dialysis catheter insertion ([place])   . Renal failure 06/07/2016  . Acute renal failure (ARF) ([place]) 06/07/2016  . Essential hypertension 06/07/2016  . Diabetes mellitus with end stage renal disease ([place]) 06/07/2016    Past Surgical History:  Procedure Laterality Date  . AV FISTULA PLACEMENT Left 06/08/2016   Procedure: ARTERIOVENOUS (AV)  FISTULA CREATION;  Surgeon: Angelia Mould, MD;  Location: [facility];  Service: Vascular;  Laterality: Left;  . INSERTION OF DIALYSIS CATHETER Right 06/08/2016   Procedure: INSERTION OF DIALYSIS CATHETER;  Surgeon: Angelia Mould, MD;  Location: [facility];  Service: Vascular;  Laterality: Right;        Home Medications    Prior to Admission medications   Medication Sig Start Date End Date Taking? Authorizing Provider  amLODipine (NORVASC) 10 MG tablet Take 10 mg by mouth daily. 05/01/16   [provider]  aspirin EC 81 MG tablet Take 81 mg by mouth daily.    [provider]  carvedilol (COREG) 25 MG tablet Take 25 mg by mouth 2 (two) times daily. 05/01/16   [provider]  cloNIDine (CATAPRES) 0.1 MG tablet Take 0.1 mg by mouth daily.  01/03/14   [provider]  multivitamin (RENA-VIT) TABS tablet Take 1 tablet by mouth at bedtime. 06/10/16   Dhungel, Flonnie Overman, MD  traMADol (ULTRAM) 50 MG tablet Take 50 mg by mouth every 6 (six) hours as needed for moderate pain.  01/08/14   [provider]  VELPHORO 500 MG chewable tablet Chew 500 mg by mouth 3 (three) times daily with meals.  11/25/16   [provider]    Family History Family History  Problem Relation Age of Onset  . Healthy Mother   . Healthy Father   . Colon cancer Neg Hx   . Esophageal  cancer Neg Hx   . Rectal cancer Neg Hx   . Stomach cancer Neg Hx     Social History Social History   Tobacco Use  . Smoking status: Never Smoker  . Smokeless tobacco: Never Used  Substance Use Topics  . Alcohol use: No  . Drug use: No     Allergies   Patient has no known allergies.   Review of Systems Review of Systems  Constitutional: Positive for chills, fatigue and fever.  Respiratory: Positive for cough and shortness of breath.   Cardiovascular: Negative for chest pain.  Gastrointestinal: Negative for abdominal pain, nausea and vomiting.  Musculoskeletal: Positive for  myalgias.  All other systems reviewed and are negative.    Physical Exam Updated Vital Signs BP 113/71   Pulse 83   Temp 98.7 F (37.1 C) (Oral)   Resp (!) 27   SpO2 93%   Physical Exam Constitutional:      Appearance: He is well-developed. He is ill-appearing.  Neck:     Musculoskeletal: Neck supple.  Cardiovascular:     Rate and Rhythm: Normal rate and regular rhythm.     Pulses: Normal pulses.     Comments: AV fistula in the left upper extremity with palpable thrill.  2+ radial and DP/PT pulses bilateral Pulmonary:     Effort: Tachypnea and respiratory distress present.     Breath sounds: Examination of the right-middle field reveals rales. Examination of the left-middle field reveals rales. Examination of the right-lower field reveals rales. Examination of the left-lower field reveals rales. Rales present.     Comments: SPO2 saturations 88% on room air, patient tachypneic, speaking in short phrases.  Occasional grunting.  Rales noted bilaterally Abdominal:     Palpations: Abdomen is soft.     Tenderness: There is no abdominal tenderness. There is no guarding or rebound.  Musculoskeletal:     Right lower leg: He exhibits no tenderness.     Left lower leg: He exhibits no tenderness.  Skin:    General: Skin is warm and dry.  Neurological:     Mental Status: He is alert.      ED Treatments / Results  Labs (all labs ordered are listed, but only abnormal results are displayed) Labs Reviewed  CBC WITH DIFFERENTIAL/PLATELET - Abnormal; Notable for the following components:      Result Value   RBC 3.31 (*)    Hemoglobin 10.9 (*)    HCT 32.8 (*)    All other components within normal limits  COMPREHENSIVE METABOLIC PANEL - Abnormal; Notable for the following components:   Sodium 130 (*)    Potassium 7.1 (*)    Chloride 89 (*)    Glucose, Bld 52 (*)    BUN 95 (*)    Creatinine, Ser 18.72 (*)    Albumin 3.2 (*)    GFR calc non Af Amer 2 (*)    GFR calc Af Amer 3 (*)     Anion gap 16 (*)    All other components within normal limits  D-DIMER, QUANTITATIVE (NOT AT [facility]) - Abnormal; Notable for the following components:   D-Dimer, Quant 0.58 (*)    All other components within normal limits  LACTATE DEHYDROGENASE - Abnormal; Notable for the following components:   LDH 207 (*)    All other components within normal limits  FERRITIN - Abnormal; Notable for the following components:   Ferritin 1,544 (*)    All other components within normal limits  FIBRINOGEN -  Abnormal; Notable for the following components:   Fibrinogen 620 (*)    All other components within normal limits  C-REACTIVE PROTEIN - Abnormal; Notable for the following components:   CRP 10.3 (*)    All other components within normal limits  POC SARS CORONAVIRUS 2 AG -  ED - Abnormal; Notable for the following components:   SARS Coronavirus 2 Ag POSITIVE (*)    All other components within normal limits  TROPONIN I (HIGH SENSITIVITY) - Abnormal; Notable for the following components:   Troponin I (High Sensitivity) 101 (*)    All other components within normal limits  CULTURE, BLOOD (ROUTINE X 2)  CULTURE, BLOOD (ROUTINE X 2)  LACTIC ACID, PLASMA  PROCALCITONIN  TRIGLYCERIDES  LACTIC ACID, PLASMA  TROPONIN I (HIGH SENSITIVITY)    EKG EKG Interpretation  Date/Time:  Saturday August 11 2019 09:11:25 EST Ventricular Rate:  86 PR Interval:    QRS Duration: 91 QT Interval:  367 QTC Calculation: 439 R Axis:   22 Text Interpretation: Sinus rhythm Probable left atrial enlargement Anteroseptal infarct, old Abnormal T, consider ischemia, lateral leads Since nost recent  ECG, patient has new TW inversions in lateral leads, however these have been present on prior ECGs Confirmed by Gareth Morgan ([id number]) on 08/20/2019 11:13:56 AM   Radiology Dg Chest Port 1 View  Result Date: 07/29/2019 CLINICAL DATA:  58 year old male with shortness of breath and weakness. Sent home from dialysis center  with fever. EXAM: PORTABLE CHEST 1 VIEW COMPARISON:  Prior chest x-ray 09/19/2016 FINDINGS: Cardiomegaly. Mediastinal contours are within normal limits. Increased pulmonary vascular congestion now with diffuse bilateral mild interstitial airspace opacities most consistent with mild pulmonary edema. Trace right pleural effusion. No pneumothorax. Overlapping stents visualized in the axillary vein on the left. No acute osseous abnormality. IMPRESSION: 1. Cardiomegaly with imaging findings most consistent with mild pulmonary edema. Suspect volume overload in this patient with a history of end-stage renal disease on hemodialysis. 2. In the appropriate clinical setting, the airspace opacities could also reflect viral pneumonia. Electronically Signed   By: Jacqulynn Cadet M.D.   On: 08/15/2019 10:42    Procedures .Critical Care Performed by: Renita Papa, PA-C Authorized by: Renita Papa, PA-C   Critical care provider statement:    Critical care time (minutes):  45   Critical care was necessary to treat or prevent imminent or life-threatening deterioration of the following conditions:  Respiratory failure, renal failure and metabolic crisis   Critical care was time spent personally by me on the following activities:  Discussions with consultants, evaluation of patient's response to treatment, examination of patient, ordering and performing treatments and interventions, ordering and review of laboratory studies, ordering and review of radiographic studies, pulse oximetry, re-evaluation of patient's condition, obtaining history from patient or surrogate and review of old charts   (including critical care time)  Medications Ordered in ED Medications  sodium zirconium cyclosilicate (LOKELMA) packet 10 g (has no administration in time range)  Chlorhexidine Gluconate Cloth 2 % PADS 6 each (has no administration in time range)  pentafluoroprop-tetrafluoroeth (GEBAUERS) aerosol 1 application (has no  administration in time range)  lidocaine (PF) (XYLOCAINE) 1 % injection 5 mL (has no administration in time range)  lidocaine-prilocaine (EMLA) cream 1 application (has no administration in time range)  heparin injection 1,000 Units (has no administration in time range)  alteplase (CATHFLO ACTIVASE) injection 2 mg (has no administration in time range)  heparin injection 1,200 Units (has no administration in time  range)  doxercalciferol (HECTOROL) injection 7 mcg (has no administration in time range)  dextrose 50 % solution 50 mL (has no administration in time range)  insulin aspart (novoLOG) injection 5 Units (has no administration in time range)  calcium gluconate 1 g/ 50 mL sodium chloride IVPB (has no administration in time range)  heparin 1000 UNIT/ML injection (has no administration in time range)  doxercalciferol (HECTOROL) 4 MCG/2ML injection (has no administration in time range)  HYDROmorphone (DILAUDID) injection 0.5 mg (0.5 mg Intravenous Given 08/12/2019 1048)  dextrose 50 % solution 50 mL (50 mLs Intravenous Given 08/08/2019 1143)     Initial Impression / Assessment and Plan / ED Course  I have reviewed the triage vital signs and the nursing notes.  Pertinent labs & imaging results that were available during my care of the patient were reviewed by me and considered in my medical decision making (see chart for details).        Jonathan Horton was evaluated in Emergency Department on 08/05/2019 for the symptoms described in the history of present illness. He was evaluated in the context of the global COVID-19 pandemic, which necessitated consideration that the patient might be at risk for infection with the SARS-CoV-2 virus that causes COVID-19. Institutional protocols and algorithms that pertain to the evaluation of patients at risk for COVID-19 are in a state of rapid change based on information released by regulatory bodies including the CDC and federal and state organizations. These  policies and algorithms were followed during the patient's care in the ED.  Patient presenting for evaluation of progressively worsening shortness of breath, fatigue, cough.  He is afebrile in the ED, hypoxic to 88% on room air with improvement on 2 L nasal cannula.  He appears fatigued, occasionally grunting on examination but did improve with application of supplemental oxygen.  Chest x-ray shows cardiomegaly with what appears to the pulmonary edema versus multifocal pneumonia.  His rapid Covid test is positive.  The remainder of lab work reviewed by me shows no leukocytosis, stable anemia, elevated BUN and creatinine consistent with ESRD.  His potassium is elevated at 7.1.  His EKG shows some T wave inversions in the lateral leads however these have been present on previous EKGs.  His troponin is mildly elevated, could be secondary to demand ischemia or troponin leak in the setting of ESRD.  He denies any chest pain and I have a low suspicion of ACS/MI at this time.  He is hyperglycemic with a sugar of 52 so he was given an amp of D50.  We will give a second amp of D50 and 5 units insulin, calcium, Lokelma for ESRD  CONSULT: Spoke with Dr. Hollie Salk with nephrology who will arrange for emergent hemodialysis.  Spoke with Dr. Lorin Mercy with [facility] hospitalist service who agrees to assume care of patient and bring him into the hospital for further evaluation and management.  1:00PM Patient reportedly became agitated per RN. On my assessment, he exhibited increased work of breathing, accessory muscle use, decreased mentation. SpO2 saturations down to 66% on 4L via [place]. Due to respiratory failure likely in the setting of COVID-19 infection and complicated by pulmonary edema, the patient was intubated by Dr. Sherry Ruffing. He was also given IV lsaix and solumedrol. Plan to admit to ICU.   Final Clinical Impressions(s) / ED Diagnoses   Final diagnoses:  COVID-19 virus infection  Acute respiratory failure with hypoxia  (HCC)  Hyperkalemia  Elevated troponin    ED Discharge Orders  None       Debroah Baller 07/28/2019 1418    Gareth Morgan, MD 08/12/19 [phone]

## 2019-08-11 NOTE — Progress Notes (Signed)
RT note: RT and RN transported patient on vent from ED to 2M06. Vital signs stable through out.

## 2019-08-11 NOTE — Consult Note (Signed)
ER Consult Note   Jonathan Horton TIW:[id number] DOB: 12/18/1960 DOA: 08/20/2019  PCP: [name], MD Consultants:  Havery Moros - GI Patient coming from:  Home - lives with wife and son; NOK: Wife, Jonathan Horton, [place]  Chief Complaint: SOB  HPI: Jonathan Horton is a 58 y.o. male with medical history significant of HTN; DM; and ESRD on MWF HD presenting with SOB.  He was seen for fever at [facility] yesterday and had COVID testing done at that time.  He was conversant without difficulty at the time of my evaluation and then had a rapid decompensation shortly.  He reported that symptoms started about 2 days ago with intermittent, nonproductive cough.  He went to HD Wednesday without difficulty, but when he went on Friday he was turned away because of fever.  He went to Urgent Care and reports he did not have a fever then.  Last night, he developed acutely worsening SOB and so came to the ER.  He denies COVID contacts and is uncertain where he became infected.   ED Course:  Unwell x 3 days, seen at [facility] yesterday for fever.  Worsened last night with SOB.  88% on RA on arrival, doing well on 2-3L [place] O2.  CXR suggests possible volume overload, missed HD yesterday, K+ 7.1 (insulin, dextrose, Lokelma).  Nephrology will arrange for HD.  Review of Systems: As per HPI; otherwise review of systems reviewed and negative.   Ambulatory Status:  Ambulates without assistance  Past Medical History:  Diagnosis Date  . Anemia   . Chronic kidney disease   . Diabetes mellitus without complication ([place])   . History of blood transfusion 05/2016  . Hypertension     Past Surgical History:  Procedure Laterality Date  . AV FISTULA PLACEMENT Left 06/08/2016   Procedure: ARTERIOVENOUS (AV) FISTULA CREATION;  Surgeon: Angelia Mould, MD;  Location: [facility];  Service: Vascular;  Laterality: Left;  . INSERTION OF DIALYSIS CATHETER Right 06/08/2016   Procedure: INSERTION OF DIALYSIS CATHETER;  Surgeon: Angelia Mould, MD;  Location: [facility] OR;  Service: Vascular;  Laterality: Right;    Social History   Socioeconomic History  . Marital status: Married    Spouse name: Not on file  . Number of children: Not on file  . Years of education: Not on file  . Highest education level: Not on file  Occupational History  . Not on file  Social Needs  . Financial resource strain: Not on file  . Food insecurity    Worry: Not on file    Inability: Not on file  . Transportation needs    Medical: Not on file    Non-medical: Not on file  Tobacco Use  . Smoking status: Never Smoker  . Smokeless tobacco: Never Used  Substance and Sexual Activity  . Alcohol use: No  . Drug use: No  . Sexual activity: Not on file  Lifestyle  . Physical activity    Days per week: Not on file    Minutes per session: Not on file  . Stress: Not on file  Relationships  . Social Herbalist on phone: Not on file    Gets together: Not on file    Attends religious service: Not on file    Active member of club or organization: Not on file    Attends meetings of clubs or organizations: Not on file    Relationship status: Not on file  . Intimate partner violence  Fear of current or ex partner: Not on file    Emotionally abused: Not on file    Physically abused: Not on file    Forced sexual activity: Not on file  Other Topics Concern  . Not on file  Social History Narrative  . Not on file    No Known Allergies  Family History  Problem Relation Age of Onset  . Healthy Mother   . Healthy Father   . Colon cancer Neg Hx   . Esophageal cancer Neg Hx   . Rectal cancer Neg Hx   . Stomach cancer Neg Hx     Prior to Admission medications   Medication Sig Start Date End Date Taking? Authorizing Provider  amLODipine (NORVASC) 10 MG tablet Take 10 mg by mouth daily. 05/01/16   [provider]  aspirin EC 81 MG tablet Take 81 mg by mouth daily.    [provider]  carvedilol (COREG) 25 MG tablet  Take 25 mg by mouth 2 (two) times daily. 05/01/16   [provider]  cloNIDine (CATAPRES) 0.1 MG tablet Take 0.1 mg by mouth daily.  01/03/14   [provider]  multivitamin (RENA-VIT) TABS tablet Take 1 tablet by mouth at bedtime. 06/10/16   Dhungel, Flonnie Overman, MD  traMADol (ULTRAM) 50 MG tablet Take 50 mg by mouth every 6 (six) hours as needed for moderate pain.  01/08/14   [provider]  VELPHORO 500 MG chewable tablet Chew 500 mg by mouth 3 (three) times daily with meals.  11/25/16   [provider]    Physical Exam: Vitals:   07/29/2019 1250 08/17/2019 1300 08/03/2019 1311 07/23/2019 1315  BP:  (!) 185/101  (!) 165/96  Pulse:  88  96  Resp:  (!) 31  18  Temp:      TempSrc:      SpO2:  (!) 68%  (!) 89%  Weight: 81.3 kg     Height:   5\' 11"  (1.803 m)      . General:  Appears calm and comfortable and is NAD . Eyes:  PERRL, EOMI, normal lids, iris . ENT:  grossly normal hearing, lips & tongue, mmm . Neck:  no LAD, masses or thyromegaly . Cardiovascular:  RRR, no m/r/g. No LE edema.  Marland Kitchen Respiratory:   Diffuse rhonchi.  Mildly increased respiratory effort with good air movement prior to arrest.  After 15 minutes later, he developed respiratory distress with tachypnea into the upper 30s and clear distress leading to rapid decompensation with sats into the 60s requiring emergent intubation. . Abdomen:  soft, NT, ND, NABS . Skin:  no rash or induration seen on limited exam . Musculoskeletal:  grossly normal tone BUE/BLE, good ROM, no bony abnormality . Psychiatric:  blunted mood and affect, speech fluent and appropriate, AOx3 . Neurologic:  CN 2-12 grossly intact, moves all extremities in coordinated fashion, sensation intact    Radiological Exams on Admission: Dg Chest Portable 1 View  Result Date: 07/28/2019 CLINICAL DATA:  Shortness of breath. COVID positive. EXAM: PORTABLE CHEST 1 VIEW COMPARISON:  08/13/2019 FINDINGS: 1311 hours. Endotracheal tube tip is 5  cm above the base of the carina. Patchy airspace disease noted bilaterally, and progressive at the right base. Cardiopericardial silhouette is at upper limits of normal for size. The visualized bony structures of the thorax are intact. Telemetry leads overlie the chest. IMPRESSION: 1. Endotracheal tube tip is 5 cm above the base of the carina. 2. Patchy bilateral airspace disease, progressive  at the right base. Electronically Signed   By: Misty Stanley M.D.   On: 07/31/2019 13:31   Dg Chest Port 1 View  Result Date: 07/23/2019 CLINICAL DATA:  58 year old male with shortness of breath and weakness. Sent home from dialysis center with fever. EXAM: PORTABLE CHEST 1 VIEW COMPARISON:  Prior chest x-ray 09/19/2016 FINDINGS: Cardiomegaly. Mediastinal contours are within normal limits. Increased pulmonary vascular congestion now with diffuse bilateral mild interstitial airspace opacities most consistent with mild pulmonary edema. Trace right pleural effusion. No pneumothorax. Overlapping stents visualized in the axillary vein on the left. No acute osseous abnormality. IMPRESSION: 1. Cardiomegaly with imaging findings most consistent with mild pulmonary edema. Suspect volume overload in this patient with a history of end-stage renal disease on hemodialysis. 2. In the appropriate clinical setting, the airspace opacities could also reflect viral pneumonia. Electronically Signed   By: Jacqulynn Cadet M.D.   On: 07/24/2019 10:42    EKG: Independently reviewed.  NSR with rate 86; nonspecific ST changes with no evidence of acute ischemia   Labs on Admission: I have personally reviewed the available labs and imaging studies at the time of the admission.  Pertinent labs:   Na++ 130 K+ 7.1 Glucose 52 BUN 95/Creatinine 18.72/GFR 3 Anion gap 16 LDH 207 HS troponin 101 Lactate 1.5 Procalcitonin 0.55 WBC 7.1 Hgb 10.9 D-dimer 0.58 Fibrinogen 620 COVID POSITIVE   Assessment/Plan Principal Problem:   Acute  on chronic respiratory failure with hypoxia (HCC) Active Problems:   Essential hypertension   Diabetes mellitus with end stage renal disease (HCC)   ESRD on dialysis (HCC)   Hyperkalemia   Acute respiratory disease due to COVID-19 virus    -Patient presenting with SOB -Initial evaluation concerning for volume overload associated with ESRD in the setting of COVID-19 infection -He was found to be hyperkalemic, which was treated with insulin/dextrose/Lokelma -Shortly after my evaluation, the patient had rapid decompensation with decline in O2 sats to the 60s (fell to 9% during paralysis peri-intubation) -He was quickly and successfully intubated with rapid normalization of O2 sats -PCCM was notified of the change and will admit the patient -I ordered steroids and remdesivir prior to his decompensation as well as the COVID order set -I notified nephrology of the change in status; he was previously to go to HD urgently but will now need urgent CRRT for volume overload -His wife was allowed in and was intercepted just prior to entering his room during intubation; she has been notified of his COVID-19 diagnosis as well as his need for emergent intubation and that he has stabilized post-intubation -Patient's family have been encouraged to quarantine for 14 days. -Care of the patient has been transferred to PCCM at this time.   Total critical care time: 65 minutes Critical care time was exclusive of separately billable procedures and treating other patients. Critical care was necessary to treat or prevent imminent or life-threatening deterioration. Critical care was time spent personally by me on the following activities: development of treatment plan with patient and/or surrogate as well as nursing, discussions with consultants, evaluation of patient's response to treatment, examination of patient, obtaining history from patient or surrogate, ordering and performing treatments and interventions,  ordering and review of laboratory studies, ordering and review of radiographic studies, pulse oximetry and re-evaluation of patient's condition.    Karmen Bongo MD [facility]   How to contact the [facility] Attending or Consulting provider [place] or covering provider during after hours [place], for this  patient?  1. Check the care team in [facility] and look for a) attending/consulting [facility] provider listed and b) the [facility] team listed 2. Log into [email or web address] and use [place]'s universal password to access. If you do not have the password, please contact the hospital operator. 3. Locate the [facility] provider you are looking for under [facility] and page to a number that you can be directly reached. 4. If you still have difficulty reaching the provider, please page the [facility] (Director on Call) for the Hospitalists listed on amion for assistance.   08/06/2019, 1:43 PM

## 2019-08-11 NOTE — ED Notes (Signed)
ED Provider at bedside. 

## 2019-08-11 NOTE — Consult Note (Signed)
NAMEGeovonni Horton, MRN:  [id number], DOB:  1960-12-03, LOS: 0 ADMISSION DATE:  08/04/2019, CONSULTATION DATE:  08/15/2019 REFERRING MD:  Dr Lorin Mercy, CHIEF COMPLAINT:  Covid pneumonia   Brief History   58 year old gentleman with a history of hypertension, end-stage renal disease on hemodialysis Presented with shortness of breath Fever of a days duration, seen at urgent care yesterday and had Covid testing performed, presented to the ED with worsening shortness of breath on the days duration Rapidly decompensated in the emergency department-subsequently intubated  History of present illness   Has been feeling unwell for about 3 days Room air saturations 88%, was doing well on 2 to 3 L of oxygen Admits hemodialysis yesterday secondary to feeling unwell  Past Medical History   Past Medical History:  Diagnosis Date  . Anemia   . Chronic kidney disease   . Diabetes mellitus without complication ([place])   . History of blood transfusion 05/2016  . Hypertension      Significant Hospital Events   Intubated 08/01/2019  Consults:  PCCM  Procedures:  None  Significant Diagnostic Tests:  Chest x-ray reviewed by myself showing multifocal infiltrates  Micro Data:  Blood cultures 11/21 2020 Coronavirus positive Antimicrobials:  Remdesivir 11/21>>  Interim history/subjective:  Intubated, sedated  Objective   Blood pressure (!) 165/96, pulse 96, temperature 98.7 F (37.1 C), temperature source Oral, resp. rate 18, height 5\' 11"  (1.803 m), weight 81.3 kg, SpO2 (!) 89 %.    Vent Mode: PRVC FiO2 (%):  [100 %] 100 % Set Rate:  [20 bmp] 20 bmp Vt Set:  [370 mL] 370 mL PEEP:  [10 cmH20] 10 cmH20 Plateau Pressure:  [29 cmH20] 29 cmH20  No intake or output data in the 24 hours ending 07/23/2019 1356 Filed Weights   07/24/2019 1250  Weight: 81.3 kg    Examination: General: Middle-age gentleman, does not appear to be in distress HENT: Dry oral mucosa Lungs: Rales at the bases  bilaterally, clear anteriorly Cardiovascular: S1-S2 appreciated Abdomen: Soft, bowel sounds appreciated Extremities: No clubbing, no edema Neuro: Sedated GU: Dialysis patient  Resolved Hospital Problem list     Assessment & Plan:   COVID-19 positive test (U07.1, COVID-19) with Acute Pneumonia (J12.89, Other viral pneumonia) (If respiratory failure or sepsis present, add as separate assessment) -Start remdesivir -Start steroids  Acute hypoxemic respiratory failure -Full vent support, PRVC with 5 cc/kg -Maintain mean airway pressure less than 30 -Currently requiring PEEP of 8 -VAP bundle in place  End-stage renal disease -On dialysis -Renal service will assist with dialysis  Hyperkalemia -Received Lokelma/insulin/dextrose -Trend electrolytes -Avoid nephrotoxic's  Hypertension -Hold antihypertensives at present -Reinitiate as indicated  Diabetes -Sliding scale insulin once diet started  Trend labs  Best practice:  Diet: Tube feeds Pain/Anxiety/Delirium protocol (if indicated): Propofol/fentanyl VAP protocol (if indicated): In place DVT prophylaxis: Heparin GI prophylaxis: Protonix Glucose control: SSI Mobility: Bedrest Code Status: Full code Family Communication: Will update Disposition: ICU  Labs   CBC: Recent Labs  Lab 08/14/2019 0959  WBC 7.1  NEUTROABS 5.9  HGB 10.9*  HCT 32.8*  MCV 99.1  PLT 465    Basic Metabolic Panel: Recent Labs  Lab 08/12/2019 0959  NA 130*  K 7.1*  CL 89*  CO2 25  GLUCOSE 52*  BUN 95*  CREATININE 18.72*  CALCIUM 9.2   GFR: Estimated Creatinine Clearance: 4.6 mL/min (A) (by C-G formula based on SCr of 18.72 mg/dL (H)). Recent Labs  Lab 07/23/2019 0959 07/31/2019 1000 07/22/2019 1157  PROCALCITON 0.55  --   --   WBC 7.1  --   --   LATICACIDVEN  --  1.5 2.0*    Liver Function Tests: Recent Labs  Lab 08/06/2019 0959  AST 19  ALT 12  ALKPHOS 56  BILITOT 0.9  PROT 7.4  ALBUMIN 3.2*   No results for input(s):  LIPASE, AMYLASE in the last 168 hours. No results for input(s): AMMONIA in the last 168 hours.  ABG    Component Value Date/Time   TCO2 20 (L) 06/12/2017 2106     Coagulation Profile: No results for input(s): INR, PROTIME in the last 168 hours.  Cardiac Enzymes: No results for input(s): CKTOTAL, CKMB, CKMBINDEX, TROPONINI in the last 168 hours.  HbA1C: Hgb A1c MFr Bld  Date/Time Value Ref Range Status  06/10/2016 04:14 AM 5.6 4.8 - 5.6 % Final    Comment:    (NOTE)         Pre-diabetes: 5.7 - 6.4         Diabetes: >6.4         Glycemic control for adults with diabetes: <7.0     CBG: Recent Labs  Lab 08/17/2019 1324  GLUCAP 99    Review of Systems:   Unobtainable  Past Medical History  He,  has a past medical history of Anemia, Chronic kidney disease, Diabetes mellitus without complication ([place]), History of blood transfusion (05/2016), and Hypertension.   Surgical History    Past Surgical History:  Procedure Laterality Date  . AV FISTULA PLACEMENT Left 06/08/2016   Procedure: ARTERIOVENOUS (AV) FISTULA CREATION;  Surgeon: Angelia Mould, MD;  Location: [place] [facility];  Service: Vascular;  Laterality: Left;  . INSERTION OF DIALYSIS CATHETER Right 06/08/2016   Procedure: INSERTION OF DIALYSIS CATHETER;  Surgeon: Angelia Mould, MD;  Location: [place];  Service: Vascular;  Laterality: Right;     Social History   reports that he has never smoked. He has never used smokeless tobacco. He reports that he does not drink alcohol or use drugs.   Family History   His family history includes Healthy in his father and mother. There is no history of Colon cancer, Esophageal cancer, Rectal cancer, or Stomach cancer.   Allergies No Known Allergies   The patient is critically ill with multiple organ systems failure and requires high complexity decision making for assessment and support, frequent evaluation and titration of therapies, application of advanced monitoring  technologies and extensive interpretation of multiple databases. Critical Care Time devoted to patient care services described in this note independent of APP/resident time (if applicable)  is 40 minutes.   Sherrilyn Rist MD [facility] Personal pager: [phone] If unanswered, please page CCM On-call: [phone]

## 2019-08-11 NOTE — ED Notes (Signed)
ED TO INPATIENT HANDOFF REPORT  ED Nurse Name and Phone #:  Percell Locus, RN  S Name/Age/Gender Jonathan Horton 58 y.o. male Room/Bed: 022C/022C  Code Status   Code Status: Full Code  Home/SNF/Other Home Patient oriented to: self Is this baseline? No   Triage Complete: Triage complete  Chief Complaint sob and dialysis patient  Triage Note Pt arrives POV from home c/o shortness of breath and weakness. Pt is a Mon, Wed, Fri dialysis pt.  Pt states Wednesday was his last dialysis tx; pt reports dialysis center sent pt home due to fever. Pt denies exposure to COVID.   Allergies No Known Allergies  Level of Care/Admitting Diagnosis ED Disposition    ED Disposition Condition Comment   Admit  Hospital Area: [facility] [[id number]]  Level of Care: ICU [6]  Covid Evaluation: Confirmed COVID Positive  Diagnosis: Pneumonia due to COVID-19 virus [[id number]]  Admitting Physician: Laurin Coder [[id number]]  Attending Physician: Laurin Coder [[id number]]  Estimated length of stay: 3 - 4 days  Certification:: I certify this patient will need inpatient services for at least 2 midnights  Bed request comments: isolation  PT Class (Do Not Modify): Inpatient [101]  PT Acc Code (Do Not Modify): Private [1]       B Medical/Surgery History Past Medical History:  Diagnosis Date  . Anemia   . Chronic kidney disease   . Diabetes mellitus without complication ([place])   . History of blood transfusion 05/2016  . Hypertension    Past Surgical History:  Procedure Laterality Date  . AV FISTULA PLACEMENT Left 06/08/2016   Procedure: ARTERIOVENOUS (AV) FISTULA CREATION;  Surgeon: Angelia Mould, MD;  Location: [place];  Service: Vascular;  Laterality: Left;  . INSERTION OF DIALYSIS CATHETER Right 06/08/2016   Procedure: INSERTION OF DIALYSIS CATHETER;  Surgeon: Angelia Mould, MD;  Location: [place];  Service: Vascular;  Laterality: Right;     A IV  Location/Drains/Wounds Patient Lines/Drains/Airways Status   Active Line/Drains/Airways    Name:   Placement date:   Placement time:   Site:   Days:   Peripheral IV 07/25/2019 Right Antecubital   07/25/2019    1020    Antecubital   less than 1   Peripheral IV 07/25/2019 Right Forearm   08/15/2019    1356    Forearm   less than 1   Fistula / Graft Left Upper arm Arteriovenous fistula   06/08/16    1151    Upper arm   1159   Hemodialysis Catheter Right Subclavian Permanent   06/08/16    1149    Subclavian   1159   Airway 7.5 mm   07/26/2019    1321     less than 1   Incision (Closed) 06/08/16 Arm Left   06/08/16    1155     1159   Incision (Closed) 06/08/16 Chest Right   06/08/16    1155     1159          Intake/Output Last 24 hours No intake or output data in the 24 hours ending 08/20/2019 1511  Labs/Imaging Results for orders placed or performed during the hospital encounter of 08/03/2019 (from the past 48 hour(s))  Troponin I (High Sensitivity)     Status: Abnormal   Collection Time: 07/28/2019  9:59 AM  Result Value Ref Range   Troponin I (High Sensitivity) 101 (HH) <18 ng/L    Comment: CRITICAL RESULT CALLED TO, READ BACK BY  AND VERIFIED WITH: RN N Gunda Maqueda AT 1133 08/10/2019 BY L BENFIELD (NOTE) Elevated high sensitivity troponin I (hsTnI) values and significant  changes across serial measurements may suggest ACS but many other  chronic and acute conditions are known to elevate hsTnI results.  Refer to the Links section for chest pain algorithms and additional  guidance. Performed at [facility], [place] [address], [place], [place] [address]   CBC WITH DIFFERENTIAL     Status: Abnormal   Collection Time: 07/22/2019  9:59 AM  Result Value Ref Range   WBC 7.1 4.0 - 10.5 K/uL   RBC 3.31 (L) 4.22 - 5.81 MIL/uL   Hemoglobin 10.9 (L) 13.0 - 17.0 g/dL   HCT 32.8 (L) 39.0 - 52.0 %   MCV 99.1 80.0 - 100.0 fL   MCH 32.9 26.0 - 34.0 pg   MCHC 33.2 30.0 - 36.0 g/dL   RDW 12.4 11.5 - 15.5 %    Platelets 298 150 - 400 K/uL   nRBC 0.0 0.0 - 0.2 %   Neutrophils Relative % 84 %   Neutro Abs 5.9 1.7 - 7.7 K/uL   Lymphocytes Relative 10 %   Lymphs Abs 0.7 0.7 - 4.0 K/uL   Monocytes Relative 6 %   Monocytes Absolute 0.4 0.1 - 1.0 K/uL   Eosinophils Relative 0 %   Eosinophils Absolute 0.0 0.0 - 0.5 K/uL   Basophils Relative 0 %   Basophils Absolute 0.0 0.0 - 0.1 K/uL   Immature Granulocytes 0 %   Abs Immature Granulocytes 0.03 0.00 - 0.07 K/uL    Comment: Performed at [facility], [address], [place], [place] [address]  Comprehensive metabolic panel     Status: Abnormal   Collection Time: 08/10/2019  9:59 AM  Result Value Ref Range   Sodium 130 (L) 135 - 145 mmol/L   Potassium 7.1 (HH) 3.5 - 5.1 mmol/L    Comment: CRITICAL RESULT CALLED TO, READ BACK BY AND VERIFIED WITH: RN N Tami Blass AT 1133 08/19/2019 BY L BENFIELD NO VISIBLE HEMOLYSIS    Chloride 89 (L) 98 - 111 mmol/L   CO2 25 22 - 32 mmol/L   Glucose, Bld 52 (L) 70 - 99 mg/dL   BUN 95 (H) 6 - 20 mg/dL   Creatinine, Ser 18.72 (H) 0.61 - 1.24 mg/dL   Calcium 9.2 8.9 - 10.3 mg/dL   Total Protein 7.4 6.5 - 8.1 g/dL   Albumin 3.2 (L) 3.5 - 5.0 g/dL   AST 19 15 - 41 U/L   ALT 12 0 - 44 U/L   Alkaline Phosphatase 56 38 - 126 U/L   Total Bilirubin 0.9 0.3 - 1.2 mg/dL   GFR calc non Af Amer 2 (L) >60 mL/min   GFR calc Af Amer 3 (L) >60 mL/min   Anion gap 16 (H) 5 - 15    Comment: Performed at [facility], [place] [address] [place], [place] [address]  D-dimer, quantitative     Status: Abnormal   Collection Time: 07/31/2019  9:59 AM  Result Value Ref Range   D-Dimer, Quant 0.58 (H) 0.00 - 0.50 ug/mL-FEU    Comment: (NOTE) At the manufacturer cut-off of 0.50 ug/mL FEU, this assay has been documented to exclude PE with a sensitivity and negative predictive value of 97 to 99%.  At this time, this assay has not been approved by the FDA to exclude DVT/VTE. Results should be correlated with clinical  presentation. Performed at [facility], [place] [address], [place], [place] [address]   Procalcitonin     Status: None   Collection Time: 08/06/2019  9:59 AM  Result Value Ref Range   Procalcitonin 0.55 ng/mL    Comment:        Interpretation: PCT > 0.5 ng/mL and <= 2 ng/mL: Systemic infection (sepsis) is possible, but other conditions are known to elevate PCT as well. (NOTE)       Sepsis PCT Algorithm           Lower Respiratory Tract                                      Infection PCT Algorithm    ----------------------------     ----------------------------         PCT < 0.25 ng/mL                PCT < 0.10 ng/mL         Strongly encourage             Strongly discourage   discontinuation of antibiotics    initiation of antibiotics    ----------------------------     -----------------------------       PCT 0.25 - 0.50 ng/mL            PCT 0.10 - 0.25 ng/mL               OR       >80% decrease in PCT            Discourage initiation of                                            antibiotics      Encourage discontinuation           of antibiotics    ----------------------------     -----------------------------         PCT >= 0.50 ng/mL              PCT 0.26 - 0.50 ng/mL                AND       <80% decrease in PCT             Encourage initiation of                                             antibiotics       Encourage continuation           of antibiotics    ----------------------------     -----------------------------        PCT >= 0.50 ng/mL                  PCT > 0.50 ng/mL               AND         increase in PCT                  Strongly encourage  initiation of antibiotics    Strongly encourage escalation           of antibiotics                                     -----------------------------                                           PCT <= 0.25 ng/mL                                                 OR                                         > 80% decrease in PCT                                     Discontinue / Do not initiate                                             antibiotics Performed at [facility], [address] [place], [place] [address]   Lactate dehydrogenase     Status: Abnormal   Collection Time: 08/04/2019  9:59 AM  Result Value Ref Range   LDH 207 (H) 98 - 192 U/L    Comment: Performed at [facility] [address] [place], [place] [address]  Ferritin     Status: Abnormal   Collection Time: 08/05/2019  9:59 AM  Result Value Ref Range   Ferritin 1,544 (H) 24 - 336 ng/mL    Comment: Performed at [facility] [address] [place], [place] [address]  Fibrinogen     Status: Abnormal   Collection Time: 08/09/2019  9:59 AM  Result Value Ref Range   Fibrinogen 620 (H) 210 - 475 mg/dL    Comment: Performed at [facility] [address] [place], [place] [address]  C-reactive protein     Status: Abnormal   Collection Time: 08/06/2019  9:59 AM  Result Value Ref Range   CRP 10.3 (H) <1.0 mg/dL    Comment: Performed at [facility] [address] [place], [place] [address]  Lactic acid, plasma     Status: None   Collection Time: 08/09/2019 10:00 AM  Result Value Ref Range   Lactic Acid, Venous 1.5 0.5 - 1.9 mmol/L    Comment: Performed at [facility] [address], [place], [place] [address]  Triglycerides     Status: None   Collection Time: 07/27/2019 10:00 AM  Result Value Ref Range   Triglycerides 94 <150 mg/dL    Comment: Performed at [facility] [address], [place], [place] [address]  POC SARS Coronavirus 2 Ag-ED - Nasal Swab (BD Veritor Kit)     Status: Abnormal   Collection Time:  08/01/2019 11:25 AM  Result Value Ref Range   SARS Coronavirus 2 Ag POSITIVE (A) NEGATIVE    Comment: (NOTE) SARS-CoV-2 antigen PRESENT. Positive results indicate the presence of viral antigens, but clinical correlation with patient history and other  diagnostic information is necessary to determine patient infection status.  Positive results do not rule out bacterial infection or co-infection  with other viruses. False positive results are rare but can occur, and confirmatory RT-PCR testing may be appropriate in some circumstances. The expected result is Negative. Fact Sheet for Patients: PodPark.tn Fact Sheet for Providers: GiftContent.is  This test is not yet approved or cleared by the [place] FDA and  has been authorized for detection and/or diagnosis of SARS-CoV-2 by FDA under an Emergency Use Authorization (EUA).  This EUA will remain in effect (meaning this test can be used) for the duration of  the COVID-19 declaration under Section 564(b)(1) of the Act, 21 U.S.C. section 360bbb-3(b)(1), unless the a uthorization is terminated or revoked sooner.   Lactic acid, plasma     Status: Abnormal   Collection Time: 08/19/2019 11:57 AM  Result Value Ref Range   Lactic Acid, Venous 2.0 (HH) 0.5 - 1.9 mmol/L    Comment: CRITICAL RESULT CALLED TO, READ BACK BY AND VERIFIED WITH: RN N Lexiana Spindel AT 1236 08/06/2019 BY L BENFIELD Performed at [facility], [place] [address] [place], [place] [address]   Troponin I (High Sensitivity)     Status: Abnormal   Collection Time: 08/19/2019 11:57 AM  Result Value Ref Range   Troponin I (High Sensitivity) 79 (H) <18 ng/L    Comment: (NOTE) Elevated high sensitivity troponin I (hsTnI) values and significant  changes across serial measurements may suggest ACS but many other  chronic and acute conditions are known to elevate hsTnI results.  Refer to the Links section for chest pain algorithms and additional  guidance. Performed at [facility], [place] [address], [place], [place] [address]   POC CBG, ED     Status: None   Collection Time: 07/26/2019  1:24 PM  Result Value Ref Range   Glucose-Capillary 99 70 - 99 mg/dL  I-STAT 7,  (LYTES, BLD GAS, ICA, H+H)     Status: Abnormal   Collection Time: 08/05/2019  2:49 PM  Result Value Ref Range   pH, Arterial 7.248 (L) 7.350 - 7.450   pCO2 arterial 62.0 (H) 32.0 - 48.0 mmHg   pO2, Arterial 76.0 (L) 83.0 - 108.0 mmHg   Bicarbonate 27.1 20.0 - 28.0 mmol/L   TCO2 29 22 - 32 mmol/L   O2 Saturation 92.0 %   Acid-base deficit 1.0 0.0 - 2.0 mmol/L   Sodium 122 (L) 135 - 145 mmol/L   Potassium >8.5 (HH) 3.5 - 5.1 mmol/L   Calcium, Ion 1.12 (L) 1.15 - 1.40 mmol/L   HCT 36.0 (L) 39.0 - 52.0 %   Hemoglobin 12.2 (L) 13.0 - 17.0 g/dL   Patient temperature 98.6 F    Collection site RADIAL, ALLEN'S TEST ACCEPTABLE    Drawn by Operator    Sample type ARTERIAL    Comment NOTIFIED PHYSICIAN    Dg Chest Portable 1 View  Result Date: 08/13/2019 CLINICAL DATA:  Shortness of breath. COVID positive. EXAM: PORTABLE CHEST 1 VIEW COMPARISON:  08/10/2019 FINDINGS: 1311 hours. Endotracheal tube tip is 5 cm above the base of the carina. Patchy airspace disease noted bilaterally, and progressive at the right base. Cardiopericardial silhouette is at upper limits of normal  for size. The visualized bony structures of the thorax are intact. Telemetry leads overlie the chest. IMPRESSION: 1. Endotracheal tube tip is 5 cm above the base of the carina. 2. Patchy bilateral airspace disease, progressive at the right base. Electronically Signed   By: Misty Stanley M.D.   On: 08/08/2019 13:31   Dg Chest Port 1 View  Result Date: 08/17/2019 CLINICAL DATA:  58 year old male with shortness of breath and weakness. Sent home from dialysis center with fever. EXAM: PORTABLE CHEST 1 VIEW COMPARISON:  Prior chest x-ray 09/19/2016 FINDINGS: Cardiomegaly. Mediastinal contours are within normal limits. Increased pulmonary vascular congestion now with diffuse bilateral mild interstitial airspace opacities most consistent with mild pulmonary edema. Trace right pleural effusion. No pneumothorax. Overlapping stents visualized  in the axillary vein on the left. No acute osseous abnormality. IMPRESSION: 1. Cardiomegaly with imaging findings most consistent with mild pulmonary edema. Suspect volume overload in this patient with a history of end-stage renal disease on hemodialysis. 2. In the appropriate clinical setting, the airspace opacities could also reflect viral pneumonia. Electronically Signed   By: Jacqulynn Cadet M.D.   On: 08/07/2019 10:42    Pending Labs Unresulted Labs (From admission, onward)    Start     Ordered   08/12/19 0500  CBC with Differential/Platelet  Daily,   R     08/02/2019 1237   08/12/19 0500  Comprehensive metabolic panel  Daily,   R     08/18/2019 1237   08/12/19 0500  C-reactive protein  Daily,   R     08/15/2019 1237   08/12/19 0500  D-dimer, quantitative (not at [facility])  Daily,   R     07/24/2019 1237   08/12/19 0500  Ferritin  Daily,   R     07/29/2019 1237   08/12/19 0500  Magnesium  Daily,   R     07/26/2019 1237   08/12/19 0500  Phosphorus  Daily,   R     08/17/2019 1237   08/12/19 0500  Blood gas, arterial  Tomorrow morning,   R     08/01/2019 1351   08/12/19 0500  Triglycerides  (propofol (DIPRIVAN))  Daily,   R    Comments: While on propofol (DIPRIVAN)    08/18/2019 1355   07/26/2019 1347  CBC  (heparin)  Once,   STAT    Comments: Baseline for heparin therapy IF NOT ALREADY DRAWN.  Notify MD if PLT < 100 K.    08/18/2019 1351   08/19/2019 1347  Creatinine, serum  (heparin)  Once,   STAT    Comments: Baseline for heparin therapy IF NOT ALREADY DRAWN.    08/05/2019 1351   07/31/2019 1235  HIV Antibody (routine testing w rflx)  (HIV Antibody (Routine testing w reflex) panel)  Once,   STAT     08/05/2019 1237   07/30/2019 0942  Blood Culture (routine x 2)  BLOOD CULTURE X 2,   STAT     08/15/2019 0942   Signed and Held  Renal function panel  Once,   R     Signed and Held   Signed and Held  CBC  Once,   R     Signed and Held          Vitals/Pain Today's Vitals   08/09/2019 1345 08/18/2019 1356  08/09/2019 1400 08/15/2019 1415  BP: (!) 184/114 (!) 183/117 (!) 184/113 (!) 159/98  Pulse: 98 96 96 91  Resp: (!) 24 (!) 24 (!) 24 (!)  24  Temp:      TempSrc:      SpO2: 96% 95% 93% 94%  Weight:      Height:      PainSc:        Isolation Precautions Airborne and Contact precautions  Medications Medications  sodium zirconium cyclosilicate (LOKELMA) packet 10 g (10 g Oral Given 07/29/2019 1222)  Chlorhexidine Gluconate Cloth 2 % PADS 6 each (has no administration in time range)  pentafluoroprop-tetrafluoroeth (GEBAUERS) aerosol 1 application (has no administration in time range)  lidocaine (PF) (XYLOCAINE) 1 % injection 5 mL (has no administration in time range)  lidocaine-prilocaine (EMLA) cream 1 application (has no administration in time range)  heparin injection 1,000 Units (has no administration in time range)  alteplase (CATHFLO ACTIVASE) injection 2 mg (has no administration in time range)  heparin injection 1,200 Units (has no administration in time range)  doxercalciferol (HECTOROL) injection 7 mcg (has no administration in time range)  dextrose 50 % solution 50 mL (has no administration in time range)  insulin aspart (novoLOG) injection 5 Units (has no administration in time range)  heparin 1000 UNIT/ML injection (has no administration in time range)  doxercalciferol (HECTOROL) 4 MCG/2ML injection (has no administration in time range)  aspirin EC tablet 81 mg (has no administration in time range)  sucroferric oxyhydroxide (VELPHORO) chewable tablet 500 mg (has no administration in time range)  sodium chloride flush (NS) 0.9 % injection 3 mL (has no administration in time range)  sodium chloride flush (NS) 0.9 % injection 3 mL (has no administration in time range)  0.9 %  sodium chloride infusion (has no administration in time range)  acetaminophen (TYLENOL) tablet 650 mg (has no administration in time range)  docusate sodium (COLACE) capsule 100 mg (has no administration in  time range)  ondansetron (ZOFRAN) tablet 4 mg (has no administration in time range)    Or  ondansetron (ZOFRAN) injection 4 mg (has no administration in time range)  zolpidem (AMBIEN) tablet 5 mg (has no administration in time range)  sorbitol 70 % solution 30 mL (has no administration in time range)  docusate sodium (ENEMEEZ) enema 283 mg (has no administration in time range)  camphor-menthol (SARNA) lotion 1 application (has no administration in time range)    And  hydrOXYzine (ATARAX/VISTARIL) tablet 25 mg (has no administration in time range)  calcium carbonate (dosed in mg elemental calcium) suspension 500 mg of elemental calcium (has no administration in time range)  feeding supplement (NEPRO CARB STEADY) liquid 237 mL (has no administration in time range)  heparin injection 5,000 Units (has no administration in time range)  albuterol (VENTOLIN HFA) 108 (90 Base) MCG/ACT inhaler 2 puff (has no administration in time range)  methylPREDNISolone sodium succinate (SOLU-MEDROL) 125 mg/2 mL injection 40.625 mg (40.625 mg Intravenous Given 07/22/2019 1318)  guaiFENesin-dextromethorphan (ROBITUSSIN DM) 100-10 MG/5ML syrup 10 mL (has no administration in time range)  remdesivir 200 mg in sodium chloride 0.9 % 250 mL IVPB (has no administration in time range)    Followed by  remdesivir 100 mg in sodium chloride 0.9 % 250 mL IVPB (has no administration in time range)  lactated ringers infusion (has no administration in time range)  feeding supplement (PRO-STAT SUGAR FREE 64) liquid 30 mL (has no administration in time range)  feeding supplement (VITAL HIGH PROTEIN) liquid 1,000 mL (has no administration in time range)  propofol (DIPRIVAN) 1000 MG/100ML infusion (25 mcg/kg/min  81.3 kg Intravenous Rate/Dose Change 07/28/2019 1458)  multivitamin liquid  15 mL (has no administration in time range)  fentaNYL (SUBLIMAZE) injection 50 mcg (has no administration in time range)  fentaNYL 2553mg in NS 2551m (1055mml) infusion-PREMIX (has no administration in time range)  fentaNYL (SUBLIMAZE) bolus via infusion 50 mcg (has no administration in time range)  HYDROmorphone (DILAUDID) injection 0.5 mg (0.5 mg Intravenous Given 08/19/2019 1048)  dextrose 50 % solution 50 mL (50 mLs Intravenous Given 08/15/2019 1143)  calcium gluconate 1 g/ 50 mL sodium chloride IVPB (0 mg Intravenous Stopped 07/25/2019 1355)  furosemide (LASIX) injection 40 mg (40 mg Intravenous Given 08/15/2019 1310)  etomidate (AMIDATE) injection (30 mg Intravenous Given 08/07/2019 1307)  rocuronium (ZEMURON) injection (50 mg Intravenous Given 08/03/2019 1308)    Mobility non-ambulatory     Focused Assessments Pulmonary Assessment Handoff:  Lung sounds: Bilateral Breath Sounds: Rhonchi L Breath Sounds: Rhonchi R Breath Sounds: Rhonchi O2 Device: Ventilator O2 Flow Rate (L/min): 2 L/min      R Recommendations: See Admitting Provider Note  Report given to:   Additional Notes:

## 2019-08-11 NOTE — Progress Notes (Signed)
Called by RT.  Patient has a cuff leak, ETT is hubbed and report is that it is too high.  Check cuff truly appears ruptured.  Bronched patient, ETT is above carina and seems to be curved in the mouth.  ETT was exchanged and verified bronchoscopically.  CXR ordered.  Patient stable throughout but was certainly a high aerosolizing process even with turning the vent off when circuit is broken.  The patient is critically ill with multiple organ systems failure and requires high complexity decision making for assessment and support, frequent evaluation and titration of therapies, application of advanced monitoring technologies and extensive interpretation of multiple databases.   Critical Care Time devoted to patient care services described in this note is  45  Minutes. This time reflects time of care of this signee Dr Jennet Maduro. This critical care time does not reflect procedure time, or teaching time or supervisory time of PA/NP/Med student/Med Resident etc but could involve care discussion time.  Rush Farmer, M.D. [facility] Pulmonary/Critical Care Medicine.

## 2019-08-11 NOTE — ED Notes (Addendum)
Updated wife Vertis Kelch" [phone]) and sister Veronda Prude" [phone].  Sister appears to have some medical knowledge.

## 2019-08-11 NOTE — ED Provider Notes (Signed)
Procedure Name: Intubation Date/Time: 08/01/2019 1:21 PM Performed by: Courtney Paris, MD Pre-anesthesia Checklist: Patient identified, Emergency Drugs available, Suction available, Patient being monitored and Timeout performed Oxygen Delivery Method: Non-rebreather mask Preoxygenation: pre oxygenate at 61% Induction Type: Rapid sequence Laryngoscope Size: Glidescope Grade View: Grade I Tube size: 7.5 mm Number of attempts: 1 Placement Confirmation: ETT inserted through vocal cords under direct vision,  CO2 detector,  Positive ETCO2 and Breath sounds checked- equal and bilateral Secured at: 25 cm Tube secured with: ETT holder Dental Injury: Teeth and Oropharynx as per pre-operative assessment  Comments: Patient intubated for respiratory failure with Covid and likely pulmonary edema.  Extensive pulmonary secretions requiring multiple suctioning's during intubation.  Oxygen saturations were very low in the 60s preintubation and dropped precipitously.  After intubation, oxygen saturation improved into the 90s.  Postintubation x-ray showed correct placement of the tube on my bedside review.  Question if there was a bulb malfunction on the ET tube,  respiratory therapy is going to monitor this and see if he needs to be exchanged with critical care.         Tegeler, Gwenyth Allegra, MD 07/24/2019 1323

## 2019-08-11 NOTE — Procedures (Signed)
Bronchoscopy Procedure Note Azeem Poorman [id number] 09/05/1961  Procedure: Bronchoscopy Indications: Diagnostic evaluation of the airways and Remove secretions  Procedure Details Consent: Unable to obtain consent because of emergent medical necessity. Time Out: Verified patient identification, verified procedure, site/side was marked, verified correct patient position, special equipment/implants available, medications/allergies/relevent history reviewed, required imaging and test results available.  Performed  In preparation for procedure, patient was given 100% FiO2 and bronchoscope lubricated. Sedation: Propofol and fentanyl  Airway entered and the following bronchi were examined: RUL, RML, RLL, LUL, LLL and Bronchi.   Frothy material throughout the entire bronchial tree Bronchoscope removed.  , Patient placed back on 100% FiO2 at conclusion of procedure.    Evaluation Hemodynamic Status: BP stable throughout; O2 sats: stable throughout Patient's Current Condition: stable Specimens:  None Complications: No apparent complications Patient did tolerate procedure well.   Jennet Maduro 07/24/2019

## 2019-08-11 NOTE — Consult Note (Signed)
[facility]  HISTORY AND PHYSICAL  Jonathan Horton is an 58 y.o. male.    Chief Complaint:  SOB and fever  HPI: Pt is a 33M with PMH significant for HTN, ESRD on HD MWF at [facility], DM who is now seen in consultation at the request of EDP management of ESRD and provision of HD.  Pt has been feeling poorly for the past 3 days.  Was febrile on pre- dialysis vitals eval 11/20 and was sent out to UC to get a COVID test.  He returns to ED today with worsening SOB.  COVID +.  Labs with hyperkalemia of 7.1.  Medically treated with calcium, insulin/ dextrose, and Lokelma.  Developed respiratory distress in ED and was intubated.  Lots of frothy secretions from tube per report.  Repeat labs on ABG with K > 8.5.    Last scheduled HD was 11/18.  Completed full session and got to EDW.    DIALYSIS ORDERS:  [facility] MWF 4 hrs EDW 81.5 F180, BFR 400 AVF, Autoflow 1.5 2K/ 2.25 Ca bath UF profile 4 Hectorol 7 mcg q rx Mircera 75 mcg q 4 weeks, last given 11/2 Binders are Velphoro 2 TID [facility]    PMH: Past Medical History:  Diagnosis Date  . Anemia   . Chronic kidney disease   . Diabetes mellitus without complication ([place])   . History of blood transfusion 05/2016  . Hypertension    PSH: Past Surgical History:  Procedure Laterality Date  . AV FISTULA PLACEMENT Left 06/08/2016   Procedure: ARTERIOVENOUS (AV) FISTULA CREATION;  Surgeon: Angelia Mould, MD;  Location: [place];  Service: Vascular;  Laterality: Left;  . INSERTION OF DIALYSIS CATHETER Right 06/08/2016   Procedure: INSERTION OF DIALYSIS CATHETER;  Surgeon: Angelia Mould, MD;  Location: [place];  Service: Vascular;  Laterality: Right;   Past Medical History:  Diagnosis Date  . Anemia   . Chronic kidney disease   . Diabetes mellitus without complication ([place])   . History of blood transfusion 05/2016  . Hypertension     Medications:  Prior to Admission:   From OP dialysis unit records: -Carvedilol 25 mg BID -  sensipar 30 mg daily - tramadol 50 mg q 6 prn - Velphoro 500 mg 2 TID AC  ALLERGIES:  No Known Allergies  FAM HX: Family History  Problem Relation Age of Onset  . Healthy Mother   . Healthy Father   . Colon cancer Neg Hx   . Esophageal cancer Neg Hx   . Rectal cancer Neg Hx   . Stomach cancer Neg Hx     Social History:   reports that he has never smoked. He has never used smokeless tobacco. He reports that he does not drink alcohol or use drugs.  ROS: ROS: not obtainable d/t intubation  Blood pressure (!) 165/96, pulse 96, temperature 98.7 F (37.1 C), temperature source Oral, resp. rate 18, height _0  (1.803 m), weight 81.3 kg, SpO2 (!) 89 %. PHYSICAL EXAM: Physical Exam physical exam not performed to preserve PPE and limit exposure to other providers   Results for orders placed or performed during the hospital encounter of 08/07/2019 (from the past 48 hour(s))  Troponin I (High Sensitivity)     Status: Abnormal   Collection Time: 07/31/2019  9:59 AM  Result Value Ref Range   Troponin I (High Sensitivity) 101 (HH) <18 ng/L    Comment: CRITICAL RESULT CALLED TO, READ BACK BY AND VERIFIED WITH: RN N  ZOHBI AT 1133 08/07/2019 BY L BENFIELD (NOTE) Elevated high sensitivity troponin I (hsTnI) values and significant  changes across serial measurements may suggest ACS but many other  chronic and acute conditions are known to elevate hsTnI results.  Refer to the Links section for chest pain algorithms and additional  guidance. Performed at [facility], [place] [address], [place], [place] [address]   CBC WITH DIFFERENTIAL     Status: Abnormal   Collection Time: 07/25/2019  9:59 AM  Result Value Ref Range   WBC 7.1 4.0 - 10.5 K/uL   RBC 3.31 (L) 4.22 - 5.81 MIL/uL   Hemoglobin 10.9 (L) 13.0 - 17.0 g/dL   HCT 32.8 (L) 39.0 - 52.0 %   MCV 99.1 80.0 - 100.0 fL   MCH 32.9 26.0 - 34.0 pg   MCHC 33.2 30.0 - 36.0 g/dL   RDW 12.4 11.5 - 15.5 %   Platelets 298 150 - 400 K/uL    nRBC 0.0 0.0 - 0.2 %   Neutrophils Relative % 84 %   Neutro Abs 5.9 1.7 - 7.7 K/uL   Lymphocytes Relative 10 %   Lymphs Abs 0.7 0.7 - 4.0 K/uL   Monocytes Relative 6 %   Monocytes Absolute 0.4 0.1 - 1.0 K/uL   Eosinophils Relative 0 %   Eosinophils Absolute 0.0 0.0 - 0.5 K/uL   Basophils Relative 0 %   Basophils Absolute 0.0 0.0 - 0.1 K/uL   Immature Granulocytes 0 %   Abs Immature Granulocytes 0.03 0.00 - 0.07 K/uL    Comment: Performed at [facility], [address], [place], [place] [address]  Comprehensive metabolic panel     Status: Abnormal   Collection Time: 07/30/2019  9:59 AM  Result Value Ref Range   Sodium 130 (L) 135 - 145 mmol/L   Potassium 7.1 (HH) 3.5 - 5.1 mmol/L    Comment: CRITICAL RESULT CALLED TO, READ BACK BY AND VERIFIED WITH: RN N ZOHBI AT 1133 08/09/2019 BY L BENFIELD NO VISIBLE HEMOLYSIS    Chloride 89 (L) 98 - 111 mmol/L   CO2 25 22 - 32 mmol/L   Glucose, Bld 52 (L) 70 - 99 mg/dL   BUN 95 (H) 6 - 20 mg/dL   Creatinine, Ser 18.72 (H) 0.61 - 1.24 mg/dL   Calcium 9.2 8.9 - 10.3 mg/dL   Total Protein 7.4 6.5 - 8.1 g/dL   Albumin 3.2 (L) 3.5 - 5.0 g/dL   AST 19 15 - 41 U/L   ALT 12 0 - 44 U/L   Alkaline Phosphatase 56 38 - 126 U/L   Total Bilirubin 0.9 0.3 - 1.2 mg/dL   GFR calc non Af Amer 2 (L) >60 mL/min   GFR calc Af Amer 3 (L) >60 mL/min   Anion gap 16 (H) 5 - 15    Comment: Performed at [facility], [place] [address], [place], [place] [address]  D-dimer, quantitative     Status: Abnormal   Collection Time: 08/17/2019  9:59 AM  Result Value Ref Range   D-Dimer, Quant 0.58 (H) 0.00 - 0.50 ug/mL-FEU    Comment: (NOTE) At the manufacturer cut-off of 0.50 ug/mL FEU, this assay has been documented to exclude PE with a sensitivity and negative predictive value of 97 to 99%.  At this time, this assay has not been approved by the FDA to exclude DVT/VTE. Results should be correlated with clinical presentation. Performed at [facility], [place]  [address], [place], [place] [address]   Procalcitonin     Status: None   Collection Time: 07/23/2019  9:59 AM  Result Value Ref Range   Procalcitonin 0.55 ng/mL    Comment:        Interpretation: PCT > 0.5 ng/mL and <= 2 ng/mL: Systemic infection (sepsis) is possible, but other conditions are known to elevate PCT as well. (NOTE)       Sepsis PCT Algorithm           Lower Respiratory Tract                                      Infection PCT Algorithm    ----------------------------     ----------------------------         PCT < 0.25 ng/mL                PCT < 0.10 ng/mL         Strongly encourage             Strongly discourage   discontinuation of antibiotics    initiation of antibiotics    ----------------------------     -----------------------------       PCT 0.25 - 0.50 ng/mL            PCT 0.10 - 0.25 ng/mL               OR       >80% decrease in PCT            Discourage initiation of                                            antibiotics      Encourage discontinuation           of antibiotics    ----------------------------     -----------------------------         PCT >= 0.50 ng/mL              PCT 0.26 - 0.50 ng/mL                AND       <80% decrease in PCT             Encourage initiation of                                             antibiotics       Encourage continuation           of antibiotics    ----------------------------     -----------------------------        PCT >= 0.50 ng/mL                  PCT > 0.50 ng/mL               AND         increase in PCT                  Strongly encourage  initiation of antibiotics    Strongly encourage escalation           of antibiotics                                     -----------------------------                                           PCT <= 0.25 ng/mL                                                 OR                                        > 80% decrease in PCT                                      Discontinue / Do not initiate                                             antibiotics Performed at [facility], [address] [place], [place] [address]   Lactate dehydrogenase     Status: Abnormal   Collection Time: 07/23/2019  9:59 AM  Result Value Ref Range   LDH 207 (H) 98 - 192 U/L    Comment: Performed at [facility] [address] [place], [place] [address]  Ferritin     Status: Abnormal   Collection Time: 08/09/2019  9:59 AM  Result Value Ref Range   Ferritin 1,544 (H) 24 - 336 ng/mL    Comment: Performed at [facility] [address], [place], [place] [address]  Fibrinogen     Status: Abnormal   Collection Time: 08/19/2019  9:59 AM  Result Value Ref Range   Fibrinogen 620 (H) 210 - 475 mg/dL    Comment: Performed at [facility] [address], [place], [place] [address]  C-reactive protein     Status: Abnormal   Collection Time: 08/10/2019  9:59 AM  Result Value Ref Range   CRP 10.3 (H) <1.0 mg/dL    Comment: Performed at [facility] [address], [place], [place] [address]  Lactic acid, plasma     Status: None   Collection Time: 08/17/2019 10:00 AM  Result Value Ref Range   Lactic Acid, Venous 1.5 0.5 - 1.9 mmol/L    Comment: Performed at [facility] [address] [place], [place] [address]  Triglycerides     Status: None   Collection Time: 08/14/2019 10:00 AM  Result Value Ref Range   Triglycerides 94 <150 mg/dL    Comment: Performed at [facility] [address], [place], [place] [address]  POC SARS Coronavirus 2 Ag-ED - Nasal Swab (BD Veritor Kit)     Status: Abnormal   Collection Time:  08/12/2019 11:25 AM  Result Value Ref Range   SARS Coronavirus 2 Ag POSITIVE (A) NEGATIVE    Comment: (NOTE) SARS-CoV-2 antigen PRESENT. Positive results indicate the presence of viral antigens, but clinical correlation with patient history and other diagnostic information is necessary to determine  patient infection status.  Positive results do not rule out bacterial infection or co-infection  with other viruses. False positive results are rare but can occur, and confirmatory RT-PCR testing may be appropriate in some circumstances. The expected result is Negative. Fact Sheet for Patients: PodPark.tn Fact Sheet for Providers: GiftContent.is  This test is not yet approved or cleared by the [place] FDA and  has been authorized for detection and/or diagnosis of SARS-CoV-2 by FDA under an Emergency Use Authorization (EUA).  This EUA will remain in effect (meaning this test can be used) for the duration of  the COVID-19 declaration under Section 564(b)(1) of the Act, 21 U.S.C. section 360bbb-3(b)(1), unless the a uthorization is terminated or revoked sooner.   Lactic acid, plasma     Status: Abnormal   Collection Time: 07/31/2019 11:57 AM  Result Value Ref Range   Lactic Acid, Venous 2.0 (HH) 0.5 - 1.9 mmol/L    Comment: CRITICAL RESULT CALLED TO, READ BACK BY AND VERIFIED WITH: RN N ZOHBI AT 1236 08/14/2019 BY L BENFIELD Performed at [facility], [place] [address] [place], [place] [address]   Troponin I (High Sensitivity)     Status: Abnormal   Collection Time: 08/06/2019 11:57 AM  Result Value Ref Range   Troponin I (High Sensitivity) 79 (H) <18 ng/L    Comment: (NOTE) Elevated high sensitivity troponin I (hsTnI) values and significant  changes across serial measurements may suggest ACS but many other  chronic and acute conditions are known to elevate hsTnI results.  Refer to the Links section for chest pain algorithms and additional  guidance. Performed at [facility], [place] [address], [place], [place] [address]   POC CBG, ED     Status: None   Collection Time: 08/15/2019  1:24 PM  Result Value Ref Range   Glucose-Capillary 99 70 - 99 mg/dL    Dg Chest Portable 1 View  Result Date:  08/09/2019 CLINICAL DATA:  Shortness of breath. COVID positive. EXAM: PORTABLE CHEST 1 VIEW COMPARISON:  08/15/2019 FINDINGS: 1311 hours. Endotracheal tube tip is 5 cm above the base of the carina. Patchy airspace disease noted bilaterally, and progressive at the right base. Cardiopericardial silhouette is at upper limits of normal for size. The visualized bony structures of the thorax are intact. Telemetry leads overlie the chest. IMPRESSION: 1. Endotracheal tube tip is 5 cm above the base of the carina. 2. Patchy bilateral airspace disease, progressive at the right base. Electronically Signed   By: Misty Stanley M.D.   On: 07/24/2019 13:31   Dg Chest Port 1 View  Result Date: 08/13/2019 CLINICAL DATA:  58 year old male with shortness of breath and weakness. Sent home from dialysis center with fever. EXAM: PORTABLE CHEST 1 VIEW COMPARISON:  Prior chest x-ray 09/19/2016 FINDINGS: Cardiomegaly. Mediastinal contours are within normal limits. Increased pulmonary vascular congestion now with diffuse bilateral mild interstitial airspace opacities most consistent with mild pulmonary edema. Trace right pleural effusion. No pneumothorax. Overlapping stents visualized in the axillary vein on the left. No acute osseous abnormality. IMPRESSION: 1. Cardiomegaly with imaging findings most consistent with mild pulmonary edema. Suspect volume overload in this patient with a history of end-stage renal disease on  hemodialysis. 2. In the appropriate clinical setting, the airspace opacities could also reflect viral pneumonia. Electronically Signed   By: Jacqulynn Cadet M.D.   On: 08/14/2019 10:42    Assessment/Plan 1.  COVID +: hypoxic, intubated, getting remdesivir  2.  ESRD: MWF, missed yesterday, will emergently dialyze in ICU for vol/ hyperK  3.  Hyperkalemia: K > 8.5, will plan to dialyze on 1K bath for at least 1 hr, continue lokelma 10 TID as well  4.  Anemia: Last ESA given 11/2, last OP Hgb 11.0  5.  Bone/  mineral: velphoro 2 TID AC as binder when getting TF, continue renavite as binder  6.  Nutrition: Last albumin 3.9  7.  Dispo: in ICU  Madelon Lips 07/23/2019, 2:13 PM

## 2019-08-11 NOTE — Progress Notes (Signed)
Pharmacy Consult - Remdesivir  67 yom presenting with SOB, found to be COVID+. Patient requiring supplemental oxygen. Pharmacy consulted to dose Remdesivir. ALT<220.  Plan: Remdesivir 200mg  IV x 1; then 100mg  IV daily to complete 5 total doses Monitor clinical progress, LFTs

## 2019-08-11 NOTE — Procedures (Signed)
Intubation Procedure Note Jonathan Horton [id number] 12-23-1960  Procedure: Intubation Indications: Respiratory insufficiency  Procedure Details Consent: Unable to obtain consent because of emergent medical necessity. Time Out: Verified patient identification, verified procedure, site/side was marked, verified correct patient position, special equipment/implants available, medications/allergies/relevent history reviewed, required imaging and test results available.  Performed  Maximum sterile technique was used including antiseptics, cap, gloves, gown, hand hygiene, mask and sheet.   Vent turned off.  Tube exchanger placed, cuff deflated, old ETT removed, new ETT placed, cuff inflated, patient connected back to vent and restarted ventilation.  Evaluation Hemodynamic Status: BP stable throughout; O2 sats: stable throughout Patient's Current Condition: stable Complications: No apparent complications Patient did tolerate procedure well. Chest X-ray ordered to verify placement.  CXR: pending.   Jennet Maduro 08/10/2019

## 2019-08-11 NOTE — ED Triage Notes (Signed)
Pt arrives POV from home c/o shortness of breath and weakness. Pt is a Mon, Wed, Fri dialysis pt.  Pt states Wednesday was his last dialysis tx; pt reports dialysis center sent pt home due to fever. Pt denies exposure to COVID.

## 2019-08-11 NOTE — ED Notes (Signed)
POC CoV2 test Pos, Called to [facility].

## 2019-08-12 ENCOUNTER — Inpatient Hospital Stay (HOSPITAL_COMMUNITY): Payer: Medicare Other

## 2019-08-12 DIAGNOSIS — Z992 Dependence on renal dialysis: Secondary | ICD-10-CM

## 2019-08-12 DIAGNOSIS — E1122 Type 2 diabetes mellitus with diabetic chronic kidney disease: Secondary | ICD-10-CM

## 2019-08-12 DIAGNOSIS — N186 End stage renal disease: Secondary | ICD-10-CM

## 2019-08-12 LAB — COMPREHENSIVE METABOLIC PANEL
ALT: 16 U/L (ref 0–44)
AST: 24 U/L (ref 15–41)
Albumin: 3 g/dL — ABNORMAL LOW (ref 3.5–5.0)
Alkaline Phosphatase: 48 U/L (ref 38–126)
Anion gap: 19 — ABNORMAL HIGH (ref 5–15)
BUN: 60 mg/dL — ABNORMAL HIGH (ref 6–20)
CO2: 22 mmol/L (ref 22–32)
Calcium: 8.4 mg/dL — ABNORMAL LOW (ref 8.9–10.3)
Chloride: 91 mmol/L — ABNORMAL LOW (ref 98–111)
Creatinine, Ser: 12.72 mg/dL — ABNORMAL HIGH (ref 0.61–1.24)
GFR calc Af Amer: 4 mL/min — ABNORMAL LOW (ref >60)
GFR calc non Af Amer: 4 mL/min — ABNORMAL LOW (ref >60)
Glucose, Bld: 226 mg/dL — ABNORMAL HIGH (ref 70–99)
Potassium: 6.1 mmol/L — ABNORMAL HIGH (ref 3.5–5.1)
Sodium: 132 mmol/L — ABNORMAL LOW (ref 135–145)
Total Bilirubin: 0.7 mg/dL (ref 0.3–1.2)
Total Protein: 6.9 g/dL (ref 6.5–8.1)

## 2019-08-12 LAB — CBC WITH DIFFERENTIAL/PLATELET
Abs Immature Granulocytes: 0.04 10*3/uL (ref 0.00–0.07)
Basophils Absolute: 0 10*3/uL (ref 0.0–0.1)
Basophils Relative: 0 %
Eosinophils Absolute: 0 10*3/uL (ref 0.0–0.5)
Eosinophils Relative: 0 %
HCT: 32.4 % — ABNORMAL LOW (ref 39.0–52.0)
Hemoglobin: 10.5 g/dL — ABNORMAL LOW (ref 13.0–17.0)
Immature Granulocytes: 0 %
Lymphocytes Relative: 3 %
Lymphs Abs: 0.3 10*3/uL — ABNORMAL LOW (ref 0.7–4.0)
MCH: 32.5 pg (ref 26.0–34.0)
MCHC: 32.4 g/dL (ref 30.0–36.0)
MCV: 100.3 fL — ABNORMAL HIGH (ref 80.0–100.0)
Monocytes Absolute: 0.3 10*3/uL (ref 0.1–1.0)
Monocytes Relative: 2 %
Neutro Abs: 9.9 10*3/uL — ABNORMAL HIGH (ref 1.7–7.7)
Neutrophils Relative %: 95 %
Platelets: 216 10*3/uL (ref 150–400)
RBC: 3.23 MIL/uL — ABNORMAL LOW (ref 4.22–5.81)
RDW: 12.5 % (ref 11.5–15.5)
WBC: 10.5 10*3/uL (ref 4.0–10.5)
nRBC: 0 % (ref 0.0–0.2)

## 2019-08-12 LAB — BASIC METABOLIC PANEL
Anion gap: 15 (ref 5–15)
BUN: 82 mg/dL — ABNORMAL HIGH (ref 6–20)
CO2: 24 mmol/L (ref 22–32)
Calcium: 8 mg/dL — ABNORMAL LOW (ref 8.9–10.3)
Chloride: 92 mmol/L — ABNORMAL LOW (ref 98–111)
Creatinine, Ser: 12.67 mg/dL — ABNORMAL HIGH (ref 0.61–1.24)
GFR calc Af Amer: 4 mL/min — ABNORMAL LOW (ref >60)
GFR calc non Af Amer: 4 mL/min — ABNORMAL LOW (ref >60)
Glucose, Bld: 230 mg/dL — ABNORMAL HIGH (ref 70–99)
Potassium: 5.5 mmol/L — ABNORMAL HIGH (ref 3.5–5.1)
Sodium: 131 mmol/L — ABNORMAL LOW (ref 135–145)

## 2019-08-12 LAB — FERRITIN: Ferritin: 1993 ng/mL — ABNORMAL HIGH (ref 24–336)

## 2019-08-12 LAB — POCT I-STAT 7, (LYTES, BLD GAS, ICA,H+H)
Acid-Base Excess: 1 mmol/L (ref 0.0–2.0)
Bicarbonate: 25.6 mmol/L (ref 20.0–28.0)
Calcium, Ion: 1.04 mmol/L — ABNORMAL LOW (ref 1.15–1.40)
HCT: 30 % — ABNORMAL LOW (ref 39.0–52.0)
Hemoglobin: 10.2 g/dL — ABNORMAL LOW (ref 13.0–17.0)
O2 Saturation: 99 %
Patient temperature: 99.3
Potassium: 5.4 mmol/L — ABNORMAL HIGH (ref 3.5–5.1)
Sodium: 131 mmol/L — ABNORMAL LOW (ref 135–145)
TCO2: 27 mmol/L (ref 22–32)
pCO2 arterial: 41.3 mmHg (ref 32.0–48.0)
pH, Arterial: 7.402 (ref 7.350–7.450)
pO2, Arterial: 128 mmHg — ABNORMAL HIGH (ref 83.0–108.0)

## 2019-08-12 LAB — GLUCOSE, CAPILLARY
Glucose-Capillary: 158 mg/dL — ABNORMAL HIGH (ref 70–99)
Glucose-Capillary: 188 mg/dL — ABNORMAL HIGH (ref 70–99)
Glucose-Capillary: 191 mg/dL — ABNORMAL HIGH (ref 70–99)
Glucose-Capillary: 233 mg/dL — ABNORMAL HIGH (ref 70–99)
Glucose-Capillary: 251 mg/dL — ABNORMAL HIGH (ref 70–99)
Glucose-Capillary: 306 mg/dL — ABNORMAL HIGH (ref 70–99)
Glucose-Capillary: 351 mg/dL — ABNORMAL HIGH (ref 70–99)

## 2019-08-12 LAB — HEMOGLOBIN A1C
Hgb A1c MFr Bld: 7.1 % — ABNORMAL HIGH (ref 4.8–5.6)
Mean Plasma Glucose: 157.07 mg/dL

## 2019-08-12 LAB — TRIGLYCERIDES: Triglycerides: 148 mg/dL (ref <150)

## 2019-08-12 LAB — MAGNESIUM: Magnesium: 2.6 mg/dL — ABNORMAL HIGH (ref 1.7–2.4)

## 2019-08-12 LAB — C-REACTIVE PROTEIN: CRP: 18.3 mg/dL — ABNORMAL HIGH (ref <1.0)

## 2019-08-12 LAB — HEPATITIS B SURFACE ANTIBODY,QUALITATIVE: Hep B S Ab: NONREACTIVE

## 2019-08-12 LAB — HEPATITIS B SURFACE ANTIGEN: Hepatitis B Surface Ag: NONREACTIVE

## 2019-08-12 LAB — D-DIMER, QUANTITATIVE: D-Dimer, Quant: 2.26 ug/mL-FEU — ABNORMAL HIGH (ref 0.00–0.50)

## 2019-08-12 LAB — PHOSPHORUS: Phosphorus: 8.9 mg/dL — ABNORMAL HIGH (ref 2.5–4.6)

## 2019-08-12 LAB — HEPATITIS B CORE ANTIBODY, TOTAL: Hep B Core Total Ab: REACTIVE — AB

## 2019-08-12 MED ORDER — INSULIN ASPART 100 UNIT/ML ~~LOC~~ SOLN
0.0000 [IU] | SUBCUTANEOUS | Status: DC
  Administered 2019-08-12: 2 [IU] via SUBCUTANEOUS
  Administered 2019-08-12: 9 [IU] via SUBCUTANEOUS
  Administered 2019-08-12: 5 [IU] via SUBCUTANEOUS

## 2019-08-12 MED ORDER — CALCIUM GLUCONATE-NACL 1-0.675 GM/50ML-% IV SOLN
1.0000 g | Freq: Once | INTRAVENOUS | Status: AC
  Administered 2019-08-12: 1000 mg via INTRAVENOUS
  Filled 2019-08-12: qty 50

## 2019-08-12 MED ORDER — SODIUM CHLORIDE 0.9 % IV SOLN: 250.0000 mL | INTRAVENOUS | Status: DC

## 2019-08-12 MED ORDER — NOREPINEPHRINE 4 MG/250ML-% IV SOLN: 2.0000 ug/min | INTRAVENOUS | Status: DC

## 2019-08-12 MED ORDER — FAMOTIDINE IN NACL 20-0.9 MG/50ML-% IV SOLN
20.0000 mg | INTRAVENOUS | Status: DC
  Administered 2019-08-12: 20 mg via INTRAVENOUS
  Filled 2019-08-12: qty 50

## 2019-08-12 MED ORDER — INSULIN ASPART 100 UNIT/ML ~~LOC~~ SOLN
0.0000 [IU] | SUBCUTANEOUS | Status: DC
  Administered 2019-08-12: 3 [IU] via SUBCUTANEOUS
  Administered 2019-08-12: 5 [IU] via SUBCUTANEOUS
  Administered 2019-08-12: 11 [IU] via SUBCUTANEOUS
  Administered 2019-08-13: 8 [IU] via SUBCUTANEOUS
  Administered 2019-08-13: 11 [IU] via SUBCUTANEOUS
  Administered 2019-08-13: 3 [IU] via SUBCUTANEOUS
  Administered 2019-08-13 (×2): 5 [IU] via SUBCUTANEOUS
  Administered 2019-08-13: 8 [IU] via SUBCUTANEOUS
  Administered 2019-08-14: 3 [IU] via SUBCUTANEOUS
  Administered 2019-08-14 (×2): 5 [IU] via SUBCUTANEOUS
  Administered 2019-08-14: 8 [IU] via SUBCUTANEOUS
  Administered 2019-08-14: 5 [IU] via SUBCUTANEOUS
  Administered 2019-08-15: 3 [IU] via SUBCUTANEOUS
  Administered 2019-08-15 (×2): 15 [IU] via SUBCUTANEOUS
  Administered 2019-08-15 (×2): 8 [IU] via SUBCUTANEOUS
  Administered 2019-08-15: 3 [IU] via SUBCUTANEOUS
  Administered 2019-08-16: 5 [IU] via SUBCUTANEOUS
  Administered 2019-08-16: 11 [IU] via SUBCUTANEOUS

## 2019-08-12 MED ORDER — HEPARIN SODIUM (PORCINE) 5000 UNIT/ML IJ SOLN
7500.0000 [IU] | Freq: Three times a day (TID) | INTRAMUSCULAR | Status: DC
  Administered 2019-08-12 – 2019-08-16 (×12): 7500 [IU] via SUBCUTANEOUS
  Filled 2019-08-12 (×12): qty 2

## 2019-08-12 MED ORDER — AMLODIPINE 1 MG/ML ORAL SUSPENSION
10.0000 mg | Freq: Every day | ORAL | Status: DC
  Filled 2019-08-12: qty 10

## 2019-08-12 MED ORDER — AMLODIPINE BESYLATE 10 MG PO TABS
10.0000 mg | ORAL_TABLET | Freq: Every day | ORAL | Status: DC
  Administered 2019-08-12 – 2019-08-14 (×3): 10 mg
  Filled 2019-08-12 (×4): qty 1

## 2019-08-12 NOTE — Progress Notes (Addendum)
NAMEKeandre Horton, MRN:  [id number], DOB:  Apr 18, 1961, LOS: 1 ADMISSION DATE:  08/09/2019, CONSULTATION DATE:  07/27/2019 REFERRING MD:  Dr Lorin Mercy, CHIEF COMPLAINT:  Covid pneumonia   Brief History   58 year old gentleman with a history of hypertension, end-stage renal disease on hemodialysis Presented with shortness of breath Fever of a days duration, seen at urgent care yesterday and had Covid testing performed, presented to the ED with worsening shortness of breath on the days duration Rapidly decompensated in the emergency department-subsequently intubated  History of present illness   Has been feeling unwell for about 3 days Room air saturations 88%, was doing well on 2 to 3 L of oxygen Admits hemodialysis yesterday secondary to feeling unwell  Past Medical History   Past Medical History:  Diagnosis Date  . Anemia   . Chronic kidney disease   . Diabetes mellitus without complication ([place])   . History of blood transfusion 05/2016  . Hypertension      Significant Hospital Events   Intubated 08/03/2019  Consults:  PCCM  Procedures:  None  Significant Diagnostic Tests:  Chest x-ray reviewed by myself showing multifocal infiltrates  Micro Data:  Blood cultures 11/21 2020 Coronavirus positive Antimicrobials:  Remdesivir 11/21>>  Interim history/subjective:  Intubated, sedated Spontaneously disconnected from vent briefly with associated desaturation. Improved oxygenation after reconnected to vent  Objective   Blood pressure (!) 147/95, pulse 97, temperature (!) 100.7 F (38.2 C), temperature source Oral, resp. rate (!) 28, height 5\' 11"  (1.803 m), weight 82.2 kg, SpO2 99 %.    Vent Mode: PRVC FiO2 (%):  [80 %-100 %] 80 % Set Rate:  [24 bmp-28 bmp] 28 bmp Vt Set:  [370 mL] 370 mL PEEP:  [8 cmH20] 8 cmH20 Plateau Pressure:  [20 cmH20-29 cmH20] 22 cmH20   Intake/Output Summary (Last 24 hours) at 08/12/2019 0953 Last data filed at 08/12/2019 9935 Gross per  24 hour  Intake 1885.79 ml  Output -  Net 1885.79 ml   Filed Weights   08/17/2019 1250 08/12/19 0311  Weight: 81.3 kg 82.2 kg    Examination: General: Adult male, WNWD. Critically ill appearing HENT: NCAT ETT secure Pink mm Trachea midline Lungs: MV sounds  Cardiovascular: s1s2 no rgm. Cap refill < 3 seconds  Abdomen: soft round ndnt  Extremities: No clubbing cyanosis or edema Neuro: Lightly sedate, moving spontaneously GU: defer, is dialysis patient  Resolved Hospital Problem list     Assessment & Plan:   COVID-19 positive test (U07.1, COVID-19) with Acute Pneumonia (J12.89, Other viral pneumonia) (If respiratory failure or sepsis present, add as separate assessment) -Remdesevir, setroids, increased dose SQ heparin -trend laboratory markers  Acute hypoxemic respiratory failure requiring intubation -in setting of COVID-19 infection  -Continue full MV support -AM CXR -Pulm hygiene  End-stage renal disease -On dialysis P -RRT per nephrology -lokelma  -trend renal indices  Hyperkalemia Hypocalcemia Hyponatremia P -replace calcium -lokelma for K, RRT per nephrology -no replacement for hyponatremia; continue to follow closely   Hypertension -home norvasc, clonidine, coreg P -will add qD norvasc -continue to monitor and add back as indicated by hemodynamics   Diabetes -persistent hyperglycemia (is also on methylpred now) P -increase SSI coverage  -Likely will need ongoing escalation in coverage 2/2 critical illness, steroids   Best practice:  Diet: Tube feeds Pain/Anxiety/Delirium protocol (if indicated): Propofol/fentanyl VAP protocol (if indicated): In place DVT prophylaxis: Heparin GI prophylaxis: Protonix Glucose control: increase SSI Mobility: Bedrest Code Status: Full code Family Communication: 11/22 wife  Disposition: ICU  Labs   CBC: Recent Labs  Lab 08/02/2019 0959 07/22/2019 1449 08/20/2019 2032 08/12/19 0242 08/12/19 0412  WBC 7.1  --    --   --  10.5  NEUTROABS 5.9  --   --   --  9.9*  HGB 10.9* 12.2* 12.6* 10.2* 10.5*  HCT 32.8* 36.0* 37.0* 30.0* 32.4*  MCV 99.1  --   --   --  100.3*  PLT 298  --   --   --  161    Basic Metabolic Panel: Recent Labs  Lab 07/28/2019 0959 08/05/2019 1449 08/14/2019 2032 08/12/19 0242 08/12/19 0412  NA 130* 122* 132* 131* 132*  K 7.1* >8.5* 5.2* 5.4* 6.1*  CL 89*  --   --   --  91*  CO2 25  --   --   --  22  GLUCOSE 52*  --   --   --  226*  BUN 95*  --   --   --  60*  CREATININE 18.72*  --   --   --  12.72*  CALCIUM 9.2  --   --   --  8.4*  MG  --   --   --   --  2.6*  PHOS  --   --   --   --  8.9*   GFR: Estimated Creatinine Clearance: 6.7 mL/min (A) (by C-G formula based on SCr of 12.72 mg/dL (H)). Recent Labs  Lab 07/22/2019 0959 08/14/2019 1000 08/15/2019 1157 08/12/19 0412  PROCALCITON 0.55  --   --   --   WBC 7.1  --   --  10.5  LATICACIDVEN  --  1.5 2.0*  --     Liver Function Tests: Recent Labs  Lab 08/03/2019 0959 08/12/19 0412  AST 19 24  ALT 12 16  ALKPHOS 56 48  BILITOT 0.9 0.7  PROT 7.4 6.9  ALBUMIN 3.2* 3.0*   No results for input(s): LIPASE, AMYLASE in the last 168 hours. No results for input(s): AMMONIA in the last 168 hours.  ABG    Component Value Date/Time   PHART 7.402 08/12/2019 0242   PCO2ART 41.3 08/12/2019 0242   PO2ART 128.0 (H) 08/12/2019 0242   HCO3 25.6 08/12/2019 0242   TCO2 27 08/12/2019 0242   ACIDBASEDEF 1.0 08/19/2019 1449   O2SAT 99.0 08/12/2019 0242     Coagulation Profile: No results for input(s): INR, PROTIME in the last 168 hours.  Cardiac Enzymes: No results for input(s): CKTOTAL, CKMB, CKMBINDEX, TROPONINI in the last 168 hours.  HbA1C: Hgb A1c MFr Bld  Date/Time Value Ref Range Status  08/12/2019 04:12 AM 7.1 (H) 4.8 - 5.6 % Final    Comment:    (NOTE) Pre diabetes:          5.7%-6.4% Diabetes:              >6.4% Glycemic control for   <7.0% adults with diabetes   06/10/2016 04:14 AM 5.6 4.8 - 5.6 % Final     Comment:    (NOTE)         Pre-diabetes: 5.7 - 6.4         Diabetes: >6.4         Glycemic control for adults with diabetes: <7.0     CBG: Recent Labs  Lab 08/17/2019 1637 08/12/2019 1947 08/19/2019 2357 08/12/19 0441 08/12/19 0822  GLUCAP 134* 89 188* 251* 351*    Review of Systems:   Unobtainable  Past Medical  History  He,  has a past medical history of Anemia, Chronic kidney disease, Diabetes mellitus without complication ([place]), History of blood transfusion (05/2016), and Hypertension.   Surgical History    Past Surgical History:  Procedure Laterality Date  . AV FISTULA PLACEMENT Left 06/08/2016   Procedure: ARTERIOVENOUS (AV) FISTULA CREATION;  Surgeon: Angelia Mould, MD;  Location: [place];  Service: Vascular;  Laterality: Left;  . INSERTION OF DIALYSIS CATHETER Right 06/08/2016   Procedure: INSERTION OF DIALYSIS CATHETER;  Surgeon: Angelia Mould, MD;  Location: [place];  Service: Vascular;  Laterality: Right;     Social History   reports that he has never smoked. He has never used smokeless tobacco. He reports that he does not drink alcohol or use drugs.   Family History   His family history includes Healthy in his father and mother. There is no history of Colon cancer, Esophageal cancer, Rectal cancer, or Stomach cancer.   Allergies No Known Allergies   CRITICAL CARE Performed by: Cristal Generous   Total critical care time: 35 minutes  Critical care time was exclusive of separately billable procedures and treating other patients. Critical care was necessary to treat or prevent imminent or life-threatening deterioration.  Critical care was time spent personally by me on the following activities: development of treatment plan with patient and/or surrogate as well as nursing, discussions with consultants, evaluation of patient's response to treatment, examination of patient, obtaining history from patient or surrogate, ordering and performing treatments and  interventions, ordering and review of laboratory studies, ordering and review of radiographic studies, pulse oximetry and re-evaluation of patient's condition.  Eliseo Gum MSN, AGACNP-BC [place] [phone] If no answer, [phone] 08/12/2019, 9:53 AM  Attending Note:  58 year old male with ESRD who missed dialysis and was noted to be COVID positive presenting in respiratory failure from pulmonary edema.  Overnight, had a cuff leak and had to replaced and bronched with frothy secretions diffusely.  On exam, coarse bilaterally.  I reviewed CXR myself, pulmonary edema persists and ETT is in a good position.  Discussed with PCCM-NP.  Will continue full vent support.  Decrease FiO2 to 70 and increase PEEP to 12.  Continue volume negative as able.  Sedation as ordered.  Not a candidate for GVC given iHD.  PCCM will continue to manage in [facility] for now.  The patient is critically ill with multiple organ systems failure and requires high complexity decision making for assessment and support, frequent evaluation and titration of therapies, application of advanced monitoring technologies and extensive interpretation of multiple databases.   Critical Care Time devoted to patient care services described in this note is  33  Minutes. This time reflects time of care of this signee Dr Jennet Maduro. This critical care time does not reflect procedure time, or teaching time or supervisory time of PA/NP/Med student/Med Resident etc but could involve care discussion time.  Rush Farmer, M.D. [facility] Pulmonary/Critical Care Medicine.

## 2019-08-12 NOTE — Progress Notes (Signed)
[facility] Progress Note   Assessment/ Plan:   DIALYSIS ORDERS:  [place] MWF 4 hrs EDW 81.5 F180, BFR 400 AVF, Autoflow 1.5 2K/ 2.25 Ca bath UF profile 4 Hectorol 7 mcg q rx Mircera 75 mcg q 4 weeks, last given 11/2 Binders are Velphoro 2 TID AC   1.  COVID +: hypoxic, intubated, getting remdesivir  2.  ESRD: MWF, missed Friday, got emergently dialyzed last night and then again today to accommodate for the upcoming holiday schedule   3.  Hyperkalemia: K > initially 8.5, no 6.1 this AM  4.  Anemia: Last ESA given 11/2, last OP Hgb 11.0  5.  Bone/ mineral: velphoro 2 TID AC as binder when getting TF, continue renavite as binder  6.  Nutrition: Last albumin 3.9  7.  Dispo: in ICU  Subjective:    HD yesterday, completed full treatment.  Intubated, lying in bed this AM   Objective:   BP (!) 147/95 (BP Location: Right Arm)   Pulse 97   Temp (!) 100.7 F (38.2 C) (Oral)   Resp (!) 28   Ht 5' 11"  (1.803 m)   Wt 82.2 kg   SpO2 99%   BMI 25.27 kg/m   Physical Exam: Intubated, sedating NAD Remainder of exam deferred to limit exposure to other providers and preserve PPE  Labs: BMET Recent Labs  Lab 08/15/2019 0959 08/06/2019 1449 08/09/2019 2032 08/12/19 0242 08/12/19 0412  NA 130* 122* 132* 131* 132*  K 7.1* >8.5* 5.2* 5.4* 6.1*  CL 89*  --   --   --  91*  CO2 25  --   --   --  22  GLUCOSE 52*  --   --   --  226*  BUN 95*  --   --   --  60*  CREATININE 18.72*  --   --   --  12.72*  CALCIUM 9.2  --   --   --  8.4*  PHOS  --   --   --   --  8.9*   CBC Recent Labs  Lab 07/29/2019 0959 08/20/2019 1449 08/19/2019 2032 08/12/19 0242 08/12/19 0412  WBC 7.1  --   --   --  10.5  NEUTROABS 5.9  --   --   --  9.9*  HGB 10.9* 12.2* 12.6* 10.2* 10.5*  HCT 32.8* 36.0* 37.0* 30.0* 32.4*  MCV 99.1  --   --   --  100.3*  PLT 298  --   --   --  216    @IMGRELPRIORS @ Medications:    . albuterol  2.5 mg Nebulization Q6H  . aspirin EC  81 mg Oral  Daily  . chlorhexidine gluconate (MEDLINE KIT)  15 mL Mouth Rinse BID  . Chlorhexidine Gluconate Cloth  6 each Topical Q0600  . dextrose  1 ampule Intravenous Once  . docusate  100 mg Per Tube BID  . doxercalciferol  7 mcg Intravenous Q T,Th,Sa-HD  . feeding supplement (PRO-STAT SUGAR FREE 64)  30 mL Per Tube BID  . feeding supplement (VITAL HIGH PROTEIN)  1,000 mL Per Tube Q24H  . heparin  5,000 Units Subcutaneous Q8H  . insulin aspart  0-9 Units Subcutaneous Q4H  . insulin aspart  5 Units Intravenous Once  . mouth rinse  15 mL Mouth Rinse 10 times per day  . methylPREDNISolone (SOLU-MEDROL) injection  0.5 mg/kg Intravenous Q12H  . multivitamin  15 mL Per Tube Daily  . sodium chloride flush  3 mL Intravenous Q12H  . sodium zirconium cyclosilicate  10 g Oral TID  . sucroferric oxyhydroxide  500 mg Oral TID WC     Madelon Lips, MD 08/12/2019, 9:09 AM

## 2019-08-12 NOTE — Progress Notes (Signed)
eLink Physician-Brief Progress Note Patient Name: Jonathan Horton DOB: 01/07/1961 MRN: [id number]   Date of Service  08/12/2019  HPI/Events of Note  Hyperglycemia - Blood glucose = 188.   eICU Interventions  Will order: 1. Q 4 hour sensitive Novolog SSI.      Intervention Category Major Interventions: Hyperglycemia - active titration of insulin therapy  Jazalyn Mondor Cornelia Copa 08/12/2019, 12:19 AM

## 2019-08-13 ENCOUNTER — Inpatient Hospital Stay (HOSPITAL_COMMUNITY): Payer: Medicare Other

## 2019-08-13 DIAGNOSIS — J80 Acute respiratory distress syndrome: Secondary | ICD-10-CM

## 2019-08-13 LAB — GLUCOSE, CAPILLARY
Glucose-Capillary: 266 mg/dL — ABNORMAL HIGH (ref 70–99)
Glucose-Capillary: 292 mg/dL — ABNORMAL HIGH (ref 70–99)
Glucose-Capillary: 313 mg/dL — ABNORMAL HIGH (ref 70–99)
Glucose-Capillary: 217 mg/dL — ABNORMAL HIGH (ref 70–99)
Glucose-Capillary: 222 mg/dL — ABNORMAL HIGH (ref 70–99)

## 2019-08-13 LAB — CBC
HCT: 33.3 % — ABNORMAL LOW (ref 39.0–52.0)
Hemoglobin: 11 g/dL — ABNORMAL LOW (ref 13.0–17.0)
MCH: 32.9 pg (ref 26.0–34.0)
MCHC: 33 g/dL (ref 30.0–36.0)
MCV: 99.7 fL (ref 80.0–100.0)
Platelets: 234 10*3/uL (ref 150–400)
RBC: 3.34 MIL/uL — ABNORMAL LOW (ref 4.22–5.81)
RDW: 12.9 % (ref 11.5–15.5)
WBC: 11.6 10*3/uL — ABNORMAL HIGH (ref 4.0–10.5)
nRBC: 0 % (ref 0.0–0.2)

## 2019-08-13 LAB — POCT I-STAT 7, (LYTES, BLD GAS, ICA,H+H)
Acid-Base Excess: 2 mmol/L (ref 0.0–2.0)
Bicarbonate: 25.9 mmol/L (ref 20.0–28.0)
Calcium, Ion: 1.02 mmol/L — ABNORMAL LOW (ref 1.15–1.40)
HCT: 28 % — ABNORMAL LOW (ref 39.0–52.0)
Hemoglobin: 9.5 g/dL — ABNORMAL LOW (ref 13.0–17.0)
O2 Saturation: 99 %
Patient temperature: 98
Potassium: 5.6 mmol/L — ABNORMAL HIGH (ref 3.5–5.1)
Sodium: 128 mmol/L — ABNORMAL LOW (ref 135–145)
TCO2: 27 mmol/L (ref 22–32)
pCO2 arterial: 38.2 mmHg (ref 32.0–48.0)
pH, Arterial: 7.437 (ref 7.350–7.450)
pO2, Arterial: 113 mmHg — ABNORMAL HIGH (ref 83.0–108.0)

## 2019-08-13 LAB — NOVEL CORONAVIRUS, NAA (HOSP ORDER, SEND-OUT TO REF LAB; TAT 18-24 HRS): SARS-CoV-2, NAA: DETECTED — AB

## 2019-08-13 MED ORDER — FAMOTIDINE 40 MG/5ML PO SUSR
20.0000 mg | Freq: Every day | ORAL | Status: DC
  Administered 2019-08-13 – 2019-08-15 (×3): 20 mg
  Filled 2019-08-13 (×3): qty 2.5

## 2019-08-13 MED ORDER — HYDROXYZINE HCL 25 MG PO TABS: 25.0000 mg | ORAL_TABLET | Freq: Three times a day (TID) | ORAL | Status: DC | PRN

## 2019-08-13 MED ORDER — CAMPHOR-MENTHOL 0.5-0.5 % EX LOTN
1.0000 "application " | TOPICAL_LOTION | Freq: Three times a day (TID) | CUTANEOUS | Status: DC | PRN
  Filled 2019-08-13: qty 222

## 2019-08-13 MED ORDER — ACETAMINOPHEN 325 MG PO TABS
650.0000 mg | ORAL_TABLET | Freq: Four times a day (QID) | ORAL | Status: DC | PRN
  Administered 2019-08-14: 650 mg
  Filled 2019-08-13: qty 2

## 2019-08-13 MED ORDER — CALCIUM CARBONATE ANTACID 1250 MG/5ML PO SUSP
500.0000 mg | Freq: Four times a day (QID) | ORAL | Status: DC | PRN
  Administered 2019-08-14: 500 mg
  Filled 2019-08-13: qty 5

## 2019-08-13 MED ORDER — VECURONIUM BROMIDE 10 MG IV SOLR
0.1000 mg/kg | Freq: Once | INTRAVENOUS | Status: AC
  Administered 2019-08-13: 8.1 mg via INTRAVENOUS

## 2019-08-13 MED ORDER — INSULIN GLARGINE 100 UNIT/ML ~~LOC~~ SOLN
15.0000 [IU] | Freq: Every day | SUBCUTANEOUS | Status: DC
  Administered 2019-08-13: 15 [IU] via SUBCUTANEOUS
  Filled 2019-08-13 (×2): qty 0.15

## 2019-08-13 MED ORDER — GUAIFENESIN-DM 100-10 MG/5ML PO SYRP
10.0000 mL | ORAL_SOLUTION | ORAL | Status: DC | PRN
  Filled 2019-08-13: qty 10

## 2019-08-13 MED ORDER — CHLORHEXIDINE GLUCONATE CLOTH 2 % EX PADS: 6.0000 | MEDICATED_PAD | Freq: Every day | CUTANEOUS | Status: DC

## 2019-08-13 MED ORDER — NEPRO/CARBSTEADY PO LIQD: 237.0000 mL | Freq: Three times a day (TID) | ORAL | Status: DC | PRN

## 2019-08-13 MED ORDER — VITAL AF 1.2 CAL PO LIQD
1000.0000 mL | ORAL | Status: DC
  Administered 2019-08-13 – 2019-08-16 (×5): 1000 mL

## 2019-08-13 MED ORDER — VECURONIUM BROMIDE 10 MG IV SOLR
INTRAVENOUS | Status: AC
  Filled 2019-08-13: qty 10

## 2019-08-13 MED ORDER — FENTANYL CITRATE (PF) 100 MCG/2ML IJ SOLN
INTRAMUSCULAR | Status: AC
  Filled 2019-08-13: qty 2

## 2019-08-13 MED ORDER — DOXERCALCIFEROL 4 MCG/2ML IV SOLN
INTRAVENOUS | Status: AC
  Filled 2019-08-13: qty 4

## 2019-08-13 MED ORDER — VECURONIUM BROMIDE 10 MG IV SOLR
0.1000 mg/kg | Freq: Once | INTRAVENOUS | Status: AC
  Administered 2019-08-13: 8.1 mg via INTRAVENOUS
  Filled 2019-08-13: qty 10

## 2019-08-13 MED ORDER — ALBUTEROL SULFATE (2.5 MG/3ML) 0.083% IN NEBU: 2.5000 mg | INHALATION_SOLUTION | RESPIRATORY_TRACT | Status: DC | PRN

## 2019-08-13 NOTE — Progress Notes (Signed)
NAMECheryl Horton, MRN:  [id number], DOB:  1961-08-23, LOS: 2 ADMISSION DATE:  08/08/2019, CONSULTATION DATE:  08/15/2019 REFERRING MD:  Dr Lorin Mercy, CHIEF COMPLAINT:  Covid pneumonia   Brief History   58 yo male presented with dyspnea, fever.  Had COVID 19 testing at urgent care center 1 day prior to admission and positive.  Symptoms rapidly progressed, presented to ER and required intubation for COVID 19 pneumonia.  Past Medical History  ESRD, DM, HTN  Significant Hospital Events   11/21 Admit  Consults:  Nephrology  Procedures:  ETT 11/21 >>   Significant Diagnostic Tests:    Micro Data:  SARS CoV2 11/21 >> detected Blood 11/21 >>  Antimicrobials:  Steroids 11/21 >> Remdesivir 11/21 >>   Interim history/subjective:  Remains on vent, sedation.  Objective   Blood pressure 121/82, pulse 86, temperature 99.6 F (37.6 C), temperature source Oral, resp. rate (!) 28, height 5\' 11"  (1.803 m), weight 81.1 kg, SpO2 100 %.    Vent Mode: PRVC FiO2 (%):  [60 %-70 %] 60 % Set Rate:  [28 bmp] 28 bmp Vt Set:  [370 mL] 370 mL PEEP:  [8 cmH20-12 cmH20] 8 cmH20 Plateau Pressure:  [20 cmH20-24 cmH20] 20 cmH20   Intake/Output Summary (Last 24 hours) at 08/13/2019 0923 Last data filed at 08/13/2019 0820 Gross per 24 hour  Intake 2152.77 ml  Output 2503 ml  Net -350.23 ml   Filed Weights   08/12/19 1730 08/12/19 2059 08/13/19 0312  Weight: 83.9 kg 81.4 kg 81.1 kg    Examination:  General - sedated Eyes - pupils reactive ENT - ETT in place Cardiac - regular rate/rhythm, no murmur Chest - b/l rhonchi Abdomen - soft, non tender, + bowel sounds Extremities - no cyanosis, clubbing, or edema Skin - no rashes Neuro - RASS -3  CXR (reviewed by me) - b/l ASD Rt > [facility] Problem list     Assessment & Plan:   Acute hypoxic respiratory failure with ARDS from COVID 19 pneumonia. - goal SpO2 88 to 95% - f/u CXR - day 3 of steroids, remdesivir  ESRD.  Hyperkalemia. - HD per renal  Hx of HTN. 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 - SSI  Anemia of critical illness and chronic disease. - f/u CBC - transfuse for Hb < 7 or significant bleeding  Acute metabolic encephalopathy. - RASS goal -1 to -2   Best practice:  Diet: Tube feeds DVT prophylaxis: SQ heparin GI prophylaxis: Pepcid Mobility: Bedrest Code Status: Full code Disposition: ICU  Labs    CMP Latest Ref Rng & Units 08/12/2019 08/12/2019 08/12/2019  Glucose 70 - 99 mg/dL 230(H) 226(H) -  BUN 6 - 20 mg/dL 82(H) 60(H) -  Creatinine 0.61 - 1.24 mg/dL 12.67(H) 12.72(H) -  Sodium 135 - 145 mmol/L 131(L) 132(L) 131(L)  Potassium 3.5 - 5.1 mmol/L 5.5(H) 6.1(H) 5.4(H)  Chloride 98 - 111 mmol/L 92(L) 91(L) -  CO2 22 - 32 mmol/L 24 22 -  Calcium 8.9 - 10.3 mg/dL 8.0(L) 8.4(L) -  Total Protein 6.5 - 8.1 g/dL - 6.9 -  Total Bilirubin 0.3 - 1.2 mg/dL - 0.7 -  Alkaline Phos 38 - 126 U/L - 48 -  AST 15 - 41 U/L - 24 -  ALT 0 - 44 U/L - 16 -    CBC Latest Ref Rng & Units 08/12/2019 08/12/2019 08/03/2019  WBC 4.0 -  10.5 K/uL 10.5 - -  Hemoglobin 13.0 - 17.0 g/dL 10.5(L) 10.2(L) 12.6(L)  Hematocrit 39.0 - 52.0 % 32.4(L) 30.0(L) 37.0(L)  Platelets 150 - 400 K/uL 216 - -    ABG    Component Value Date/Time   PHART 7.402 08/12/2019 0242   PCO2ART 41.3 08/12/2019 0242   PO2ART 128.0 (H) 08/12/2019 0242   HCO3 25.6 08/12/2019 0242   TCO2 27 08/12/2019 0242   ACIDBASEDEF 1.0 07/22/2019 1449   O2SAT 99.0 08/12/2019 0242    CBG (last 3)  Recent Labs    08/12/19 2354 08/13/19 0347 08/13/19 0800  GLUCAP 191* 266* 292*    CC time 36 minutes  Chesley Mires, MD [place] 08/13/2019, 9:18 AM

## 2019-08-13 NOTE — Progress Notes (Signed)
Inpatient Diabetes Program Recommendations  AACE/ADA: New Consensus Statement on Inpatient Glycemic Control (2015)  Target Ranges:  Prepandial:   less than 140 mg/dL      Peak postprandial:   less than 180 mg/dL (1-2 hours)      Critically ill patients:  140 - 180 mg/dL   Results for SOHUM, DELILLO (MRN [id number]) as of 08/13/2019 09:01  Ref. Range 07/31/2019 23:57 08/12/2019 04:41 08/12/2019 08:22 08/12/2019 12:12 08/12/2019 16:09 08/12/2019 19:41  Glucose-Capillary Latest Ref Range: 70 - 99 mg/dL 188 (H)  2 units NOVOLOG  251 (H)  5 units NOVOLOG  351 (H)  9 units NOVOLOG  306 (H)  11 units NOVOLOG  233 (H)  5 units NOVOLOG  158 (H)  3 units NOVOLOG    Results for AKAASH, VANDEWATER (MRN [id number]) as of 08/13/2019 09:01  Ref. Range 08/12/2019 23:54 08/13/2019 03:47 08/13/2019 08:00  Glucose-Capillary Latest Ref Range: 70 - 99 mg/dL 191 (H)  3 units NOVOLOG  266 (H)  8 units NOVOLOG  292 (H)  8 units NOVOLOG    Results for CHRISTORPHER, HISAW (MRN [id number]) as of 08/13/2019 09:01  Ref. Range 08/12/2019 04:12  Hemoglobin A1C Latest Ref Range: 4.8 - 5.6 % 7.1 (H)  (157 mg/dl)     Admit with: COVID +/ Volume Overload/ Hyperkalemic  History: DM, ESRD  Home DM Meds: None listed  Current Orders: Novolog Moderate Correction Scale/ SSI (0-15 units) Q4 hours      Getting Solumedrol 0.5 mg/kg BID  Getting Tube Feeds vital high protein 40cc/hr.     MD- Please consider the following in-hospital insulin adjustments:  1. Start Levemir 12 units Daily (0.15 units/kg dosing)  2. Start Novolog Tube Feed Coverage: Novolog 3 units Q4 hours  HOLD if tube feeds HELD for any reason     --Will follow patient during hospitalization--  Wyn Quaker RN, MSN, CDE Diabetes Coordinator Inpatient Glycemic Control Team Team Pager: [phone] (8a-5p)

## 2019-08-13 NOTE — Progress Notes (Signed)
[facility] Progress Note  Subjective: on vent , sedated  Vitals:   08/13/19 1000 08/13/19 1100 08/13/19 1200 08/13/19 1311  BP: (!) 123/59 122/73 128/74   Pulse:  81 82 86  Resp: (!) 28 (!) 26 (!) 28 (!) 35  Temp:   99 F (37.2 C)   TempSrc:   Oral   SpO2:  100% 100% 100%  Weight:      Height:        Inpatient medications: . amLODipine  10 mg Per Tube Daily  . aspirin EC  81 mg Oral Daily  . chlorhexidine gluconate (MEDLINE KIT)  15 mL Mouth Rinse BID  . Chlorhexidine Gluconate Cloth  6 each Topical Q0600  . docusate  100 mg Per Tube BID  . doxercalciferol      . doxercalciferol  7 mcg Intravenous Q T,Th,Sa-HD  . famotidine  20 mg Per Tube QHS  . heparin  7,500 Units Subcutaneous Q8H  . insulin aspart  0-15 Units Subcutaneous Q4H  . insulin glargine  15 Units Subcutaneous Daily  . mouth rinse  15 mL Mouth Rinse 10 times per day  . methylPREDNISolone (SOLU-MEDROL) injection  0.5 mg/kg Intravenous Q12H  . multivitamin  15 mL Per Tube Daily  . sodium chloride flush  3 mL Intravenous Q12H  . sucroferric oxyhydroxide  500 mg Oral TID WC   . sodium chloride    . sodium chloride Stopped (08/13/19 0500)  . feeding supplement (VITAL AF 1.2 CAL) 1,000 mL (08/13/19 1347)  . fentaNYL infusion INTRAVENOUS 50 mcg/hr (08/13/19 1200)  . propofol (DIPRIVAN) infusion 22 mcg/kg/min (08/13/19 1025)  . remdesivir 100 mg in NS 250 mL 100 mg (08/13/19 1302)   sodium chloride, acetaminophen, albuterol, alteplase, calcium carbonate (dosed in mg elemental calcium), camphor-menthol **AND** hydrOXYzine, docusate sodium, feeding supplement (NEPRO CARB STEADY), fentaNYL, guaiFENesin-dextromethorphan, heparin, heparin, lidocaine (PF), lidocaine-prilocaine, [DISCONTINUED] ondansetron **OR** ondansetron (ZOFRAN) IV, pentafluoroprop-tetrafluoroeth, sodium chloride flush    Exam:  on vent, sedated, not responding  no jvd, flat neck veins   Chest clear ant/ lat , not able to hear well  w/ fans   Cor reg no RG   Abd firm, mild distended, dec'd BS    Ext no LE or UE edema    LUA AVF +bruit       Dialysis: [place] MWF   4h  81.5kg  400/ 1.5   P4  Heparin none  Hectorol 7 mcg q rx Mircera 75 mcg q 4 weeks, last given 11/2 Binders are Velphoro 2 TID [facility]  Assessment/ Plan: 1.  COVID +: hypoxic, intubated, getting remdesivir. CXR looks better after UF w/ HD x 2 this wknd.    2.  ESRD: MWF, missed HD Friday, had emergent HD Sat and HD again yesterday. Next HD tomorrow.   3.  Hyperkalemia: resolved  4.  Anemia: Last ESA given 11/2, last OP Hgb 11.0  5.  Bone/ mineral: velphoro 2 TID AC as binder when getting TF, continue renavite as binder  6.  Nutrition: Last albumin 3.9  7.  Dispo: in ICU      Rob Hunker 08/13/2019, 2:09 PM  Iron/TIBC/Ferritin/ %Sat    Component Value Date/Time   IRON 72 06/08/2016 1707   TIBC 224 (L) 06/08/2016 1707   FERRITIN 1,993 (H) 08/12/2019 0412   IRONPCTSAT 32 06/08/2016 1707   Recent Labs  Lab 08/12/19 0412 08/12/19 1633 08/13/19 0350  NA 132* 131* 128*  K 6.1* 5.5* 5.6*  CL 91* 92*  --  CO2 22 24  --   GLUCOSE 226* 230*  --   BUN 60* 82*  --   CREATININE 12.72* 12.67*  --   CALCIUM 8.4* 8.0*  --   PHOS 8.9*  --   --   ALBUMIN 3.0*  --   --    Recent Labs  Lab 08/12/19 0412  AST 24  ALT 16  ALKPHOS 48  BILITOT 0.7  PROT 6.9   Recent Labs  Lab 08/13/19 1013  WBC 11.6*  HGB 11.0*  HCT 33.3*  PLT 234

## 2019-08-13 NOTE — Progress Notes (Signed)
Patient's home OP HD clinic is [facility]. He has already been transferred to OP HD isolation shift at [facility] for HD treatment at discharge from hospital. Renal Navigator following in order to discuss OP HD treatment and ensure transportation to clinic with patient when he is more stable and close to discharge.   Alphonzo Cruise, [place] Renal Navigator [phone]

## 2019-08-13 NOTE — H&P (Signed)
See consult note from 07/28/2019 by Dr. Lorin Mercy.

## 2019-08-13 NOTE — Progress Notes (Signed)
Initial Nutrition Assessment  DOCUMENTATION CODES:   Not applicable  INTERVENTION:    Vital AF 1.2 at 70 ml/h (1680 ml per day)   Provides 2016 kcal, 126 gm protein, 1362 ml free water daily  NUTRITION DIAGNOSIS:   Increased nutrient needs related to acute illness(COVID PNA) as evidenced by estimated needs.  GOAL:   Patient will meet greater than or equal to 90% of their needs  MONITOR:   Vent status, TF tolerance, Skin, Labs  REASON FOR ASSESSMENT:   Ventilator, Consult Enteral/tube feeding initiation and management  ASSESSMENT:   58 yo male admitted with dyspnea, fever, COVID test positive 11/21. Required intubation for ARDS r/t COVID PNA on admission. PMH includes ESRD, DM, HTN.   Received MD Consult for TF initiation and management. OG tube in place. Discussed patient in ICU rounds and with RN today. Patient is anuric. Tolerating TF well: Vital High Protein at 40 ml/h with Pro-stat 30 ml BID.   Patient is currently intubated on ventilator support, Levophed is currently off.  MV: 10.3 L/min Temp (24hrs), Avg:98.9 F (37.2 C), Min:98.2 F (36.8 C), Max:99.6 F (37.6 C)  Propofol: off   Labs reviewed. Sodium 128 (L), potassium 5.6 (H), ionized calcium 1.02 (L), BUN 82 (H), creatinine 12.67 (H) CBG's: 266-292  Medications reviewed and include colace, novolog, solu-medrol, MVI, velphoro.  Weight 81.3 kg on admission, currently 81.1 kg  I/O + 1.6 L   NUTRITION - FOCUSED PHYSICAL EXAM:  deferred  Diet Order:   Diet Order    None      EDUCATION NEEDS:   Not appropriate for education at this time  Skin:  Skin Assessment: Reviewed RN Assessment  Last BM:  no BM documented  Height:   Ht Readings from Last 1 Encounters:  08/10/2019 5\' 11"  (1.803 m)    Weight:   Wt Readings from Last 1 Encounters:  08/13/19 81.1 kg    Ideal Body Weight:  78.2 kg  BMI:  Body mass index is 24.94 kg/m.  Estimated Nutritional Needs:   Kcal:   1995  Protein:  120-135 gm  Fluid:  1 L + UOP    Molli Barrows, RD, LDN, [place] Pager [phone] After Hours Pager [phone]

## 2019-08-13 NOTE — Progress Notes (Signed)
Attempted to call family at listed number.  No answer.  Chesley Mires, MD [facility] Pulmonary/Critical Care 08/13/2019, 1:48 PM

## 2019-08-13 NOTE — Progress Notes (Signed)
eLink Physician-Brief Progress Note Patient Name: Jonathan Horton DOB: 05-11-1961 MRN: [id number]   Date of Service  08/13/2019  HPI/Events of Note  After cleaning him, patient developed vent dys-synchrony. Seen on camera. RN had already increased fentanyl and propofol, he is well sedated.   eICU Interventions  Will try vecuronium x 1 (was also given a dose earlier this evening, so repeating same dose)      Intervention Category Major Interventions: Respiratory failure - evaluation and management  Margaretmary Lombard 08/13/2019, 10:32 PM

## 2019-08-14 DIAGNOSIS — Z978 Presence of other specified devices: Secondary | ICD-10-CM

## 2019-08-14 LAB — COMPREHENSIVE METABOLIC PANEL
ALT: 23 U/L (ref 0–44)
AST: 25 U/L (ref 15–41)
Albumin: 2.3 g/dL — ABNORMAL LOW (ref 3.5–5.0)
Alkaline Phosphatase: 46 U/L (ref 38–126)
Anion gap: 23 — ABNORMAL HIGH (ref 5–15)
BUN: 120 mg/dL — ABNORMAL HIGH (ref 6–20)
CO2: 21 mmol/L — ABNORMAL LOW (ref 22–32)
Calcium: 7.9 mg/dL — ABNORMAL LOW (ref 8.9–10.3)
Chloride: 87 mmol/L — ABNORMAL LOW (ref 98–111)
Creatinine, Ser: 14.16 mg/dL — ABNORMAL HIGH (ref 0.61–1.24)
GFR calc Af Amer: 4 mL/min — ABNORMAL LOW (ref >60)
GFR calc non Af Amer: 3 mL/min — ABNORMAL LOW (ref >60)
Glucose, Bld: 289 mg/dL — ABNORMAL HIGH (ref 70–99)
Potassium: 6.9 mmol/L (ref 3.5–5.1)
Sodium: 131 mmol/L — ABNORMAL LOW (ref 135–145)
Total Bilirubin: 1 mg/dL (ref 0.3–1.2)
Total Protein: 6.1 g/dL — ABNORMAL LOW (ref 6.5–8.1)

## 2019-08-14 LAB — GLUCOSE, CAPILLARY
Glucose-Capillary: 157 mg/dL — ABNORMAL HIGH (ref 70–99)
Glucose-Capillary: 222 mg/dL — ABNORMAL HIGH (ref 70–99)
Glucose-Capillary: 240 mg/dL — ABNORMAL HIGH (ref 70–99)
Glucose-Capillary: 250 mg/dL — ABNORMAL HIGH (ref 70–99)
Glucose-Capillary: 281 mg/dL — ABNORMAL HIGH (ref 70–99)
Glucose-Capillary: 96 mg/dL (ref 70–99)

## 2019-08-14 LAB — C-REACTIVE PROTEIN: CRP: 21.3 mg/dL — ABNORMAL HIGH (ref <1.0)

## 2019-08-14 LAB — FERRITIN: Ferritin: 2575 ng/mL — ABNORMAL HIGH (ref 24–336)

## 2019-08-14 LAB — PHOSPHORUS: Phosphorus: 11.4 mg/dL — ABNORMAL HIGH (ref 2.5–4.6)

## 2019-08-14 LAB — TRIGLYCERIDES: Triglycerides: 212 mg/dL — ABNORMAL HIGH (ref <150)

## 2019-08-14 LAB — D-DIMER, QUANTITATIVE: D-Dimer, Quant: 1.22 ug/mL-FEU — ABNORMAL HIGH (ref 0.00–0.50)

## 2019-08-14 LAB — MAGNESIUM: Magnesium: 2.7 mg/dL — ABNORMAL HIGH (ref 1.7–2.4)

## 2019-08-14 MED ORDER — CALCIUM GLUCONATE-NACL 1-0.675 GM/50ML-% IV SOLN
1.0000 g | Freq: Once | INTRAVENOUS | Status: AC
  Administered 2019-08-14: 1000 mg via INTRAVENOUS
  Filled 2019-08-14: qty 50

## 2019-08-14 MED ORDER — ACETAMINOPHEN 160 MG/5ML PO SOLN
650.0000 mg | Freq: Four times a day (QID) | ORAL | Status: DC | PRN
  Filled 2019-08-14: qty 20.3

## 2019-08-14 MED ORDER — INSULIN ASPART 100 UNIT/ML IV SOLN
10.0000 [IU] | Freq: Once | INTRAVENOUS | Status: AC
  Administered 2019-08-14: 10 [IU] via INTRAVENOUS

## 2019-08-14 MED ORDER — DEXTROSE 50 % IV SOLN
1.0000 | Freq: Once | INTRAVENOUS | Status: AC
  Administered 2019-08-14: 50 mL via INTRAVENOUS
  Filled 2019-08-14: qty 50

## 2019-08-14 MED ORDER — INSULIN ASPART 100 UNIT/ML ~~LOC~~ SOLN
6.0000 [IU] | SUBCUTANEOUS | Status: DC
  Administered 2019-08-14: 6 [IU] via SUBCUTANEOUS

## 2019-08-14 MED ORDER — INSULIN ASPART 100 UNIT/ML ~~LOC~~ SOLN
6.0000 [IU] | SUBCUTANEOUS | Status: DC
  Administered 2019-08-14 (×2): 6 [IU] via INTRAVENOUS

## 2019-08-14 MED ORDER — TOCILIZUMAB 400 MG/20ML IV SOLN
4.0000 mg/kg | Freq: Once | INTRAVENOUS | Status: AC
  Administered 2019-08-14: 334 mg via INTRAVENOUS
  Filled 2019-08-14: qty 16.7

## 2019-08-14 MED ORDER — SODIUM BICARBONATE 8.4 % IV SOLN
50.0000 meq | Freq: Once | INTRAVENOUS | Status: AC
  Administered 2019-08-14: 50 meq via INTRAVENOUS
  Filled 2019-08-14 (×2): qty 50

## 2019-08-14 MED ORDER — INSULIN GLARGINE 100 UNIT/ML ~~LOC~~ SOLN
20.0000 [IU] | Freq: Every day | SUBCUTANEOUS | Status: DC
  Administered 2019-08-14: 20 [IU] via SUBCUTANEOUS
  Filled 2019-08-14 (×3): qty 0.2

## 2019-08-14 MED ORDER — SENNOSIDES 8.8 MG/5ML PO SYRP
10.0000 mL | ORAL_SOLUTION | Freq: Every day | ORAL | Status: DC
  Administered 2019-08-14 – 2019-08-16 (×3): 10 mL
  Filled 2019-08-14 (×3): qty 10

## 2019-08-14 NOTE — Progress Notes (Signed)
PCCM Interval Progress Note   Discussed use of Actemra with family given rise in inflammatory markers (noteably CRP 10.3 > 18.3 > 21.3, Ferritin 1544 > 1993 > 2575).  The treatment plan and use of Actemra and known side effects were discussed with patient/family, they were clearly explained that there is no proven definitive treatment for COVID-19 infection, any medications used here are based on published clinical articles/anecdotal data which are not peer-reviewed or randomized control trials.  Complete risks and long-term side effects are unknown, however in the best clinical judgment they seem to be of some clinical benefit rather than medical risks.  Patient/family are accepting and agree with the treatment plan to trial Actemra.  If no improvement, can consider convalescent plasma 11/25.   Montey Hora, [facility] 08/14/2019, 10:55 AM

## 2019-08-14 NOTE — Progress Notes (Addendum)
Inpatient Diabetes Program Recommendations  AACE/ADA: New Consensus Statement on Inpatient Glycemic Control (2015)  Target Ranges:  Prepandial:   less than 140 mg/dL      Peak postprandial:   less than 180 mg/dL (1-2 hours)      Critically ill patients:  140 - 180 mg/dL   Results for Jonathan Horton, Jonathan Horton (MRN [id number]) as of 08/14/2019 07:39  Ref. Range 08/12/2019 23:54 08/13/2019 03:47 08/13/2019 08:00 08/13/2019 11:16 08/13/2019 15:53 08/13/2019 19:35  Glucose-Capillary Latest Ref Range: 70 - 99 mg/dL 191 (H)  3 units NOVOLOG  266 (H)  8 units NOVOLOG  292 (H)  8 units NOVOLOG  313 (H)  11 units NOVOLOG +  15 units LANTUS given at 12:37pm  217 (H)  5 units NOVOLOG  222 (H)  5  units NOVOLOG    Results for [name], [name] (MRN [id number]) as of 08/14/2019 07:39  Ref. Range 08/14/2019 00:27 08/14/2019 03:57 08/14/2019 07:35  Glucose-Capillary Latest Ref Range: 70 - 99 mg/dL 240 (H)  5 units NOVOLOG  250 (H)  5 units NOVOLOG  281 (H)     Admit with: COVID +/ Volume Overload/ Hyperkalemic  History: DM, ESRD  Home DM Meds: None listed  Current Orders: Novolog Moderate Correction Scale/ SSI (0-15 units) Q4 hours     Lantus 15 units Daily      Getting Solumedrol 0.5 mg/kg BID  Getting Tube Feeds vital high protein 70cc/hr.     MD- Please consider the following in-hospital insulin adjustments:  1. Increase Lantus to 20 units Daily  2. Start Novolog Tube Feed Coverage: Novolog 6 units Q4 hours  HOLD if tube feeds HELD for any reason     --Will follow patient during hospitalization--  Wyn Quaker RN, MSN, CDE Diabetes Coordinator Inpatient Glycemic Control Team Team Pager: [phone] (8a-5p)

## 2019-08-14 NOTE — Progress Notes (Signed)
Patient had episode of desaturation sustained into low 70s with good waveform. Synchronous with vent. Gave pt O2 breaths and attempted to change SpO2 probe. Suctioned ETT once with no secretions. Before changing probes, O2 sat rebounded back to 100%. No other issues at this time. No acute distress displayed by patient. Will continue to monitor.

## 2019-08-14 NOTE — Progress Notes (Signed)
[facility] Progress Note  Subjective:  Patient not examined today directly given COVID-19 + status, utilizing exam of the primary team and observations of RN's.  I/O +2.3 L yest, wt's up 2 kg today.     Vitals:   08/14/19 0830 08/14/19 0900 08/14/19 0930 08/14/19 1000  BP: (!) 104/55 (!) 103/52 (!) 92/48 (!) 96/51  Pulse: 82 81 81 81  Resp: (!) 28 (!) 28 (!) 28 (!) 28  Temp:      TempSrc:      SpO2: 100% 100% 100% 100%  Weight:      Height:        Inpatient medications: . amLODipine  10 mg Per Tube Daily  . aspirin EC  81 mg Oral Daily  . chlorhexidine gluconate (MEDLINE KIT)  15 mL Mouth Rinse BID  . Chlorhexidine Gluconate Cloth  6 each Topical Q0600  . dextrose  1 ampule Intravenous Once  . docusate  100 mg Per Tube BID  . doxercalciferol  7 mcg Intravenous Q T,Th,Sa-HD  . famotidine  20 mg Per Tube QHS  . heparin  7,500 Units Subcutaneous Q8H  . insulin aspart  0-15 Units Subcutaneous Q4H  . insulin aspart  10 Units Intravenous Once  . insulin aspart  6 Units Intravenous Q4H  . insulin glargine  20 Units Subcutaneous Daily  . mouth rinse  15 mL Mouth Rinse 10 times per day  . methylPREDNISolone (SOLU-MEDROL) injection  0.5 mg/kg Intravenous Q12H  . multivitamin  15 mL Per Tube Daily  . sennosides  10 mL Per Tube Daily  . sodium chloride flush  3 mL Intravenous Q12H  . sucroferric oxyhydroxide  500 mg Oral TID WC   . sodium chloride    . sodium chloride Stopped (08/13/19 0500)  . calcium gluconate    . feeding supplement (VITAL AF 1.2 CAL) 1,000 mL (08/14/19 0409)  . fentaNYL infusion INTRAVENOUS 100 mcg/hr (08/14/19 1000)  . propofol (DIPRIVAN) infusion 35 mcg/kg/min (08/14/19 0800)  . remdesivir 100 mg in NS 250 mL Stopped (08/13/19 1335)  . tocilizumab (ACTEMRA) IV     sodium chloride, acetaminophen, albuterol, alteplase, calcium carbonate (dosed in mg elemental calcium), camphor-menthol **AND** hydrOXYzine, docusate sodium, feeding supplement  (NEPRO CARB STEADY), fentaNYL, guaiFENesin-dextromethorphan, heparin, heparin, lidocaine (PF), lidocaine-prilocaine, [DISCONTINUED] ondansetron **OR** ondansetron (ZOFRAN) IV, pentafluoroprop-tetrafluoroeth, sodium chloride flush    Exam:  Patient not examined today directly given COVID-19 + status, utilizing exam of the primary team and observations of RN's.        Dialysis: [place] MWF   4h  81.5kg  400/ 1.5   P4  Heparin none  Hectorol 7 mcg q rx Mircera 75 mcg q 4 weeks, last given 11/2 Binders are Velphoro 2 TID [facility]  Assessment/ Plan: 1.  COVID +: hypoxic, intubated, getting remdesivir. CXR looked better after UF w/ HD x 2 this wknd.  On for HD today.   2.  ESRD: MWF HD. Had emergent HD Sat and HD again Sunday. Up 2kg today. Plan HD today then on Friday.   3.  Hyperkalemia: recurrent, start Lokelma tid and use low K bath  4.  Anemia: Last ESA given 11/2, last OP Hgb 11.0  5.  Bone/ mineral: velphoro 2 TID AC as binder when getting TF, continue renavite as binder  6.  Nutrition: Last albumin 3.9    Jonathan Horton 08/14/2019, 11:12 AM  Iron/TIBC/Ferritin/ %Sat    Component Value Date/Time   IRON 72 06/08/2016 1707   TIBC  224 (L) 06/08/2016 1707   FERRITIN 2,575 (H) 08/14/2019 0548   IRONPCTSAT 32 06/08/2016 1707   Recent Labs  Lab 08/14/19 0548  NA 131*  K 6.9*  CL 87*  CO2 21*  GLUCOSE 289*  BUN 120*  CREATININE 14.16*  CALCIUM 7.9*  PHOS 11.4*  ALBUMIN 2.3*   Recent Labs  Lab 08/14/19 0548  AST 25  ALT 23  ALKPHOS 46  BILITOT 1.0  PROT 6.1*   Recent Labs  Lab 08/13/19 1013  WBC 11.6*  HGB 11.0*  HCT 33.3*  PLT 234

## 2019-08-14 NOTE — Progress Notes (Signed)
   NAMEZain Horton, MRN:  [id number], DOB:  1961/01/20, LOS: 3 ADMISSION DATE:  08/02/2019, CONSULTATION DATE:  07/26/2019 REFERRING MD:  Dr Lorin Mercy, CHIEF COMPLAINT:  Covid pneumonia   Brief History   58 yo male presented with dyspnea, fever.  Had COVID 19 testing at urgent care center 1 day prior to admission and positive.  Symptoms rapidly progressed, presented to ER and required intubation for COVID 19 pneumonia.  Past Medical History  ESRD, DM, HTN  Significant Hospital Events   11/21 Admit  Consults:  Nephrology  Procedures:  ETT 11/21 >>   Significant Diagnostic Tests:    Micro Data:  SARS CoV2 11/21 >> detected Blood 11/21 >>  Antimicrobials:  Steroids 11/21 >> Remdesivir 11/21 >>   Interim history/subjective:  Remains on vent, sedation. No acute events  Objective   Blood pressure (!) 110/55, pulse 82, temperature 99.8 F (37.7 C), temperature source Oral, resp. rate (!) 28, height 5\' 11"  (1.803 m), weight 83.7 kg, SpO2 100 %.    Vent Mode: PRVC FiO2 (%):  [50 %-60 %] 50 % Set Rate:  [28 bmp] 28 bmp Vt Set:  [370 mL] 370 mL PEEP:  [8 cmH20] 8 cmH20 Plateau Pressure:  [14 cmH20-21 cmH20] 21 cmH20   Intake/Output Summary (Last 24 hours) at 08/14/2019 0856 Last data filed at 08/14/2019 0824 Gross per 24 hour  Intake 2330.1 ml  Output -  Net 2330.1 ml   Filed Weights   08/12/19 2059 08/13/19 0312 08/14/19 0500  Weight: 81.4 kg 81.1 kg 83.7 kg    Examination: General: Adult male, resting in bed, in NAD. Neuro: Sedated, not responsive. HEENT: [place]/AT. Sclerae anicteric. ETT in place. Cardiovascular: RRR, no M/R/G.  Lungs: Respirations even and unlabored.  CTA bilaterally, No W/R/R. Abdomen: BS x 4, soft, NT/ND.  Musculoskeletal: No gross deformities, no edema.  Skin: Intact, warm, no rashes.   Assessment & Plan:   Acute hypoxic respiratory failure with ARDS from COVID 19 pneumonia.  - Continue full vent support at current settings - No SBT  until O2 / PEEP support improve some - day 4 of steroids, remdesivir - Will consider starting actemra given increase in CRP (10.3 > 18.3 > 21.3).  Have discussed off label use with wife and she will call the unit back shortly. - f/u CXR  ESRD. Hyperkalemia. Hyponatremia. - HD per renal  Hx of HTN. 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 - SSI - Add tube feed coverage + lantus  Anemia of critical illness and chronic disease. - f/u CBC - transfuse for Hb < 7 or significant bleeding  Acute metabolic encephalopathy. - RASS goal -1 to -2   Best practice:  Diet: Tube feeds DVT prophylaxis: SQ heparin GI prophylaxis: Pepcid Mobility: Bedrest Code Status: Full code Disposition: ICU  CC time: 35 min.   Montey Hora, [place] [facility] 08/14/2019, 9:07 AM

## 2019-08-15 ENCOUNTER — Inpatient Hospital Stay (HOSPITAL_COMMUNITY): Payer: Medicare Other

## 2019-08-15 LAB — BASIC METABOLIC PANEL
Anion gap: 18 — ABNORMAL HIGH (ref 5–15)
BUN: 102 mg/dL — ABNORMAL HIGH (ref 6–20)
CO2: 23 mmol/L (ref 22–32)
Calcium: 7.9 mg/dL — ABNORMAL LOW (ref 8.9–10.3)
Chloride: 90 mmol/L — ABNORMAL LOW (ref 98–111)
Creatinine, Ser: 12.73 mg/dL — ABNORMAL HIGH (ref 0.61–1.24)
GFR calc Af Amer: 4 mL/min — ABNORMAL LOW (ref >60)
GFR calc non Af Amer: 4 mL/min — ABNORMAL LOW (ref >60)
Glucose, Bld: 187 mg/dL — ABNORMAL HIGH (ref 70–99)
Potassium: 5.6 mmol/L — ABNORMAL HIGH (ref 3.5–5.1)
Sodium: 131 mmol/L — ABNORMAL LOW (ref 135–145)

## 2019-08-15 LAB — CBC
HCT: 29.5 % — ABNORMAL LOW (ref 39.0–52.0)
Hemoglobin: 9.7 g/dL — ABNORMAL LOW (ref 13.0–17.0)
MCH: 33.1 pg (ref 26.0–34.0)
MCHC: 32.9 g/dL (ref 30.0–36.0)
MCV: 100.7 fL — ABNORMAL HIGH (ref 80.0–100.0)
Platelets: 190 10*3/uL (ref 150–400)
RBC: 2.93 MIL/uL — ABNORMAL LOW (ref 4.22–5.81)
RDW: 13.4 % (ref 11.5–15.5)
WBC: 7.8 10*3/uL (ref 4.0–10.5)
nRBC: 0.3 % — ABNORMAL HIGH (ref 0.0–0.2)

## 2019-08-15 LAB — HEPATIC FUNCTION PANEL
ALT: 22 U/L (ref 0–44)
AST: 31 U/L (ref 15–41)
Albumin: 2.1 g/dL — ABNORMAL LOW (ref 3.5–5.0)
Alkaline Phosphatase: 41 U/L (ref 38–126)
Bilirubin, Direct: 0.2 mg/dL (ref 0.0–0.2)
Indirect Bilirubin: 0.5 mg/dL (ref 0.3–0.9)
Total Bilirubin: 0.7 mg/dL (ref 0.3–1.2)
Total Protein: 5.6 g/dL — ABNORMAL LOW (ref 6.5–8.1)

## 2019-08-15 LAB — MAGNESIUM: Magnesium: 2.3 mg/dL (ref 1.7–2.4)

## 2019-08-15 LAB — GLUCOSE, CAPILLARY
Glucose-Capillary: 137 mg/dL — ABNORMAL HIGH (ref 70–99)
Glucose-Capillary: 169 mg/dL — ABNORMAL HIGH (ref 70–99)
Glucose-Capillary: 194 mg/dL — ABNORMAL HIGH (ref 70–99)
Glucose-Capillary: 264 mg/dL — ABNORMAL HIGH (ref 70–99)
Glucose-Capillary: 271 mg/dL — ABNORMAL HIGH (ref 70–99)
Glucose-Capillary: 351 mg/dL — ABNORMAL HIGH (ref 70–99)
Glucose-Capillary: 382 mg/dL — ABNORMAL HIGH (ref 70–99)
Glucose-Capillary: 65 mg/dL — ABNORMAL LOW (ref 70–99)

## 2019-08-15 LAB — FERRITIN: Ferritin: 2943 ng/mL — ABNORMAL HIGH (ref 24–336)

## 2019-08-15 LAB — PHOSPHORUS: Phosphorus: 5.9 mg/dL — ABNORMAL HIGH (ref 2.5–4.6)

## 2019-08-15 LAB — C-REACTIVE PROTEIN: CRP: 22.2 mg/dL — ABNORMAL HIGH (ref <1.0)

## 2019-08-15 MED ORDER — CHLORHEXIDINE GLUCONATE CLOTH 2 % EX PADS: 6.0000 | MEDICATED_PAD | Freq: Every day | CUTANEOUS | Status: DC

## 2019-08-15 MED ORDER — ALTEPLASE 2 MG IJ SOLR
2.0000 mg | Freq: Once | INTRAMUSCULAR | Status: DC | PRN
  Filled 2019-08-15: qty 2

## 2019-08-15 MED ORDER — DILTIAZEM HCL-DEXTROSE 125-5 MG/125ML-% IV SOLN (PREMIX)
5.0000 mg/h | INTRAVENOUS | Status: DC
  Administered 2019-08-15: 5 mg/h via INTRAVENOUS
  Filled 2019-08-15 (×2): qty 125

## 2019-08-15 MED ORDER — SODIUM ZIRCONIUM CYCLOSILICATE 10 G PO PACK
10.0000 g | PACK | Freq: Every day | ORAL | Status: DC
  Administered 2019-08-15: 10 g via ORAL
  Filled 2019-08-15: qty 1

## 2019-08-15 MED ORDER — GLYCOPYRROLATE 0.2 MG/ML IJ SOLN
0.1000 mg | Freq: Once | INTRAMUSCULAR | Status: AC
  Administered 2019-08-15: 0.1 mg via INTRAVENOUS
  Filled 2019-08-15: qty 1

## 2019-08-15 MED ORDER — DEXTROSE 50 % IV SOLN
INTRAVENOUS | Status: AC
  Administered 2019-08-15: 25 mL
  Filled 2019-08-15: qty 50

## 2019-08-15 MED ORDER — PRISMASOL BGK 4/2.5 32-4-2.5 MEQ/L REPLACEMENT SOLN
Status: DC
  Filled 2019-08-15: qty 5000

## 2019-08-15 MED ORDER — ASPIRIN 81 MG PO CHEW
81.0000 mg | CHEWABLE_TABLET | Freq: Every day | ORAL | Status: DC
  Administered 2019-08-16: 81 mg via ORAL
  Filled 2019-08-15: qty 1

## 2019-08-15 MED ORDER — DILTIAZEM LOAD VIA INFUSION
20.0000 mg | Freq: Once | INTRAVENOUS | Status: AC
  Administered 2019-08-15: 20 mg via INTRAVENOUS
  Filled 2019-08-15: qty 20

## 2019-08-15 MED ORDER — SODIUM CHLORIDE 0.9 % FOR CRRT
INTRAVENOUS_CENTRAL | Status: DC | PRN
  Filled 2019-08-15: qty 1000

## 2019-08-15 MED ORDER — PRISMASOL BGK 4/2.5 32-4-2.5 MEQ/L IV SOLN
INTRAVENOUS | Status: DC
  Filled 2019-08-15 (×5): qty 5000

## 2019-08-15 MED ORDER — HEPARIN SODIUM (PORCINE) 1000 UNIT/ML DIALYSIS
1000.0000 [IU] | INTRAMUSCULAR | Status: DC | PRN
  Filled 2019-08-15: qty 6

## 2019-08-15 NOTE — Progress Notes (Signed)
Patient K+ 5.9. [place] notified. Elink states patient is to have dialysis today. No acute distress. Will continue to monitor.

## 2019-08-15 NOTE — Progress Notes (Signed)
Hypoglycemic Event  CBG: 65  Treatment: D50 25 mL (12.5 gm)  Symptoms: None  Follow-up CBG: Time: 0025 CBG Result: 137  Possible Reasons for Event: Unknown  Comments/MD notified: Protocol followed. Elink notified after treatment.    Jonathan Horton

## 2019-08-15 NOTE — Progress Notes (Signed)
   NAMEDamar Horton, MRN:  [id number], DOB:  Sep 26, 1960, LOS: 4 ADMISSION DATE:  07/28/2019, CONSULTATION DATE:  08/07/2019 REFERRING MD:  Dr Lorin Mercy, CHIEF COMPLAINT:  Covid pneumonia   Brief History   58 yo male presented with dyspnea, fever.  Had COVID 19 testing at urgent care center 1 day prior to admission and positive.  Symptoms rapidly progressed, presented to ER and required intubation for COVID 19 pneumonia.  Past Medical History  ESRD, DM, HTN  Significant Hospital Events   11/21 Admit  Consults:  Nephrology  Procedures:  ETT 11/21 >>   Significant Diagnostic Tests:  LDH - 207 Ferritin - 1993>>2943 CRP - 18.3 >>22.2 Cr - 12.72 K- 6.1>>6.9>5.6 CXR - Stable bilateral lung opacities are noted concerning for edema or pneumonia.  Micro Data:  SARS CoV2 11/21 >> detected Blood 11/21 >> NG3D  Antimicrobials:  Steroids 11/21 >> Remdesivir 11/21 >>   Interim history/subjective:  Ventilated and sedated.  Overnight fentanyl ceiling increased for ventilator dyssynchrony and pt appearing uncomfortable.   Objective   Blood pressure 120/61, pulse 91, temperature 98.8 F (37.1 C), temperature source Oral, resp. rate (!) 22, height 5\' 11"  (1.803 m), weight 86.2 kg, SpO2 100 %.    Vent Mode: PRVC FiO2 (%):  [50 %] 50 % Set Rate:  [28 bmp] 28 bmp Vt Set:  [370 mL] 370 mL PEEP:  [8 cmH20] 8 cmH20 Plateau Pressure:  [16 cmH20-18 cmH20] 18 cmH20   Intake/Output Summary (Last 24 hours) at 08/15/2019 0902 Last data filed at 08/15/2019 0700 Gross per 24 hour  Intake 2417.36 ml  Output 1205 ml  Net 1212.36 ml   Filed Weights   08/13/19 0312 08/14/19 0500 08/15/19 0333  Weight: 81.1 kg 83.7 kg 86.2 kg    Examination: General: intubated and sedated. Does not appear in distress.  Neuro: sedated. Unresponsive.  HEENT: NCAT. ETT in place.  Cardiovascular: RRR. No murmurs.  Lungs: mechanical breath sounds. No inspiratory crackles.  Abdomen: nontender. Normal bowel  sounds.  Musculoskeletal: no edema.  Skin: warm and dry.    Assessment & Plan:   Acute hypoxic respiratory failure with ARDS from COVID 19 pneumonia. - on ventilator. FiO2 improving, now 50% - Continue full vent support at current settings - No SBT until O2 / PEEP support improve some -  steroids, remdesivir (11/21 - ) - given actemra after discussion with family.  - consider convalescent plasma if pt not improving.   ESRD. -  Hyperkalemia. Hyponatremia. - HD per renal  Hx of HTN. 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 - d/c tube feed coverage - SSI  and lantus 20 U qhs  Anemia of critical illness and chronic disease. - f/u CBC - transfuse for Hb < 7 or significant bleeding  Acute metabolic encephalopathy. - currently sedated with propofol and fentanyl.   - RASS goal -1 to -2   Best practice:  Diet: Tube feeds DVT prophylaxis: SQ heparin GI prophylaxis: Pepcid Mobility: Bedrest Code Status: Full code Disposition: ICU  CC time: 35 min.   Clemetine Marker, MD PGY-2 Family medicine residency 08/15/2019, 9:02 AM

## 2019-08-15 NOTE — Progress Notes (Signed)
Informed floor Rn  Charmayne Sheer that HD Tx is reschedule for 08/20/2019 per Dr. Jonnie Finner.

## 2019-08-15 NOTE — Progress Notes (Signed)
eLink Physician-Brief Progress Note Patient Name: Jonathan Horton DOB: 1961/08/20 MRN: [id number]   Date of Service  08/15/2019  HPI/Events of Note  Pt grimacing as if uncomfortable, and also dyssynchronous on the ventilator.  eICU Interventions  Ceiling on fentanyl infusion increased to 400 mcg        Okoronkwo U Ogan 08/15/2019, 2:01 AM

## 2019-08-15 NOTE — Progress Notes (Addendum)
[facility] Progress Note  Subjective:  Patient not examined today directly given COVID-19 + status.  K+ still up, B/Cr up, wt's up 5kg from admit and I/O +5-6L from admit   Vitals:   08/15/19 0932 08/15/19 1000 08/15/19 1024 08/15/19 1100  BP: (!) 109/55 (!) 111/58 (!) 111/58 (!) 131/59  Pulse: 87 87 90 92  Resp: (!) 31 (!) 28 (!) 21 18  Temp:      TempSrc:      SpO2: 100% 100% 100% 99%  Weight:      Height: 5' 11"  (1.803 m)       Inpatient medications: . [START ON 08/24/2019] aspirin  81 mg Oral Daily  . chlorhexidine gluconate (MEDLINE KIT)  15 mL Mouth Rinse BID  . Chlorhexidine Gluconate Cloth  6 each Topical Q0600  . docusate  100 mg Per Tube BID  . doxercalciferol  7 mcg Intravenous Q T,Th,Sa-HD  . famotidine  20 mg Per Tube QHS  . heparin  7,500 Units Subcutaneous Q8H  . insulin aspart  0-15 Units Subcutaneous Q4H  . insulin glargine  20 Units Subcutaneous Daily  . mouth rinse  15 mL Mouth Rinse 10 times per day  . methylPREDNISolone (SOLU-MEDROL) injection  0.5 mg/kg Intravenous Q12H  . multivitamin  15 mL Per Tube Daily  . sennosides  10 mL Per Tube Daily  . sodium chloride flush  3 mL Intravenous Q12H  . sodium zirconium cyclosilicate  10 g Oral Daily  . sucroferric oxyhydroxide  500 mg Oral TID WC   . sodium chloride    . sodium chloride Stopped (08/13/19 0500)  . feeding supplement (VITAL AF 1.2 CAL) Stopped (08/15/19 1016)  . fentaNYL infusion INTRAVENOUS 200 mcg/hr (08/15/19 1005)  . propofol (DIPRIVAN) infusion 30 mcg/kg/min (08/15/19 1011)  . remdesivir 100 mg in NS 250 mL Stopped (08/14/19 2149)   sodium chloride, acetaminophen (TYLENOL) oral liquid 160 mg/5 mL, albuterol, alteplase, calcium carbonate (dosed in mg elemental calcium), camphor-menthol **AND** hydrOXYzine, docusate sodium, feeding supplement (NEPRO CARB STEADY), fentaNYL, guaiFENesin-dextromethorphan, heparin, heparin, lidocaine (PF), lidocaine-prilocaine, [DISCONTINUED]  ondansetron **OR** ondansetron (ZOFRAN) IV, pentafluoroprop-tetrafluoroeth, sodium chloride flush    Exam:  Patient not examined today directly given COVID-19 + status, utilizing exam of the primary team and observations of RN's.        Dialysis: [place] MWF   4h  81.5kg  400/ 1.5   P4  Heparin none  Hectorol 7 mcg q rx Mircera 75 mcg q 4 weeks, last given 11/2 Binders are Velphoro 2 TID [facility]  Assessment/ Plan: 1.  COVID +: hypoxic, intubated, getting remdesivir.   2.  ESRD: MWF HD. Will try HD w/ pressor support today, may need CRRT ultimately given BP issues we are having w/ regular HD.   3.  BP/volume: I>> O, wt's are up  4.  Anemia: Last ESA given 11/2, Hb 9.7 today, follow  5.  Bone/ mineral: velphoro 2 TID AC as binder when getting TF, continue renavite as binder  6.  Nutrition: Last albumin 3.9    Rob Scotlyn Mccranie 08/15/2019, 11:49 AM  Iron/TIBC/Ferritin/ %Sat    Component Value Date/Time   IRON 72 06/08/2016 1707   TIBC 224 (L) 06/08/2016 1707   FERRITIN 2,943 (H) 08/15/2019 0410   IRONPCTSAT 32 06/08/2016 1707   Recent Labs  Lab 08/15/19 0410  NA 131*  K 5.6*  CL 90*  CO2 23  GLUCOSE 187*  BUN 102*  CREATININE 12.73*  CALCIUM 7.9*  PHOS 5.9*  ALBUMIN 2.1*   Recent Labs  Lab 08/15/19 0410  AST 31  ALT 22  ALKPHOS 41  BILITOT 0.7  PROT 5.6*   Recent Labs  Lab 08/15/19 0410  WBC 7.8  HGB 9.7*  HCT 29.5*  PLT 190

## 2019-08-15 NOTE — Progress Notes (Addendum)
Pt lantus increased to 20 units and tube feed coverage ordered during day shift. TF continuous throughout shifts.   2000 - CBG check 94, TF coverage given.  0000 - CBG 65, insulin held and 12.5gm D50 given. Recheck CBG 137.  0400 - CBG 169, TF coverage held. SSI given.   Patient having episodes of vent dyssynchrony and coughing throughout shift. Appears to be grimacing. Increased sedation with no change. Foaming oral secretions noted and suctioned. Patient appeared to be more comfortable after this. Order placed to help with oral secretions.   Patient had temp >100F at 2000 and 0000. Tylenol given at 2000, room temp decreased and excess blankets removed.Temp at 0315 - 99.33F. Ice packs applied. Patient continues to be diaphoretic.  Elink updated throughout shift. Will continue to monitor.

## 2019-08-15 NOTE — Progress Notes (Signed)
Pt remains in afib rvr between 110-140.  Informed Dr. Nelda Marseille.  Irven Baltimore, RN

## 2019-08-15 NOTE — Progress Notes (Signed)
While turning pt pt converted to afib RVR.  Informed Dr. Halford Chessman.  Restarted sedation and had RT switch pt back to full support on the vent.  Will continue to monitor.  Irven Baltimore, RN

## 2019-08-15 NOTE — Progress Notes (Signed)
Developed A fib with RVR.  Will start cardizem gtt.  Chesley Mires, MD [facility]monary/Critical Care 08/15/2019, 6:27 PM

## 2019-08-15 NOTE — Progress Notes (Signed)
Morning CBG at 0800 was 196.  Irven Baltimore, RN

## 2019-08-15 NOTE — Progress Notes (Signed)
Video call with wife 

## 2019-08-15 NOTE — Progress Notes (Signed)
[facility] Progress Note Patient Name: Jonathan Horton DOB: 1961-01-26 MRN: [id number]   Date of Service  08/15/2019  HPI/Events of Note  Pt with excessive oral secretions  eICU Interventions  Robinul 0.1 mg iv x 1        Okoronkwo U Ogan 08/15/2019, 3:03 AM

## 2019-08-16 ENCOUNTER — Inpatient Hospital Stay (HOSPITAL_COMMUNITY): Payer: Medicare Other

## 2019-08-16 LAB — BASIC METABOLIC PANEL
Anion gap: 29 — ABNORMAL HIGH (ref 5–15)
BUN: 167 mg/dL — ABNORMAL HIGH (ref 6–20)
CO2: 13 mmol/L — ABNORMAL LOW (ref 22–32)
Calcium: 8.1 mg/dL — ABNORMAL LOW (ref 8.9–10.3)
Chloride: 87 mmol/L — ABNORMAL LOW (ref 98–111)
Creatinine, Ser: 16.5 mg/dL — ABNORMAL HIGH (ref 0.61–1.24)
GFR calc Af Amer: 3 mL/min — ABNORMAL LOW (ref >60)
GFR calc non Af Amer: 3 mL/min — ABNORMAL LOW (ref >60)
Glucose, Bld: 274 mg/dL — ABNORMAL HIGH (ref 70–99)
Potassium: >7.5 mmol/L (ref 3.5–5.1)
Sodium: 129 mmol/L — ABNORMAL LOW (ref 135–145)

## 2019-08-16 LAB — CBC
HCT: 29.3 % — ABNORMAL LOW (ref 39.0–52.0)
Hemoglobin: 10 g/dL — ABNORMAL LOW (ref 13.0–17.0)
MCH: 34 pg (ref 26.0–34.0)
MCHC: 34.1 g/dL (ref 30.0–36.0)
MCV: 99.7 fL (ref 80.0–100.0)
Platelets: 225 10*3/uL (ref 150–400)
RBC: 2.94 MIL/uL — ABNORMAL LOW (ref 4.22–5.81)
RDW: 14.1 % (ref 11.5–15.5)
WBC: 7.9 10*3/uL (ref 4.0–10.5)
nRBC: 0.8 % — ABNORMAL HIGH (ref 0.0–0.2)

## 2019-08-16 LAB — TRIGLYCERIDES: Triglycerides: 920 mg/dL — ABNORMAL HIGH (ref <150)

## 2019-08-16 LAB — RENAL FUNCTION PANEL
Albumin: 2 g/dL — ABNORMAL LOW (ref 3.5–5.0)
Anion gap: 27 — ABNORMAL HIGH (ref 5–15)
BUN: 162 mg/dL — ABNORMAL HIGH (ref 6–20)
CO2: 17 mmol/L — ABNORMAL LOW (ref 22–32)
Calcium: 8.1 mg/dL — ABNORMAL LOW (ref 8.9–10.3)
Chloride: 87 mmol/L — ABNORMAL LOW (ref 98–111)
Creatinine, Ser: 15.71 mg/dL — ABNORMAL HIGH (ref 0.61–1.24)
GFR calc Af Amer: 3 mL/min — ABNORMAL LOW (ref >60)
GFR calc non Af Amer: 3 mL/min — ABNORMAL LOW (ref >60)
Glucose, Bld: 281 mg/dL — ABNORMAL HIGH (ref 70–99)
Phosphorus: 10.8 mg/dL — ABNORMAL HIGH (ref 2.5–4.6)
Potassium: >7.5 mmol/L (ref 3.5–5.1)
Sodium: 131 mmol/L — ABNORMAL LOW (ref 135–145)

## 2019-08-16 LAB — CULTURE, BLOOD (ROUTINE X 2)
Culture: NO GROWTH
Culture: NO GROWTH
Special Requests: ADEQUATE

## 2019-08-16 LAB — GLUCOSE, CAPILLARY
Glucose-Capillary: 239 mg/dL — ABNORMAL HIGH (ref 70–99)
Glucose-Capillary: 308 mg/dL — ABNORMAL HIGH (ref 70–99)

## 2019-08-16 MED ORDER — EPINEPHRINE 1 MG/10ML IJ SOSY
PREFILLED_SYRINGE | INTRAMUSCULAR | Status: AC
  Administered 2019-08-16: 1 mg
  Filled 2019-08-16: qty 10

## 2019-08-16 MED ORDER — ATROPINE SULFATE 1 MG/10ML IJ SOSY
PREFILLED_SYRINGE | INTRAMUSCULAR | Status: AC
  Administered 2019-08-16: 1 mg
  Filled 2019-08-16: qty 10

## 2019-08-16 MED ORDER — INSULIN GLARGINE 100 UNIT/ML ~~LOC~~ SOLN
30.0000 [IU] | Freq: Every day | SUBCUTANEOUS | Status: DC
  Administered 2019-08-16: 30 [IU] via SUBCUTANEOUS
  Filled 2019-08-16: qty 0.3

## 2019-08-21 NOTE — Progress Notes (Signed)
Time of death called at 36 by Dr. Tamala Julian. Epi and Atropine administered with no results. Dr. Tamala Julian notifying family.

## 2019-08-21 NOTE — Discharge Summary (Signed)
Date of Admission: 08/18/2019 Date of Death: 09-09-19 Diagnoses: COVID Pneumonia, ESRD, DM Hospital Course: Patient was admitted with COVID pneumonia.  Unfortunately despite maximal aggressive efforts he passed away from his illness on the morning of 09-09-2019.  Erskine Emery MD PCCM

## 2019-08-21 NOTE — Progress Notes (Signed)
   NAMEBrad Horton, MRN:  [id number], DOB:  Oct 09, 1960, LOS: 5 ADMISSION DATE:  08/04/2019, CONSULTATION DATE:  08/15/2019 REFERRING MD:  Dr Lorin Mercy, CHIEF COMPLAINT:  Covid pneumonia   Brief History   58 yo male presented with dyspnea, fever.  Had COVID 19 testing at urgent care center 1 day prior to admission and positive.  Symptoms rapidly progressed, presented to ER and required intubation for COVID 19 pneumonia.  Past Medical History  ESRD, DM, HTN  Significant Hospital Events   11/21 Admit  Consults:  Nephrology  Procedures:  ETT 11/21 >>   Significant Diagnostic Tests:  LDH - 207 Ferritin - 1993>>2943 CRP - 18.3 >>22.2 Cr - 12.72 K- 6.1>>6.9>5.6 CXR - Stable bilateral lung opacities are noted concerning for edema or pneumonia.  Micro Data:  SARS CoV2 11/21 >> detected Blood 11/21 >> NG3D  Antimicrobials:  Steroids 11/21 >> Remdesivir 11/21 >>   Interim history/subjective:  Ventilated, sedated On dilt drip for afib/RVR Markedly distended abdomen, apparently has not had BM since admission  Objective   Blood pressure (!) 111/55, pulse 95, temperature (!) 100.6 F (38.1 C), temperature source Oral, resp. rate 20, height 5\' 11"  (1.803 m), weight 87.4 kg, SpO2 99 %.    Vent Mode: PRVC FiO2 (%):  [40 %] 40 % Set Rate:  [28 bmp] 28 bmp Vt Set:  [370 mL] 370 mL PEEP:  [5 cmH20-8 cmH20] 5 cmH20 Pressure Support:  [8 cmH20] 8 cmH20 Plateau Pressure:  [15 cmH20-20 cmH20] 15 cmH20   Intake/Output Summary (Last 24 hours) at 08-30-2019 0703 Last data filed at Aug 30, 2019 0600 Gross per 24 hour  Intake 2009.54 ml  Output -  Net 2009.54 ml   Filed Weights   08/14/19 0500 08/15/19 0333 2019-08-30 0205  Weight: 83.7 kg 86.2 kg 87.4 kg    Examination: GEN: middle aged man on vnet HEENT: ETT in place, small amount serosanguinous sputum on suctioning CV: Irregular, ext warm PULM: Diminished at bases, triggering vent GI: Distended, hypoactive bS EXT: Minimal edema  NEURO: heavily sedated PSYCH: deferred SKIN: No rashes  CXR ?? Loculated R effusion, volume loss on R  Assessment & Plan:   # Acute hypoxic respiratory failure with ARDS from COVID 19 pneumonia. - on ventilator. FiO2 improving, now 50%.  S/p steroids, remdesivir, actemra. # ESRD, HD dependent # Hx HTN, DM # Abdominal distension, concern for ileus  - Stat KUB, likely will need LIS and neostigmine vs. reglan + suppositories - Low threshold to switch to amiodarone, CCB can be associated with worsened ileus - There is no reason to continue to trend inflammatory labs other than maybe D dimer - iHD per nephrology - Will check [place] of R chest - Continue supportive care - Hold on extubation until HR and ileus have been addressed  Best practice:  Diet: Tube feeds DVT prophylaxis: SQ heparin GI prophylaxis: Pepcid Mobility: Bedrest Code Status: Full code Disposition: ICU  CC time: 32 min.  Erskine Emery MD PCCM

## 2019-08-21 NOTE — Progress Notes (Signed)
CRITICAL VALUE ALERT  Critical Value:  K+ >7.5  Date & Time Notied:  09/11/19 @ 0634  Provider Notified: Dr. Erskine Emery  Orders Received/Actions taken: Orders received

## 2019-08-21 NOTE — Progress Notes (Signed)
Pt was turned and then went into PEA arrest, MD at bedside.

## 2019-08-21 NOTE — Progress Notes (Addendum)
Patient mental status poor all morning despite sedation wean.  GCS 3, attempting to wait a bit before head CT to see if this was sedation washing out of system.  AM labs showing high K with initial suspicion for hemolysis, repeat stat labs were ordered.  Patient then was turned and went into PEA arrest refractory to atropine, epinephrine. Patient pronounced dead at 10:10 AM due to futility.  Have called and updated wife who is coming in.  Erskine Emery MD PCCM

## 2019-08-21 DEATH — deceased

## 2019-08-23 ENCOUNTER — Telehealth: Payer: Self-pay

## 2019-08-23 NOTE — Telephone Encounter (Signed)
08/23/19 Recd signed DC from Dr. Tamala Julian. Faxed copy to [facility] and called them to pickup original.

## 2019-08-27 ENCOUNTER — Telehealth: Payer: Self-pay

## 2019-08-27 NOTE — Telephone Encounter (Signed)
Received dc back from [employer].  Patient is a patient of Doctor Tamala Julian.   Dc will be taken to [facility] for signature.

## 2020-09-20 IMAGING — DX DG CHEST 1V PORT
1 series · 1 of 1 positions shown · non-contrast
Comparison: Radiograph 08/15/2019

CLINICAL DATA: Respiratory failure

EXAM:
PORTABLE CHEST 1 VIEW

[chest ap]
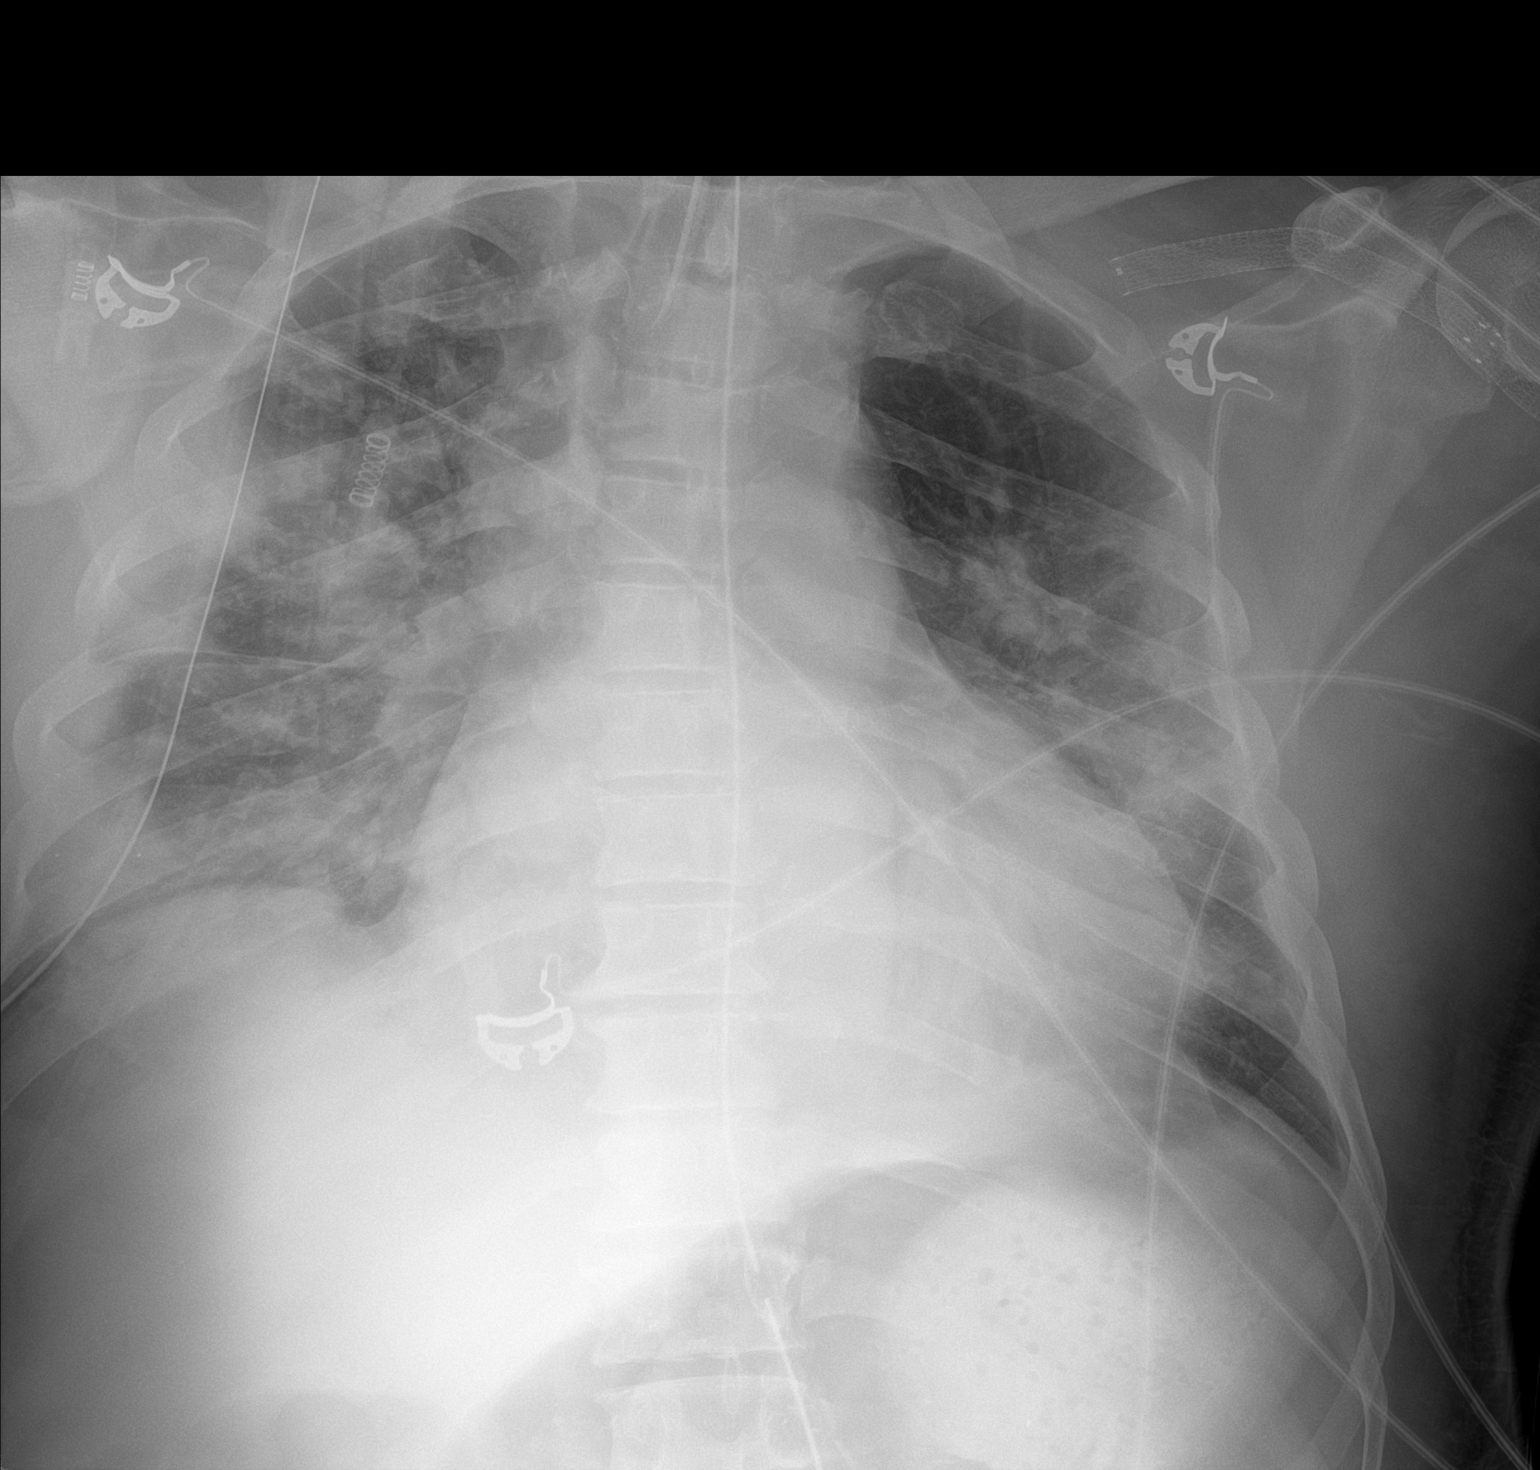

[1 of 1 positions shown; findings below may reference images not displayed]

FINDINGS: Endotracheal tube and NG tube unchanged.  Stable cardiac silhouette.

There is bilateral patchy airspace disease similar prior. Slight
increase in effusion along the RIGHT lateral chest wall.
IMPRESSION: 1. Minimal interval change.
2. Stable support apparatus.
3. Patchy bilateral airspace disease consistent with multifocal
pneumonia.
4. Potential increase in RIGHT pleural effusion.
